# Patient Record
Sex: Female | Born: 1970 | Race: Black or African American | Hispanic: No | Marital: Married | Smoking: Former smoker
Health system: Southern US, Community
[De-identification: ages and names within clinical notes are randomized; demographics above are authoritative.]

## PROBLEM LIST (undated history)

## (undated) DIAGNOSIS — N186 End stage renal disease: Secondary | ICD-10-CM

## (undated) DIAGNOSIS — G510 Bell's palsy: Secondary | ICD-10-CM

## (undated) DIAGNOSIS — I2510 Atherosclerotic heart disease of native coronary artery without angina pectoris: Secondary | ICD-10-CM

## (undated) DIAGNOSIS — E669 Obesity, unspecified: Secondary | ICD-10-CM

## (undated) DIAGNOSIS — I10 Essential (primary) hypertension: Principal | ICD-10-CM

## (undated) DIAGNOSIS — D649 Anemia, unspecified: Secondary | ICD-10-CM

## (undated) DIAGNOSIS — R0602 Shortness of breath: Secondary | ICD-10-CM

## (undated) DIAGNOSIS — E119 Type 2 diabetes mellitus without complications: Secondary | ICD-10-CM

## (undated) DIAGNOSIS — D259 Leiomyoma of uterus, unspecified: Secondary | ICD-10-CM

## (undated) DIAGNOSIS — E11359 Type 2 diabetes mellitus with proliferative diabetic retinopathy without macular edema: Secondary | ICD-10-CM

## (undated) HISTORY — DX: Type 2 diabetes mellitus without complications: E11.9

## (undated) HISTORY — DX: Essential (primary) hypertension: I10

## (undated) HISTORY — DX: Bell's palsy: G51.0

## (undated) HISTORY — PX: UTERINE FIBROID SURGERY: SHX826

## (undated) HISTORY — DX: Obesity, unspecified: E66.9

## (undated) HISTORY — PX: ABDOMINAL HYSTERECTOMY: SHX81

## (undated) HISTORY — DX: Type 2 diabetes mellitus with proliferative diabetic retinopathy without macular edema: E11.359

---

## 1997-05-21 ENCOUNTER — Encounter: Admission: RE | Admit: 1997-05-21 | Discharge: 1997-08-19 | Payer: Self-pay | Admitting: Family Medicine

## 1998-02-19 ENCOUNTER — Encounter: Admission: RE | Admit: 1998-02-19 | Discharge: 1998-02-19 | Payer: Self-pay | Admitting: Obstetrics

## 1998-02-19 ENCOUNTER — Other Ambulatory Visit: Admission: RE | Admit: 1998-02-19 | Discharge: 1998-02-19 | Payer: Self-pay | Admitting: Obstetrics & Gynecology

## 1998-03-08 ENCOUNTER — Ambulatory Visit (HOSPITAL_COMMUNITY): Admission: RE | Admit: 1998-03-08 | Discharge: 1998-03-08 | Payer: Self-pay | Admitting: Obstetrics & Gynecology

## 2000-01-24 HISTORY — PX: ABCESS DRAINAGE: SHX399

## 2000-07-21 ENCOUNTER — Emergency Department (HOSPITAL_COMMUNITY): Admission: EM | Admit: 2000-07-21 | Discharge: 2000-07-21 | Payer: Self-pay | Admitting: Emergency Medicine

## 2000-07-23 ENCOUNTER — Ambulatory Visit (HOSPITAL_COMMUNITY): Admission: AD | Admit: 2000-07-23 | Discharge: 2000-07-23 | Payer: Self-pay

## 2002-04-22 ENCOUNTER — Encounter: Admission: RE | Admit: 2002-04-22 | Discharge: 2002-07-21 | Payer: Self-pay | Admitting: Internal Medicine

## 2002-07-30 ENCOUNTER — Emergency Department (HOSPITAL_COMMUNITY): Admission: EM | Admit: 2002-07-30 | Discharge: 2002-07-30 | Payer: Self-pay | Admitting: Emergency Medicine

## 2003-03-05 ENCOUNTER — Emergency Department (HOSPITAL_COMMUNITY): Admission: EM | Admit: 2003-03-05 | Discharge: 2003-03-05 | Payer: Self-pay | Admitting: Emergency Medicine

## 2003-06-10 ENCOUNTER — Emergency Department (HOSPITAL_COMMUNITY): Admission: EM | Admit: 2003-06-10 | Discharge: 2003-06-10 | Payer: Self-pay

## 2003-07-08 ENCOUNTER — Emergency Department (HOSPITAL_COMMUNITY): Admission: EM | Admit: 2003-07-08 | Discharge: 2003-07-08 | Payer: Self-pay | Admitting: Emergency Medicine

## 2004-01-24 ENCOUNTER — Emergency Department (HOSPITAL_COMMUNITY): Admission: EM | Admit: 2004-01-24 | Discharge: 2004-01-24 | Payer: Self-pay | Admitting: Emergency Medicine

## 2005-04-20 ENCOUNTER — Emergency Department (HOSPITAL_COMMUNITY): Admission: EM | Admit: 2005-04-20 | Discharge: 2005-04-20 | Payer: Self-pay | Admitting: Emergency Medicine

## 2005-04-26 ENCOUNTER — Inpatient Hospital Stay (HOSPITAL_COMMUNITY): Admission: EM | Admit: 2005-04-26 | Discharge: 2005-04-27 | Payer: Self-pay | Admitting: Emergency Medicine

## 2006-08-13 ENCOUNTER — Emergency Department: Payer: Self-pay | Admitting: Emergency Medicine

## 2006-09-24 ENCOUNTER — Emergency Department: Payer: Self-pay | Admitting: Emergency Medicine

## 2006-09-27 ENCOUNTER — Emergency Department: Payer: Self-pay | Admitting: Emergency Medicine

## 2007-11-19 ENCOUNTER — Emergency Department (HOSPITAL_COMMUNITY): Admission: EM | Admit: 2007-11-19 | Discharge: 2007-11-19 | Payer: Self-pay | Admitting: Emergency Medicine

## 2008-01-05 ENCOUNTER — Emergency Department (HOSPITAL_COMMUNITY): Admission: EM | Admit: 2008-01-05 | Discharge: 2008-01-05 | Payer: Self-pay | Admitting: Emergency Medicine

## 2008-03-16 ENCOUNTER — Ambulatory Visit: Payer: Self-pay | Admitting: Internal Medicine

## 2008-03-16 DIAGNOSIS — E669 Obesity, unspecified: Secondary | ICD-10-CM

## 2008-03-16 DIAGNOSIS — E119 Type 2 diabetes mellitus without complications: Secondary | ICD-10-CM

## 2008-03-16 HISTORY — DX: Obesity, unspecified: E66.9

## 2008-03-16 HISTORY — DX: Type 2 diabetes mellitus without complications: E11.9

## 2008-04-15 ENCOUNTER — Encounter: Admission: RE | Admit: 2008-04-15 | Discharge: 2008-04-15 | Payer: Self-pay | Admitting: Internal Medicine

## 2008-04-16 ENCOUNTER — Encounter: Payer: Self-pay | Admitting: Internal Medicine

## 2008-04-17 ENCOUNTER — Ambulatory Visit: Payer: Self-pay | Admitting: Internal Medicine

## 2008-04-17 LAB — CONVERTED CEMR LAB: Blood Glucose, Fingerstick: 148

## 2008-05-12 ENCOUNTER — Ambulatory Visit: Payer: Self-pay | Admitting: Family Medicine

## 2008-05-12 DIAGNOSIS — G510 Bell's palsy: Secondary | ICD-10-CM | POA: Insufficient documentation

## 2008-05-12 HISTORY — DX: Bell's palsy: G51.0

## 2008-05-13 ENCOUNTER — Telehealth: Payer: Self-pay | Admitting: Internal Medicine

## 2008-05-14 ENCOUNTER — Encounter: Payer: Self-pay | Admitting: Internal Medicine

## 2008-05-14 ENCOUNTER — Telehealth: Payer: Self-pay | Admitting: Internal Medicine

## 2009-03-10 ENCOUNTER — Ambulatory Visit: Payer: Self-pay | Admitting: Internal Medicine

## 2009-03-10 LAB — CONVERTED CEMR LAB: Blood Glucose, Fingerstick: 161

## 2009-04-02 ENCOUNTER — Telehealth: Payer: Self-pay | Admitting: Internal Medicine

## 2009-04-23 ENCOUNTER — Ambulatory Visit: Payer: Self-pay | Admitting: Endocrinology

## 2009-04-23 DIAGNOSIS — E113599 Type 2 diabetes mellitus with proliferative diabetic retinopathy without macular edema, unspecified eye: Secondary | ICD-10-CM | POA: Insufficient documentation

## 2009-04-23 DIAGNOSIS — E11359 Type 2 diabetes mellitus with proliferative diabetic retinopathy without macular edema: Secondary | ICD-10-CM

## 2009-04-23 HISTORY — DX: Type 2 diabetes mellitus with proliferative diabetic retinopathy without macular edema: E11.359

## 2009-04-23 LAB — CONVERTED CEMR LAB: Fructosamine: 206 micromoles/L (ref <285)

## 2009-04-30 ENCOUNTER — Telehealth: Payer: Self-pay | Admitting: Endocrinology

## 2009-05-31 ENCOUNTER — Telehealth: Payer: Self-pay | Admitting: Internal Medicine

## 2009-06-29 ENCOUNTER — Ambulatory Visit: Payer: Self-pay | Admitting: Internal Medicine

## 2009-06-29 DIAGNOSIS — R609 Edema, unspecified: Secondary | ICD-10-CM | POA: Insufficient documentation

## 2009-06-29 LAB — CONVERTED CEMR LAB: Blood Glucose, Fingerstick: 207

## 2009-07-06 ENCOUNTER — Ambulatory Visit: Payer: Self-pay | Admitting: Internal Medicine

## 2009-07-06 DIAGNOSIS — I8310 Varicose veins of unspecified lower extremity with inflammation: Secondary | ICD-10-CM | POA: Insufficient documentation

## 2009-08-04 ENCOUNTER — Telehealth: Payer: Self-pay | Admitting: Internal Medicine

## 2009-09-20 ENCOUNTER — Telehealth: Payer: Self-pay | Admitting: Internal Medicine

## 2009-11-30 ENCOUNTER — Ambulatory Visit: Payer: Self-pay | Admitting: Internal Medicine

## 2009-12-18 ENCOUNTER — Emergency Department (HOSPITAL_COMMUNITY): Admission: EM | Admit: 2009-12-18 | Discharge: 2009-12-18 | Payer: Self-pay | Admitting: Emergency Medicine

## 2009-12-20 ENCOUNTER — Inpatient Hospital Stay (HOSPITAL_COMMUNITY)
Admission: AD | Admit: 2009-12-20 | Discharge: 2009-12-20 | Payer: Self-pay | Source: Home / Self Care | Admitting: Obstetrics & Gynecology

## 2009-12-20 ENCOUNTER — Telehealth: Payer: Self-pay | Admitting: Internal Medicine

## 2009-12-21 ENCOUNTER — Inpatient Hospital Stay (HOSPITAL_COMMUNITY): Admission: AD | Admit: 2009-12-21 | Discharge: 2009-12-21 | Payer: Self-pay | Admitting: Obstetrics

## 2009-12-29 ENCOUNTER — Other Ambulatory Visit
Admission: RE | Admit: 2009-12-29 | Discharge: 2009-12-29 | Payer: Self-pay | Source: Home / Self Care | Admitting: Gynecologic Oncology

## 2009-12-29 ENCOUNTER — Ambulatory Visit
Admission: RE | Admit: 2009-12-29 | Discharge: 2009-12-29 | Payer: Self-pay | Source: Home / Self Care | Admitting: Gynecologic Oncology

## 2010-01-06 ENCOUNTER — Encounter: Payer: Self-pay | Admitting: Internal Medicine

## 2010-01-06 ENCOUNTER — Ambulatory Visit: Payer: Self-pay | Admitting: Internal Medicine

## 2010-01-06 DIAGNOSIS — I10 Essential (primary) hypertension: Secondary | ICD-10-CM | POA: Insufficient documentation

## 2010-01-06 HISTORY — DX: Essential (primary) hypertension: I10

## 2010-01-06 LAB — CONVERTED CEMR LAB: Blood Glucose, Fingerstick: 328

## 2010-01-07 ENCOUNTER — Ambulatory Visit: Payer: Self-pay | Admitting: Internal Medicine

## 2010-01-10 LAB — CONVERTED CEMR LAB: Hgb A1c MFr Bld: 7.9 % — ABNORMAL HIGH (ref 4.6–6.5)

## 2010-01-12 ENCOUNTER — Ambulatory Visit: Payer: Self-pay | Admitting: Internal Medicine

## 2010-01-13 ENCOUNTER — Ambulatory Visit: Payer: Self-pay | Admitting: Internal Medicine

## 2010-01-13 ENCOUNTER — Encounter (INDEPENDENT_AMBULATORY_CARE_PROVIDER_SITE_OTHER): Payer: Self-pay | Admitting: Obstetrics and Gynecology

## 2010-01-13 ENCOUNTER — Inpatient Hospital Stay (HOSPITAL_COMMUNITY)
Admission: RE | Admit: 2010-01-13 | Discharge: 2010-01-15 | Payer: Self-pay | Source: Home / Self Care | Attending: Obstetrics and Gynecology | Admitting: Obstetrics and Gynecology

## 2010-01-23 HISTORY — PX: APPENDECTOMY: SHX54

## 2010-02-18 NOTE — Discharge Summary (Signed)
  NAME:  Lori Greer, MARMO NO.:  [id number]  MEDICAL RECORD NO.:  [id number]          PATIENT TYPE:  INP  LOCATION:  9304                          FACILITY:  [facility]  PHYSICIAN:  Marlinda Mike, C.N.M.   DATE OF BIRTH:  05/18/1970  DATE OF ADMISSION:  01/13/2010 DATE OF DISCHARGE:  01/15/2010                              DISCHARGE SUMMARY   DIAGNOSES:  Menorrhagia, uterine fibroids, pelvic pain, type 2 diabetes mellitus, chronic hypertension, and morbid obesity.  DISCHARGE DIAGNOSES:  Postoperative day #2 status post total abdominal hysterectomy, diabetes mellitus type 2, chronic hypertension stable, morbid obesity.  PATIENT HISTORY:  The patient is a 40 year old gravida 6, para 5 with a history of chronic menorrhagia and pelvic pain related to fibroids, ultrasound reveals enlarged uterus with multiple fibroids, with possibility of fibroid at 5 cm in size.  The patient's medical history is significant for type 2 diabetes, chronic hypertension, and morbid obesity.  PAST SURGICAL HISTORY:  Cesarean section x1, bilateral tubal sterilization, and spontaneous vaginal delivery x5.  PATIENT PRESENTATION:  The patient presents for surgery on day of admission.  The patient undergoes total abdominal hysterectomy by Dr. Billy Coast on January 13, 2010, see operative note for details.  Preop hemoglobin 11.6, hematocrit 32.2, and platelet count of 362.  POSTOPERATIVE COURSE:  The patient's postoperative course was uneventful.  Postoperative CBC, white blood cell count 10.4, hemoglobin 10.1, hematocrit 28.3, and a platelet count of 268.  The patient is seen daily by the medical service for management of chronic hypertension and diabetes. The patient with history of uncontrolled blood sugars outside of hospital achieved  improved glycemic control during hospitalization.  Vital signs remain stable with  improved blood pressure and control with current medication regimine.  The patient  remained afebrile during hospitalization. Physical exam is  within normal limits.  Incision is well approximated with staples.  No erythema, no drainage, no ecchymosis.   The patient is discharged home in stable condition on postoperative day 2.  DISCHARGE INSTRUCTIONS:  Diet, low-carb diet, low-sodium diet.  Activity per postoperative restrictions x2 weeks.  No driving a car for 2 weeks. No sexual activity for 4 weeks and no lifting for 4 weeks.  MEDICATIONS AT TIME OF DISCHARGE:  Amaryl 1 mg p.o. b.i.d., Byetta injections subcu 5 units b.i.d., Norvasc 10 mg p.o. daily, hydrochlorothiazide 25 mg tab p.o. daily, labetalol 100 mg p.o. b.i.d., lisinopril 20 mg p.o. b.i.d., and Percocet 1-2 tablets every 4 hours as needed for pain p.r.n.  The patient is to follow up at [facility] for staple removal on December 28 or December 29 per patient's choice. Instruected to call for appointment time for follow up with her primary medical physician.     Marlinda Mike, C.N.M.     TB/MEDQ  D:  02/03/2010  T:  02/04/2010  Job:  [id number]  Electronically Signed by Marlinda Mike C.N.M. on 02/07/2010 10:36:59 PM Electronically Signed by Olivia Mackie M.D. on 02/18/2010 02:23:00 PM

## 2010-02-24 NOTE — Progress Notes (Signed)
Summary: Pt is req a referral for a Diabetes Specialist  Phone Note Call from Patient Call back at [phone] cell   Caller: Patient Summary of Call: Pt called and is req a referral for a diabetes specialist. Please call back with Dr info and name of practice.  Initial call taken by: Lucy Antigua,  April 02, 2009 4:03 PM  Follow-up for Phone Call        Dr Everardo All Follow-up by: Gordy Savers  MD,  April 02, 2009 5:13 PM  Additional Follow-up for Phone Call Additional follow up Details #1::        infor to terri Additional Follow-up by: Duard Brady LPN,  April 05, 2009 9:33 AM

## 2010-02-24 NOTE — Progress Notes (Signed)
Summary: lost lasix rx - requesting new one  Phone Note Refill Request   Refills Requested: Medication #1:  FUROSEMIDE 40 MG TABS one daily prn. Initial call taken by: Kathrynn Speed CMA,  September 20, 2009 2:40 PM Caller: Patient Call For: Gordy Savers  MD Summary of Call: [facility]) Pt. lost Lasix prescription, and needs a new one. Initial call taken by: Lynann Beaver CMA,  September 20, 2009 11:24 AM    Prescriptions: FUROSEMIDE 40 MG TABS (FUROSEMIDE) one daily prn  #90 x 6   Entered by:   Kathrynn Speed CMA   Authorized by:   Gordy Savers  MD   Signed by:   Kathrynn Speed CMA on 09/20/2009   Method used:   Electronically to        [facility] (retail)       [address]       [place], [place]  [address]       Ph: [phone]       Fax: [phone]   RxID:   [id number]   Appended Document: lost lasix rx - requesting new one rec'd msg from pt - inquiring about rx request from monday - I called [facility] - they have rx ready for pick up  - attempted to return call to pt - ans mach - LMTCB if questions - rx was filled monday - ready for p/u   KIK

## 2010-02-24 NOTE — Progress Notes (Signed)
Summary: SAMPLES   Phone Note Call from Patient Call back at [phone]   Caller: pt live Call For: K Summary of Call: PATIENT IS AT THE PHARMACY AND THE MEDICINE DR K PRECRIBED IS TO EXPENSIVE VYETTA.  PATIENT WOULD LIKE SAMPLES OF VYETTA 10MG  2 PILLS ADAY AND GLYBURIDE 4MG  1PILL DAY.  [phone].  SHE CANNOT AFORD THESE 2 MEDICINE. Initial call taken by: Celine Ahr,  May 13, 2008 10:43 AM  Follow-up for Phone Call        No samples Byetta; glyburide or glimepiride are $4/month Follow-up by: Gordy Savers  MD,  May 14, 2008 1:26 PM      Appended Document: SAMPLES  called and LMOM.

## 2010-02-24 NOTE — Progress Notes (Signed)
Summary: WORK NOTE  Phone Note Call from Patient Call back at Home Phone [phone]   Caller: Patient Call For: DR K Summary of Call: PT WOULD LIKE A WORK NOTE WITH NO RESTRICTION TO RETURN TO WORK ON 05-18-2008 Initial call taken by: Heron Sabins,  May 13, 2008 11:30 AM

## 2010-02-24 NOTE — Assessment & Plan Note (Signed)
Summary: med check/refill/cjr   Vital Signs:  Greer profile:   40 year old female Weight:      213 pounds Temp:     98.4 degrees F oral BP sitting:   148 / 90  (right arm) Cuff size:   regular  Vitals Entered By: Duard Brady LPN (March 10, 2009 11:48 AM) CC: medication review -   CBG Result 161   CC:  medication review -  .  History of Present Illness: Lori Greer history type 2 diabetes, CNA, who is in today for follow up of her diabetes.  She has been out of all her medications and states that her insurance plan does not cover Byetta. Blood sugars are consistently over 200.  She is not been seen here in a number of months.  She generally feels well without focal complaints.  Denies any hyperglycemic symptoms  Allergies: No Known Drug Allergies  Past History:  Past Medical History: Diabetes mellitus, type II obesity  Past Surgical History: Reviewed history from 03/16/2008 and no changes required. gravida 5, para 5 abortus zero  Review of Systems  The Greer denies anorexia, fever, weight loss, weight gain, vision loss, decreased hearing, hoarseness, chest pain, syncope, dyspnea on exertion, peripheral edema, prolonged cough, headaches, hemoptysis, abdominal pain, melena, hematochezia, severe indigestion/heartburn, hematuria, incontinence, genital sores, muscle weakness, suspicious skin lesions, transient blindness, difficulty walking, depression, unusual weight change, abnormal bleeding, enlarged lymph nodes, angioedema, and breast masses.    Physical Exam  General:  overweight-appearing.  probable pressure Head:  Normocephalic and atraumatic without obvious abnormalities. No apparent alopecia or balding. Eyes:  No corneal or conjunctival inflammation noted. EOMI. Perrla. Funduscopic exam benign, without hemorrhages, exudates or papilledema. Vision grossly normal. Neck:  No deformities, masses, or tenderness noted. Lungs:  Normal respiratory effort,  chest expands symmetrically. Lungs are clear to auscultation, no crackles or wheezes. Heart:  Normal rate and regular rhythm. S1 and S2 normal without gallop, murmur, click, rub or other extra sounds. Msk:  No deformity or scoliosis noted of thoracic or lumbar spine.   Pulses:  R and L carotid,radial,femoral,dorsalis pedis and posterior tibial pulses are full and equal bilaterally Extremities:  No clubbing, cyanosis, edema, or deformity noted with normal full range of motion of all joints.    Diabetes Management Exam:    Eye Exam:       Eye Exam done here today          Results: normal   Impression & Recommendations:  Problem # 1:  DIABETES MELLITUS, TYPE II (ICD-250.00)  Her updated medication list for this problem includes:    Metformin Hcl 1000 Mg Tabs (Metformin hcl) ..... One twice daily    Glimepiride 4 Mg Tabs (Glimepiride) ..... One daily    Byetta 10 Mcg Pen 10 Mcg/0.23ml Soln (Exenatide) ..... Use  twice daily    Victoza 18 Mg/5ml Soln (Liraglutide) .[name]... 0.6 mg daily  Orders: Capillary Blood Glucose/CBG (16109)  will check with her drug benefit  Plan for coverage.  Compliance issues addressed.  Will recheck in one month  Problem # 2:  OBESITY (ICD-278.00) diet exercise, weight loss encouraged.  She has been counseled at the [facility] weight-loss.  Proper diet.  Encouraged  Complete Medication List: 1)  Metformin Hcl 1000 Mg Tabs (Metformin hcl) .... One twice daily 2)  Glimepiride 4 Mg Tabs (Glimepiride) .... One daily 3)  Byetta 10 Mcg Pen 10 Mcg/0.44ml Soln (Exenatide) .... Use  twice daily 4)  Bd Ultra-fine  Pen Needles 29g X 12.50mm Misc (Insulin pen needle) .... As directed twice daily 5)  Victoza 18 Mg/43ml Soln (Liraglutide) .... 0.6 mg daily 6)  Pen Needles 31g X 8 Mm Misc (Insulin pen needle) .... Use daily  Greer Instructions: 1)  Check your blood sugars regularly. If your readings are usually above : or below 70 you should contact our  office. 2)  It is important that your Diabetic A1c level is checked every 3 months. 3)  See your eye doctor yearly to check for diabetic eye damage. 4)  Please schedule a follow-up appointment in 1 month. Prescriptions: PEN NEEDLES 31G X 8 MM MISC (INSULIN PEN NEEDLE) use daily  #180 x 6   Entered and Authorized by:   Gordy Savers  MD   Signed by:   Gordy Savers  MD on 03/10/2009   Method used:   Print then Give to Greer   RxID:   [id number] MVHQION 18 MG/3ML SOLN (LIRAGLUTIDE) 0.6 mg daily  #3 x 6   Entered and Authorized by:   Gordy Savers  MD   Signed by:   Gordy Savers  MD on 03/10/2009   Method used:   Print then Give to Greer   RxID:   [id number] BD ULTRA-FINE PEN NEEDLES 29G X 12.7MM MISC (INSULIN PEN NEEDLE) as directed twice daily  #180 x 6   Entered and Authorized by:   Gordy Savers  MD   Signed by:   Gordy Savers  MD on 03/10/2009   Method used:   Print then Give to Greer   RxID:   [id number] BYETTA 10 MCG PEN 10 MCG/0.04ML SOLN (EXENATIDE) use  twice daily  #3 months x 6   Entered and Authorized by:   Gordy Savers  MD   Signed by:   Gordy Savers  MD on 03/10/2009   Method used:   Print then Give to Greer   RxID:   [id number] GLIMEPIRIDE 4 MG TABS (GLIMEPIRIDE) one daily  #90 x 6   Entered and Authorized by:   Gordy Savers  MD   Signed by:   Gordy Savers  MD on 03/10/2009   Method used:   Print then Give to Greer   RxID:   [id number] METFORMIN HCL 1000 MG TABS (METFORMIN HCL) one twice daily  #180 x 6   Entered and Authorized by:   Gordy Savers  MD   Signed by:   Gordy Savers  MD on 03/10/2009   Method used:   Print then Give to Greer   RxID:   [id number]

## 2010-02-24 NOTE — Assessment & Plan Note (Signed)
Summary: BILATERAL FOOT PAIN // RS   Vital Signs:  Patient profile:   40 year old female Weight:      201 pounds Temp:     98.5 degrees F oral BP sitting:   124 / 72  (left arm) Cuff size:   regular  Vitals Entered By: Duard Brady LPN (July 06, 2009 4:05 PM) CC: c/o foot discoloration        fbs 140 Is Patient Diabetic? Yes Did you bring your meter with you today? No   CC:  c/o foot discoloration        fbs 140.  History of Present Illness: 40 year old patient who has had a problem with the edema and was seen here one week ago.  She was placed on furosemide 40 mg in her pedal edema has largely resolved.  Her weight is down 10 pounds.  She is complaining of some mild discoloration involving the lateral aspect of both feet  Allergies (verified): No Known Drug Allergies  Physical Exam  General:  Well-developed,well-nourished,in no acute distress; alert,appropriate and cooperative throughout examination Extremities:  no peripheral edema; mild stasis dermatitis is noted involving both lateral feet   Impression & Recommendations:  Problem # 1:  STASIS DERMATITIS (ICD-454.1)  Problem # 2:  PEDAL EDEMA (ICD-782.3)  Her updated medication list for this problem includes:    Furosemide 40 Mg Tabs (Furosemide) ..... One daily prn  Complete Medication List: 1)  Bd Ultra-fine Pen Needles 29g X 12.36mm Misc (Insulin pen needle) .... As directed twice daily 2)  Pen Needles 31g X 8 Mm Misc (Insulin pen needle) .... Use daily 3)  Byetta 5 Mcg Pen 5 Mcg/0.16ml Soln (Exenatide) .... 5 micrograms two times a day 4)  Glimepiride 1 Mg Tabs (Glimepiride) .[name].. 1 each am 5)  Accu-chek Aviva Strp (Glucose blood) 6)  Accu-chek Multiclix Lancets Misc (Lancets) 7)  Furosemide 40 Mg Tabs (Furosemide) .... One daily prn  Patient Instructions: 1)  Limit your Sodium (Salt). 2)  Please schedule a follow-up appointment as needed.

## 2010-02-24 NOTE — Progress Notes (Signed)
Summary: Byetta  Phone Note Other Incoming   Summary of Call: FYI--I finally got somewhere with [employer] and Byetta is covered, no prior authorization needed. Initial call taken by: Lucious Groves,  April 30, 2009 4:53 PM  Follow-up for Phone Call        please advise pt, thank you Follow-up by: Minus Breeding MD,  April 30, 2009 4:54 PM  Additional Follow-up for Phone Call Additional follow up Details #1::        left message on machine to call back to office. Additional Follow-up by: Lucious Groves,  May 03, 2009 8:16 AM    Additional Follow-up for Phone Call Additional follow up Details #2::    Patient notified, and states that she cannot afford the medication. Per the patient she has one month left of the med and will call the office back when she decides if she would like to continue on it or not. Lucious Groves,  May 06, 2009 9:37 AM

## 2010-02-24 NOTE — Assessment & Plan Note (Signed)
Summary: pre surg/dm   Vital Signs:  Patient profile:   40 year old female Weight:      181 pounds Temp:     98.3 degrees F oral BP sitting:   120 / 80  (right arm) Cuff size:   regular  Vitals Entered By: Duard Brady LPN (January 12, 2010 10:43 AM) CC: pre-surg. -e/u bp and bloodsugars      fbs 117 Is Patient Diabetic? Yes Did you bring your meter with you today? No   CC:  pre-surg. -e/u bp and bloodsugars      fbs 117.  History of Present Illness: 40 -year-old patient who is seen today in follow-up.  She is scheduled for elective hysterectomy and has had some preoperative hypertension.  Last visit.  She was started on antihypertensive medication, which he tolerates well.  She is on lisinopril, hydrochlorothiazide.  Blood pressure has been nicely controlled.  she has type 2 diabetes, which has been stable.she also has a history of exogenous obesity.  Allergies (verified): No Known Drug Allergies  Past History:  Past Medical History: Reviewed history from 01/06/2010 and no changes required. Diabetes mellitus, type II obesity Hypertension  Review of Systems  The patient denies anorexia, fever, weight loss, weight gain, vision loss, decreased hearing, hoarseness, chest pain, syncope, dyspnea on exertion, peripheral edema, prolonged cough, headaches, hemoptysis, abdominal pain, melena, hematochezia, severe indigestion/heartburn, hematuria, incontinence, genital sores, muscle weakness, suspicious skin lesions, transient blindness, difficulty walking, depression, unusual weight change, abnormal bleeding, enlarged lymph nodes, angioedema, and breast masses.    Physical Exam  General:  Well-developed,well-nourished,in no acute distress; alert,appropriate and cooperative throughout examination; blood pressure is 120/80 Neck:  No deformities, masses, or tenderness noted. Lungs:  Normal respiratory effort, chest expands symmetrically. Lungs are clear to auscultation, no  crackles or wheezes. Heart:  Normal rate and regular rhythm. S1 and S2 normal without gallop, murmur, click, rub or other extra sounds.   Impression & Recommendations:  Problem # 1:  HYPERTENSION (ICD-401.9)  Her updated medication list for this problem includes:    Furosemide 40 Mg Tabs (Furosemide) ..... One daily prn    Lisinopril-hydrochlorothiazide 20-12.5 Mg Tabs (Lisinopril-hydrochlorothiazide) ..... One daily  Problem # 2:  DIABETES MELLITUS, TYPE II (ICD-250.00)  Complete Medication List: 1)  Bd Ultra-fine Pen Needles 29g X 12.42mm Misc (Insulin pen needle) .... As directed twice daily 2)  Pen Needles 31g X 8 Mm Misc (Insulin pen needle) .... Use daily 3)  Byetta 5 Mcg Pen 5 Mcg/0.84ml Soln (Exenatide) .... 5 micrograms two times a day 4)  Glimepiride 1 Mg Tabs (Glimepiride) .[name].. 1 each am 5)  Accu-chek Aviva Strp (Glucose blood) 6)  Accu-chek Multiclix Lancets Misc (Lancets) 7)  Furosemide 40 Mg Tabs (Furosemide) .... One daily prn 8)  Ed K+10 10 Meq Cr-tabs (Potassium chloride) .... One twice daily 9)  Phentermine Hcl 37.5 Mg Caps (Phentermine hcl) .... One daily 10)  Lisinopril-hydrochlorothiazide 20-12.5 Mg Tabs (Lisinopril-hydrochlorothiazide) .... One daily 11)  Metformin Hcl 500 Mg Xr24h-tab (Metformin hcl) .... Two  once daily  Patient Instructions: 1)  Limit your Sodium (Salt). 2)  It is important that you exercise regularly at least 20 minutes 5 times a week. If you develop chest pain, have severe difficulty breathing, or feel very tired , stop exercising immediately and seek medical attention. 3)  You need to lose weight. Consider a lower calorie diet and regular exercise.  4)  Check your blood sugars regularly. If your readings are usually above :  or below 70 you should contact our office. 5)  It is important that your Diabetic A1c level is checked every 3 months. 6)  See your eye doctor yearly to check for diabetic eye damage. 7)  Please schedule a follow-up  appointment in 3 months.   Orders Added: 1)  Est. Patient Level III [21308]

## 2010-02-24 NOTE — Progress Notes (Signed)
Summary: wgt loss  Phone Note Call from Patient   Caller: Patient Call For: Gordy Savers  MD Summary of Call: Pt is asking for a med for weight loss??? [phone] Initial call taken by: Lynann Beaver CMA,  August 04, 2009 9:53 AM  Follow-up for Phone Call        needs to follow diabetic diet and exercise; Follow-up by: Gordy Savers  MD,  August 04, 2009 12:23 PM  Additional Follow-up for Phone Call Additional follow up Details #1::        Phone Call Completed.   Left message. Additional Follow-up by: Rudy Jew, RN,  August 04, 2009 1:36 PM

## 2010-02-24 NOTE — Assessment & Plan Note (Signed)
Summary: feet swelling/dm   Vital Signs:  Patient profile:   40 year old female Weight:      208 pounds Temp:     98.4 degrees F oral BP sitting:   140 / 94  (right arm) Cuff size:   regular  Vitals Entered By: Duard Brady LPN (November 30, 2009 4:30 PM) CC: c/o legs and feet still with swelling - using furosemide qd and bid per self    fbs 124 Is Patient Diabetic? Yes Did you bring your meter with you today? No   CC:  c/o legs and feet still with swelling - using furosemide qd and bid per self    fbs 124.  History of Present Illness: 40 year old patient who has had some difficulties with intermittent pedal edema.  Since her last visit here.  She has had increasing lower extremity swelling.  Distal to the knees.  She has increased her furosemide from p.r.n. use to b.i.d..  She does consume high quantities of sodium.  She has type 2 diabetes.  Blood pressure has been labile, but when her fluid volume is controlled.  Blood pressures are usually in a low normal range  Allergies (verified): No Known Drug Allergies  Past History:  Past Medical History: Reviewed history from 03/10/2009 and no changes required. Diabetes mellitus, type II obesity  Review of Systems       The patient complains of peripheral edema.  The patient denies anorexia, fever, weight loss, weight gain, vision loss, decreased hearing, hoarseness, chest pain, syncope, dyspnea on exertion, prolonged cough, headaches, hemoptysis, abdominal pain, melena, hematochezia, severe indigestion/heartburn, hematuria, incontinence, genital sores, muscle weakness, suspicious skin lesions, transient blindness, difficulty walking, depression, unusual weight change, abnormal bleeding, enlarged lymph nodes, angioedema, and breast masses.    Physical Exam  General:  overweight-appearing.  140/90 Head:  Normocephalic and atraumatic without obvious abnormalities. No apparent alopecia or balding. Mouth:  Oral mucosa and  oropharynx without lesions or exudates.  Teeth in good repair. Neck:  No deformities, masses, or tenderness noted. Lungs:  Normal respiratory effort, chest expands symmetrically. Lungs are clear to auscultation, no crackles or wheezes. O2 saturation 98 Heart:  Normal rate and regular rhythm. S1 and S2 normal without gallop, murmur, click, rub or other extra sounds. Extremities:  2+ left pedal edema and 2+ right pedal edema.     Impression & Recommendations:  Problem # 1:  PEDAL EDEMA (ICD-782.3)  Her updated medication list for this problem includes:    Furosemide 40 Mg Tabs (Furosemide) ..... One daily prn salt restrictions discussed at length  Problem # 2:  DIABETES MELLITUS, TYPE II (ICD-250.00)  Her updated medication list for this problem includes:    Byetta 5 Mcg Pen 5 Mcg/0.44ml Soln (Exenatide) .Marland KitchenMarland KitchenMarland KitchenMarland Kitchen 5 micrograms two times a day    Glimepiride 1 Mg Tabs (Glimepiride) .[name]... 1 each am  Problem # 3:  OBESITY (ICD-278.00) the patient has been on phentermine in the past, and she requests a refill.  She was told that this is not for chronic use.  She states it has been very effective in the past as far as reducing her appetite.  Complete Medication List: 1)  Bd Ultra-fine Pen Needles 29g X 12.44mm Misc (Insulin pen needle) .... As directed twice daily 2)  Pen Needles 31g X 8 Mm Misc (Insulin pen needle) .... Use daily 3)  Byetta 5 Mcg Pen 5 Mcg/0.74ml Soln (Exenatide) .... 5 micrograms two times a day 4)  Glimepiride 1 Mg Tabs (  Glimepiride) .[name].. 1 each am 5)  Accu-chek Aviva Strp (Glucose blood) 6)  Accu-chek Multiclix Lancets Misc (Lancets) 7)  Furosemide 40 Mg Tabs (Furosemide) .... One daily prn 8)  Ed K+10 10 Meq Cr-tabs (Potassium chloride) .... One twice daily 9)  Phentermine Hcl 37.5 Mg Caps (Phentermine hcl) .... One daily  Patient Instructions: 1)  Please schedule a follow-up appointment in 1 month. 2)  Limit your Sodium (Salt). 3)  It is important that you exercise  regularly at least 20 minutes 5 times a week. If you develop chest pain, have severe difficulty breathing, or feel very tired , stop exercising immediately and seek medical attention. 4)  You need to lose weight. Consider a lower calorie diet and regular exercise.  5)  Check your blood sugars regularly. If your readings are usually above : or below 70 you should contact our office. 6)  It is important that your Diabetic A1c level is checked every 3 months. 7)  See your eye doctor yearly to check for diabetic eye damage. Prescriptions: PHENTERMINE HCL 37.5 MG CAPS (PHENTERMINE HCL) one daily  #60 x 1   Entered and Authorized by:   Gordy Savers  MD   Signed by:   Gordy Savers  MD on 11/30/2009   Method used:   Print then Give to Patient   RxID:   [id number] ED K+10 10 MEQ CR-TABS (POTASSIUM CHLORIDE) one twice daily  #90 x 6   Entered and Authorized by:   Gordy Savers  MD   Signed by:   Gordy Savers  MD on 11/30/2009   Method used:   Print then Give to Patient   RxID:   [id number] FUROSEMIDE 40 MG TABS (FUROSEMIDE) one daily prn  #90 x 6   Entered and Authorized by:   Gordy Savers  MD   Signed by:   Gordy Savers  MD on 11/30/2009   Method used:   Print then Give to Patient   RxID:   [id number]    Orders Added: 1)  Est. Patient Level IV [64403]

## 2010-02-24 NOTE — Assessment & Plan Note (Signed)
Summary: NEW ENDO CON / DM/ NWS  #   Vital Signs:  Patient profile:   40 year old female Height:      62 inches (157.48 cm) Weight:      205.25 pounds (93.30 kg) BMI:     37.68 O2 Sat:      99 % on Room air Temp:     98.4 degrees F (36.89 degrees C) oral Pulse rate:   102 / minute BP sitting:   118 / 74  (left arm) Cuff size:   regular  Vitals Entered By: Josph Macho RMA (April 23, 2009 2:35 PM)  O2 Flow:  Room air CC: New Endo/ pt states she is no longer take Metformin or Byetta/ CF Is Patient Diabetic? Yes   Referring Provider:  Amador Cunas  CC:  New Endo/ pt states she is no longer take Metformin or Byetta/ CF.  History of Present Illness: pt states 12 years h/o dm, complicated by retinopathy.  she has never been on insulin.  she takes victoza and amaryl.   on further questioning, she says she has taken both byetta and victoza, and prefers the byetta.  no cbg record, but states cbg's are well-controlled, for the past 6 weeks.  she had only 1 episode of hypoglycemia (approx 70). pt says her diet and exercise are "now good."   symptomatically, pt states approx 1 year of slight "sensation," of the feet, but no associated pain.   Current Medications (verified): 1)  Metformin Hcl 1000 Mg Tabs (Metformin Hcl) .... One Twice Daily 2)  Glimepiride 4 Mg Tabs (Glimepiride) .... One Daily 3)  Byetta 10 Mcg Pen 10 Mcg/0.1ml Soln (Exenatide) .... Use  Twice Daily 4)  Bd Ultra-Fine Pen Needles 29g X 12.61mm Misc (Insulin Pen Needle) .... As Directed Twice Daily 5)  Victoza 18 Mg/76ml Soln (Liraglutide) .... 0.6 Mg Daily 6)  Pen Needles 31g X 8 Mm Misc (Insulin Pen Needle) .... Use Daily  Allergies (verified): No Known Drug Allergies  Past History:  Past Medical History: Last updated: 03/10/2009 Diabetes mellitus, type II obesity  Family History: Reviewed history from 03/16/2008 and no changes required. father is 1.  History type 2 diabetes mother is 42, history of  coronary artery disease, prior MI 4 brothers 4 sisters-positive for diabetes  Social History: Reviewed history from 03/16/2008 and no changes required. 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  she reports blurry vision.  she has lost weight, due to her efforts.  she has intermittent mild headache, and polyuria.  she has regular menses.   Physical Exam  General:  obese.   Head:  head: no deformity eyes: no periorbital swelling, no proptosis external nose and ears are normal mouth: no lesion seen Neck:  Supple without thyroid enlargement or tenderness.  Lungs:  Clear to auscultation bilaterally. Normal respiratory effort.  Heart:  Regular rate and rhythm without murmurs or gallops noted. Normal S1,S2.   Msk:  muscle bulk and strength are grossly normal.  no obvious joint swelling.  gait is normal and steady  Pulses:  dorsalis pedis intact bilat.  no carotid bruit  Extremities:  no deformity.  no ulcer on the feet.  feet are of normal color and temp.  no edema  Neurologic:  cn 2-12 grossly intact.   readily moves all 4's.   sensation is  intact to touch on the feet  Skin:  normal texture and temp.  no rash.  not diaphoretic  Cervical Nodes:  No significant adenopathy.  Psych:  Alert and cooperative; normal mood and affect; normal attention span and concentration.   Additional Exam:  Fructosamine              206 umol/L  (converts to a1c in the high 5's).   Impression & Recommendations:  Problem # 1:  DIABETES MELLITUS, TYPE II (ICD-250.00) she can reduce amaryl in viwew of this fructosamine  Problem # 2:  foot sxs ? neuropathic  Problem # 3:  weight loss due to her efforts, but could be exac by byetta/victoza  Medications Added to Medication List This Visit: 1)  Byetta 5 Mcg Pen 5 Mcg/0.84ml Soln (Exenatide) .... 5 micrograms two  times a day 2)  Glimepiride 1 Mg Tabs (Glimepiride) .[name].. 1 each am  Other Orders: T-Fructosamine (13086-57846) Consultation Level IV (96295)  Patient Instructions: 1)  we discussed the importance of diet and exercise therapy and the risks of diabetes.  you should see an eye doctor every year. 2)  we will need to take this complex situation in stages 3)  it is very important to keep good control of blood pressure and cholesterol, especially in those with diabetes.  stopping smoking also reduces the damage diabetes does to your body.  please discuss these with your doctor.  you should take an aspirin every day, unless you have been advised by a doctor not to. 4)  check your blood sugar 1-2 times a day.  vary the time of day when you check, between before the 3 meals, and at bedtime.  also check if you have symptoms of your blood sugar being too high or too low.  please keep a record of the readings and bring it to your next appointment here.  please call us sooner if you are having low blood sugar episodes. 5)  tests are being ordered for you today.  a few days after the test(s), please call [phone] to hear your test results. 6)  pending the test results, please continue the same medications for now. 7)  Please schedule a follow-up appointment in 3 months. 8)  (update: i left message on phone-tree:  take byetta 5 micrograms two times a day, and reduce amaryl to 1 mg each am).  i'll do the prior auth for byetta, if you want to take instead of the victoza.

## 2010-02-24 NOTE — Assessment & Plan Note (Signed)
Vital Signs:  Patient profile:   40 year old female Weight:      183 pounds Temp:     99.2 degrees F oral BP sitting:   150 / 100  (right arm) Cuff size:   regular  Vitals Entered By: Duard Brady LPN (January 06, 2010 1:06 PM) CC: high blood pressure concerns Is Patient Diabetic? Yes Did you bring your meter with you today? No CBG Result 328   CC:  high blood pressure concerns.  History of Present Illness: 40 year old patient who is seen today for evaluation of her hypertension.  She was seen by GYN earlier and noted to have an elevated reading.  Review of her blood pressure is over the past year that revealed readings that are often slightly elevated to borderline high.  She is scheduled for elective hysterectomy next week. She is on diabetic medications, including Byetta.  she states that she omits This medication when her blood sugar is less than 150.  He apparently has had some hypoglycemia in the past and is on low dose Amaryl.  Her weight is down nicely over the past 9 months.  No recent hemoglobin A1c  Allergies (verified): No Known Drug Allergies  Past History:  Past Medical History: Diabetes mellitus, type II obesity Hypertension  Review of Systems       The patient complains of weight loss.  The patient denies anorexia, fever, weight gain, vision loss, decreased hearing, hoarseness, chest pain, syncope, dyspnea on exertion, peripheral edema, prolonged cough, headaches, hemoptysis, abdominal pain, melena, hematochezia, severe indigestion/heartburn, hematuria, incontinence, genital sores, muscle weakness, suspicious skin lesions, transient blindness, difficulty walking, depression, unusual weight change, abnormal bleeding, enlarged lymph nodes, angioedema, and breast masses.    Physical Exam  General:  overweight-appearing.  160 to 170 systolic over 100 diastolicoverweight-appearing.   Head:  Normocephalic and atraumatic without obvious abnormalities. No  apparent alopecia or balding. Eyes:  No corneal or conjunctival inflammation noted. EOMI. Perrla. Funduscopic exam benign, without hemorrhages, exudates or papilledema. Vision grossly normal. Mouth:  Oral mucosa and oropharynx without lesions or exudates.  Teeth in good repair. Neck:  No deformities, masses, or tenderness noted. Lungs:  Normal respiratory effort, chest expands symmetrically. Lungs are clear to auscultation, no crackles or wheezes. Heart:  Normal rate and regular rhythm. S1 and S2 normal without gallop, murmur, click, rub or other extra sounds. Msk:  No deformity or scoliosis noted of thoracic or lumbar spine.   Extremities:  No clubbing, cyanosis, edema, or deformity noted with normal full range of motion of all joints.     Impression & Recommendations:  Problem # 1:  HYPERTENSION (ICD-401.9)  Her updated medication list for this problem includes:    Furosemide 40 Mg Tabs (Furosemide) ..... One daily prn    Lisinopril-hydrochlorothiazide 20-12.5 Mg Tabs (Lisinopril-hydrochlorothiazide) ..... One daily  Her updated medication list for this problem includes:    Furosemide 40 Mg Tabs (Furosemide) ..... One daily prn    Lisinopril-hydrochlorothiazide 20-12.5 Mg Tabs (Lisinopril-hydrochlorothiazide) ..... One daily  Problem # 2:  DIABETES MELLITUS, TYPE II (ICD-250.00)  Her updated medication list for this problem includes:    Byetta 5 Mcg Pen 5 Mcg/0.20ml Soln (Exenatide) .Marland KitchenMarland KitchenMarland KitchenMarland Kitchen 5 micrograms two times a day    Glimepiride 1 Mg Tabs (Glimepiride) .[name]... 1 each am    Lisinopril-hydrochlorothiazide 20-12.5 Mg Tabs (Lisinopril-hydrochlorothiazide) ..... One daily    Metformin Hcl 500 Mg Xr24h-tab (Metformin hcl) .[name]..[name] Two  once daily  Orders: Capillary Blood Glucose/CBG (  57846) Venipuncture (96295) TLB-A1C / Hgb A1C (Glycohemoglobin) (83036-A1C) Specimen Handling (99000)  Her updated medication list for this problem includes:    Byetta 5 Mcg Pen 5 Mcg/0.8ml Soln  (Exenatide) .Marland KitchenMarland KitchenMarland KitchenMarland Kitchen 5 micrograms two times a day    Glimepiride 1 Mg Tabs (Glimepiride) .[name]... 1 each am    Lisinopril-hydrochlorothiazide 20-12.5 Mg Tabs (Lisinopril-hydrochlorothiazide) ..... One daily    Metformin Hcl 500 Mg Xr24h-tab (Metformin hcl) .[name]..[name] Two  once daily  Complete Medication List: 1)  Bd Ultra-fine Pen Needles 29g X 12.68mm Misc (Insulin pen needle) .... As directed twice daily 2)  Pen Needles 31g X 8 Mm Misc (Insulin pen needle) .... Use daily 3)  Byetta 5 Mcg Pen 5 Mcg/0.63ml Soln (Exenatide) .... 5 micrograms two times a day 4)  Glimepiride 1 Mg Tabs (Glimepiride) .[name].. 1 each am 5)  Accu-chek Aviva Strp (Glucose blood) 6)  Accu-chek Multiclix Lancets Misc (Lancets) 7)  Furosemide 40 Mg Tabs (Furosemide) .... One daily prn 8)  Ed K+10 10 Meq Cr-tabs (Potassium chloride) .... One twice daily 9)  Phentermine Hcl 37.5 Mg Caps (Phentermine hcl) .... One daily 10)  Lisinopril-hydrochlorothiazide 20-12.5 Mg Tabs (Lisinopril-hydrochlorothiazide) .... One daily 11)  Metformin Hcl 500 Mg Xr24h-tab (Metformin hcl) .... Two  once daily  Patient Instructions: 1)  Please schedule a follow-up appointment in 6 days  2)  Limit your Sodium (Salt) to less than 2 grams a day(slightly less than 1/2 a teaspoon) to prevent fluid retention, swelling, or worsening of symptoms. 3)  Check your Blood Pressure regularly. If it is above:  150/90 you should make an appointment. Prescriptions: METFORMIN HCL 500 MG XR24H-TAB (METFORMIN HCL) two  once daily  #180 x 2   Entered and Authorized by:   Gordy Savers  MD   Signed by:   Gordy Savers  MD on 01/06/2010   Method used:   Electronically to        [facility] (retail)       [address]       [place], [place]  [address]       Ph: [phone]       Fax: [phone]   RxID:   [id number] LISINOPRIL-HYDROCHLOROTHIAZIDE 20-12.5 MG TABS (LISINOPRIL-HYDROCHLOROTHIAZIDE) one daily  #90 x 2   Entered  and Authorized by:   Gordy Savers  MD   Signed by:   Gordy Savers  MD on 01/06/2010   Method used:   Electronically to        [facility] (retail)       [address]       [place], [place]  [address]       Ph: [phone]       Fax: [phone]   RxID:   [id number]    Orders Added: 1)  Capillary Blood Glucose/CBG [82948] 2)  Est. Patient Level IV [15176] 3)  Venipuncture [36415] 4)  TLB-A1C / Hgb A1C (Glycohemoglobin) [83036-A1C] 5)  Specimen Handling [99000]  Appended Document: Orders Update    Clinical Lists Changes  Orders: Added new Test order of TLB-A1C / Hgb A1C (Glycohemoglobin) (83036-A1C) - Signed

## 2010-02-24 NOTE — Progress Notes (Signed)
Summary: GYN question  Phone Note Call from Patient   Caller: Patient Call For: Gordy Savers  MD Summary of Call: Pt was seen in the ER this weekend, and told she had a pelvic mass...Marland KitchenMarland KitchenMarland Kitchenis having menorrhagia.  After speaking with her, she states she is at [facility].  Advised to stay there, and see a MD there since Dr. Kirtland Bouchard does not do GYN.   Initial call taken by: [facility] CMA AAMA,  December 20, 2009 8:51 AM  Follow-up for Phone Call        Pt is at [facility] being evaluated and hopefully a referral will be made.  She will let us know.  She could not get the referral for 2 weeks. Follow-up by: Lynann Beaver CMA AAMA,  December 20, 2009 10:52 AM  Additional Follow-up for Phone Call Additional follow up Details #1::        noted Additional Follow-up by: Gordy Savers  MD,  December 20, 2009 10:53 AM    gyn referral per Dr. Kirtland Bouchard

## 2010-02-24 NOTE — Progress Notes (Signed)
Summary: wgt loss  Phone Note Call from Patient   Caller: Patient Call For: Gordy Savers  MD Summary of Call: [phone] Pt is asking for a RX to take for weight loss.  Pt is diabetic. Initial call taken by: Lynann Beaver CMA,  May 31, 2009 1:35 PM  Follow-up for Phone Call        none presently available ; does patient wish to see a dietician? Follow-up by: Gordy Savers  MD,  May 31, 2009 2:54 PM  Additional Follow-up for Phone Call Additional follow up Details #1::        Pt would like to see a dietician. Additional Follow-up by: Lynann Beaver CMA,  May 31, 2009 3:23 PM    Additional Follow-up for Phone Call Additional follow up Details #2::    OK-please schedule Follow-up by: Gordy Savers  MD,  May 31, 2009 4:29 PM  Additional Follow-up for Phone Call Additional follow up Details #3:: Details for Additional Follow-up Action Taken: order created and sent to [facility] Additional Follow-up by: Duard Brady LPN,  Jun 01, 2009 3:17 PM

## 2010-02-24 NOTE — Assessment & Plan Note (Signed)
Summary: bilateral leg edema/dm   Vital Signs:  Patient profile:   40 year old female Weight:      211 pounds Temp:     98.8 degrees F oral BP sitting:   140 / 90  (right arm) Cuff size:   regular  Vitals Entered By: Duard Brady LPN (June 29, 1608 4:20 PM) CC: c/o feet swelling x 1 wk Is Patient Diabetic? Yes Did you bring your meter with you today? No CBG Result 207   CC:  c/o feet swelling x 1 wk.  History of Present Illness: 40 year old patient who has a history of type 2 diabetes who is followed by endocrinology.  Currently she is no longer on metformin, but a fructosamine level 2 months ago, was in a normal range.  For the past week, he has had the lower extremity edema since returning from a  trip to the beach.  No pulmonate symptoms.  No shortness of breath.  No productive cough.  She apparently lost her glucometer while at the beach.  She has a history of exogenous obesity.  She is followed by ophthalmology for proliferative diabetic retinopathy  Preventive Screening-Counseling & Management  Alcohol-Tobacco     Smoking Status: quit  Allergies (verified): No Known Drug Allergies  Past History:  Past Medical History: Reviewed history from 03/10/2009 and no changes required. Diabetes mellitus, type II obesity  Social History: Smoking Status:  quit  Physical Exam  General:  overweight-appearing.  normal blood pressure 130/82 Head:  Normocephalic and atraumatic without obvious abnormalities. No apparent alopecia or balding. Mouth:  Oral mucosa and oropharynx without lesions or exudates.  Teeth in good repair. Neck:  No deformities, masses, or tenderness noted. Lungs:  Normal respiratory effort, chest expands symmetrically. Lungs are clear to auscultation, no crackles or wheezes. Heart:  Normal rate and regular rhythm. S1 and S2 normal without gallop, murmur, click, rub or other extra sounds. Extremities:  2+ left pedal edema and 2+ right pedal edema.      Impression & Recommendations:  Problem # 1:  PROLIFERATIVE DIABETIC RETINOPATHY (ICD-362.02)  Problem # 2:  OBESITY (ICD-278.00)  Problem # 3:  DIABETES MELLITUS, TYPE II (ICD-250.00)  Her updated medication list for this problem includes:    Byetta 5 Mcg Pen 5 Mcg/0.72ml Soln (Exenatide) .Marland KitchenMarland KitchenMarland KitchenMarland Kitchen 5 micrograms two times a day    Glimepiride 1 Mg Tabs (Glimepiride) .[name]... 1 each am  Orders: Capillary Blood Glucose/CBG (96045)  Her updated medication list for this problem includes:    Byetta 5 Mcg Pen 5 Mcg/0.68ml Soln (Exenatide) .Marland KitchenMarland KitchenMarland KitchenMarland Kitchen 5 micrograms two times a day    Glimepiride 1 Mg Tabs (Glimepiride) .[name]... 1 each am  Problem # 4:  PEDAL EDEMA (ICD-782.3)  Her updated medication list for this problem includes:    Furosemide 40 Mg Tabs (Furosemide) ..... One daily prn  Her updated medication list for this problem includes:    Furosemide 40 Mg Tabs (Furosemide) ..... One daily prn  Complete Medication List: 1)  Bd Ultra-fine Pen Needles 29g X 12.66mm Misc (Insulin pen needle) .... As directed twice daily 2)  Pen Needles 31g X 8 Mm Misc (Insulin pen needle) .... Use daily 3)  Byetta 5 Mcg Pen 5 Mcg/0.36ml Soln (Exenatide) .... 5 micrograms two times a day 4)  Glimepiride 1 Mg Tabs (Glimepiride) .[name].. 1 each am 5)  Accu-chek Aviva Strp (Glucose blood) 6)  Accu-chek Multiclix Lancets Misc (Lancets) 7)  Furosemide 40 Mg Tabs (Furosemide) .... One daily prn  Patient Instructions: 1)  Please schedule a follow-up appointment in 3 months. 2)  Limit your Sodium (Salt). 3)  It is important that you exercise regularly at least 20 minutes 5 times a week. If you develop chest pain, have severe difficulty breathing, or feel very tired , stop exercising immediately and seek medical attention. 4)  You need to lose weight. Consider a lower calorie diet and regular exercise.  5)  Check your blood sugars regularly. If your readings are usually above : or below 70 you should contact our  office. 6)  It is important that your Diabetic A1c level is checked every 3 months. 7)  See your eye doctor yearly to check for diabetic eye damage. Prescriptions: FUROSEMIDE 40 MG TABS (FUROSEMIDE) one daily prn  #90 x 6   Entered and Authorized by:   Gordy Savers  MD   Signed by:   Gordy Savers  MD on 06/29/2009   Method used:   Print then Give to Patient   RxID:   [id number] GLIMEPIRIDE 1 MG TABS (GLIMEPIRIDE) 1 each am  #90 x 6   Entered and Authorized by:   Gordy Savers  MD   Signed by:   Gordy Savers  MD on 06/29/2009   Method used:   Print then Give to Patient   RxID:   [id number] BYETTA 5 MCG PEN 5 MCG/0.02ML SOLN (EXENATIDE) 5 micrograms two times a day  #3 x 6   Entered and Authorized by:   Gordy Savers  MD   Signed by:   Gordy Savers  MD on 06/29/2009   Method used:   Print then Give to Patient   RxID:   [id number] PEN NEEDLES 31G X 8 MM MISC (INSULIN PEN NEEDLE) use daily  #180 x 6   Entered and Authorized by:   Gordy Savers  MD   Signed by:   Gordy Savers  MD on 06/29/2009   Method used:   Print then Give to Patient   RxID:   [id number] BD ULTRA-FINE PEN NEEDLES 29G X 12.7MM MISC (INSULIN PEN NEEDLE) as directed twice daily  #180 x 6   Entered and Authorized by:   Gordy Savers  MD   Signed by:   Gordy Savers  MD on 06/29/2009   Method used:   Print then Give to Patient   RxID:   [id number] FUROSEMIDE 40 MG TABS (FUROSEMIDE) one daily prn  #90 x 6   Entered and Authorized by:   Gordy Savers  MD   Signed by:   Gordy Savers  MD on 06/29/2009   Method used:   Electronically to        [facility] (retail)       [address]       [place], [place]  [address]       Ph: [phone]       Fax: [phone]   RxID:   [id number] ACCU-CHEK MULTICLIX LANCETS  MISC (LANCETS)   #90 x 6   Entered and Authorized by:   Gordy Savers  MD   Signed by:   Gordy Savers  MD on 06/29/2009   Method used:   Print then Give to Patient   RxID:   [id number] ACCU-CHEK AVIVA  STRP (GLUCOSE BLOOD)   #90 x 6   Entered and Authorized by:   Janett Labella  Amador Cunas  MD   Signed by:   Gordy Savers  MD on 06/29/2009   Method used:   Print then Give to Patient   RxID:   [id number] GLIMEPIRIDE 1 MG TABS (GLIMEPIRIDE) 1 each am  #90 x 6   Entered and Authorized by:   Gordy Savers  MD   Signed by:   Gordy Savers  MD on 06/29/2009   Method used:   Electronically to        [facility] (retail)       [address]       [place], [place]  [address]       Ph: [phone]       Fax: [phone]   RxID:   [id number] BYETTA 5 MCG PEN 5 MCG/0.02ML SOLN (EXENATIDE) 5 micrograms two times a day  #3 x 6   Entered and Authorized by:   Gordy Savers  MD   Signed by:   Gordy Savers  MD on 06/29/2009   Method used:   Electronically to        [facility] (retail)       [address]       [place], [place]  [address]       Ph: [phone]       Fax: [phone]   RxID:   [id number] PEN NEEDLES 31G X 8 MM MISC (INSULIN PEN NEEDLE) use daily  #180 x 6   Entered and Authorized by:   Gordy Savers  MD   Signed by:   Gordy Savers  MD on 06/29/2009   Method used:   Electronically to        [facility] (retail)       [address]       [place], [place]  [address]       Ph: [phone]       Fax: [phone]   RxID:   [id number] BD ULTRA-FINE PEN NEEDLES 29G X 12.7MM MISC (INSULIN PEN NEEDLE) as directed twice daily  #180 x 6   Entered and Authorized by:   Gordy Savers  MD   Signed by:   Gordy Savers  MD on 06/29/2009   Method used:   Electronically to        [facility] (retail)       [address]       [place], [place]  [address]       Ph: [phone]       Fax: [phone]   RxID:   [id number]

## 2010-02-24 NOTE — Letter (Signed)
Summary: Out of Work  [facility] [employer]  [address]   [place], [place] [address]   Phone: [phone]  Fax: [phone]    May 14, 2008   Employee:  KIMBERLYE DILGER    To Whom It May Concern:   For Medical reasons, please excuse the above named employee from work for the following dates:  Start:    End:   05-18-2008;  patient may return to work without restrictions on this date  If you need additional information, please feel free to contact our office.         Sincerely,    Gordy Savers  MD

## 2010-02-24 NOTE — Assessment & Plan Note (Signed)
Summary: 1 mo rov/mm rsc with pt/mhf   Vital Signs:  Patient profile:   40 year old female Weight:      216 pounds BMI:     39.65 Temp:     98.5 degrees F oral BP sitting:   140 / 88  (left arm) Cuff size:   regular  Vitals Entered By: Raechel Ache, RN (April 17, 2008 8:34 AM) CBG Result 148   History of Present Illness: 40 year old female, who is seen today for follow-up of her diabetes.  She was resumed on Byetta  one month ago, which she has tolerated well.  She is also on  amaryl and. metformin.  Unfortunately, there has been no weight loss.  She has had a nice improvement in glycemic control with blood sugars in the 150 to 220 range.  Allergies: No Known Drug Allergies  Physical Exam  General:  overweight-appearing.  blood pressure 130/88overweight-appearing.     Impression & Recommendations:  Problem # 1:  OBESITY (ICD-278.00)  Problem # 2:  DIABETES MELLITUS, TYPE II (ICD-250.00)  Her updated medication list for this problem includes:    Metformin Hcl 1000 Mg Tabs (Metformin hcl) ..... One twice daily    Glimepiride 4 Mg Tabs (Glimepiride) ..... One daily    Byetta 10 Mcg Pen 10 Mcg/0.23ml Soln (Exenatide) ..... Use  twice daily  Orders: Capillary Blood Glucose (82948) Fingerstick (16109)  Her updated medication list for this problem includes:    Metformin Hcl 1000 Mg Tabs (Metformin hcl) ..... One twice daily    Glimepiride 4 Mg Tabs (Glimepiride) ..... One daily    Byetta 10 Mcg Pen 10 Mcg/0.12ml Soln (Exenatide) ..... Use  twice daily  Complete Medication List: 1)  Metformin Hcl 1000 Mg Tabs (Metformin hcl) .... One twice daily 2)  Glimepiride 4 Mg Tabs (Glimepiride) .... One daily 3)  Byetta 10 Mcg Pen 10 Mcg/0.93ml Soln (Exenatide) .... Use  twice daily 4)  Bd Ultra-fine Pen Needles 29g X 12.53mm Misc (Insulin pen needle) .... As directed twice daily  Patient Instructions: 1)  Please schedule a follow-up appointment in 3 months. 2)  It is important  that you exercise regularly at least 20 minutes 5 times a week. If you develop chest pain, have severe difficulty breathing, or feel very tired , stop exercising immediately and seek medical attention. 3)  You need to lose weight. Consider a lower calorie diet and regular exercise.  4)  Check your blood sugars regularly. If your readings are usually above : or below 70 you should contact our office. 5)  It is important that your Diabetic A1c level is checked every 3 months. 6)  See your eye doctor yearly to check for diabetic eye damage. Prescriptions: BD ULTRA-FINE PEN NEEDLES 29G X 12.7MM MISC (INSULIN PEN NEEDLE) as directed twice daily  #180 x 6   Entered and Authorized by:   Gordy Savers  MD   Signed by:   Gordy Savers  MD on 04/17/2008   Method used:   Print then Give to Patient   RxID:   [id number] BYETTA 10 MCG PEN 10 MCG/0.04ML SOLN (EXENATIDE) use  twice daily  #3 months x 6   Entered and Authorized by:   Gordy Savers  MD   Signed by:   Gordy Savers  MD on 04/17/2008   Method used:   Print then Give to Patient   RxID:   [id number]

## 2010-02-24 NOTE — Assessment & Plan Note (Signed)
Summary: new pt to establish/jls   Vital Signs:  Patient Profile:   40 Years Old Female Height:     62 inches Weight:      214 pounds BMI:     39.28 Temp:     98.4 degrees F oral Pulse rate:   88 / minute Pulse rhythm:   regular BP sitting:   140 / 80  (right arm) Cuff size:   large  Vitals Entered By: Raechel Ache, RN (March 16, 2008 2:07 PM)                 Chief Complaint:  To Establish- has diabetes and sugars around 300.Lori Greer  History of Present Illness: 37 year old, African-American female with a 10 year history of type 2 diabetes.  She states she has had very poor glycemic control with blood sugars in excess of 300.  She has been doctor shopping due to poor diabetic control.   She is a CNA, has been on Byetta in the past, and very conchal with injection. She is on metformin 1000 mg daily, and an unknown oral antihyperglycemic drug.  She has had no difficulties in the past with metformin intolerance.  She also tolerated the  Byetta without difficulties.  She states is he is motivated to lose weight and improve her diabetic condition.  Her last eye exam was one year ago by her estimate.    Current Allergies: No known allergies   Past Medical History:    Reviewed history and no changes required:       Diabetes mellitus, type II  Past Surgical History:    Reviewed history and no changes required:       gravida 5, para 5 abortus zero   Family History:    Reviewed history and no changes required:       father is 21.  History type 2 diabetes       mother is 78, history of coronary artery disease, prior MI              4 brothers 4 sisters-positive for diabetes                       Social History:    Reviewed history and no changes required:       Married       CNA       5 children, ages 62 to 63   Risk Factors:  Tobacco use:  never   Review of Systems  The patient denies anorexia, fever, weight loss, weight gain, vision loss, decreased hearing,  hoarseness, chest pain, syncope, dyspnea on exertion, peripheral edema, prolonged cough, headaches, hemoptysis, abdominal pain, melena, hematochezia, severe indigestion/heartburn, hematuria, incontinence, genital sores, muscle weakness, suspicious skin lesions, transient blindness, difficulty walking, depression, unusual weight change, abnormal bleeding, enlarged lymph nodes, angioedema, breast masses, and testicular masses.     Physical Exam  General:     overweight-appearing.  140/80 Head:     Normocephalic and atraumatic without obvious abnormalities. No apparent alopecia or balding. Eyes:     single large dot hemorrhage noted on the left Ears:     External ear exam shows no significant lesions or deformities.  Otoscopic examination reveals clear canals, tympanic membranes are intact bilaterally without bulging, retraction, inflammation or discharge. Hearing is grossly normal bilaterally. Nose:     External nasal examination shows no deformity or inflammation. Nasal mucosa are pink and moist without lesions or exudates. Mouth:  Oral mucosa and oropharynx without lesions or exudates.  Teeth in good repair. pierced tongue Neck:     No deformities, masses, or tenderness noted. Chest Wall:     No deformities, masses, or tenderness noted. Lungs:     Normal respiratory effort, chest expands symmetrically. Lungs are clear to auscultation, no crackles or wheezes. Heart:     Normal rate and regular rhythm. S1 and S2 normal without gallop, murmur, click, rub or other extra sounds. Abdomen:     Bowel sounds positive,abdomen soft and non-tender without masses, organomegaly or hernias noted. Msk:     No deformity or scoliosis noted of thoracic or lumbar spine.   Pulses:     R and L carotid,radial,femoral,dorsalis pedis and posterior tibial pulses are full and equal bilaterally Extremities:     No clubbing, cyanosis, edema, or deformity noted with normal full range of motion of all joints.     Neurologic:     No cranial nerve deficits noted. Station and gait are normal. Plantar reflexes are down-going bilaterally. DTRs are symmetrical throughout. Sensory, motor and coordinative functions appear intact. Skin:     Intact without suspicious lesions or rashes Cervical Nodes:     No lymphadenopathy noted Psych:     Cognition and judgment appear intact. Alert and cooperative with normal attention span and concentration. No apparent delusions, illusions, hallucinations    Impression & Recommendations:  Problem # 1:  DIABETES MELLITUS, TYPE II (ICD-250.00)  Her updated medication list for this problem includes:    Metformin Hcl 1000 Mg Tabs (Metformin hcl) ..... One twice daily    Glimepiride 4 Mg Tabs (Glimepiride) ..... One daily    Byetta 10 Mcg Pen 10 Mcg/0.81ml Soln (Exenatide) ..... Use  twice daily  Orders: Diabetic Clinic Referral (Diabetic)   Problem # 2:  OBESITY (ICD-278.00)  Orders: Diabetic Clinic Referral (Diabetic)   Complete Medication List: 1)  Metformin Hcl 1000 Mg Tabs (Metformin hcl) .... One twice daily 2)  Glimepiride 4 Mg Tabs (Glimepiride) .... One daily 3)  Byetta 10 Mcg Pen 10 Mcg/0.37ml Soln (Exenatide) .... Use  twice daily 4)  Bd Ultra-fine Pen Needles 29g X 12.72mm Misc (Insulin pen needle) .... As directed twice daily   Patient Instructions: 1)  Please schedule a follow-up appointment in 1 month. 2)  Limit your Sodium (Salt). 3)  It is important that you exercise regularly at least 20 minutes 5 times a week. If you develop chest pain, have severe difficulty breathing, or feel very tired , stop exercising immediately and seek medical attention. 4)  You need to lose weight. Consider a lower calorie diet and regular exercise.  5)  Check your blood sugars regularly. If your readings are usually above : or below 70 you should contact our office. 6)  It is important that your Diabetic A1c level is checked every 3 months. 7)  See your eye doctor  yearly to check for diabetic eye damage. 8)  Check your Blood Pressure regularly. If it is above: 130/80 you should make an appointment.   Prescriptions: BD ULTRA-FINE PEN NEEDLES 29G X 12.7MM MISC (INSULIN PEN NEEDLE) as directed twice daily  #180 x 6   Entered and Authorized by:   Gordy Savers  MD   Signed by:   Gordy Savers  MD on 03/16/2008   Method used:   Print then Give to Patient   RxID:   [id number] BYETTA 10 MCG PEN 10 MCG/0.04ML SOLN (EXENATIDE) use  twice daily  #  3 months x 0   Entered and Authorized by:   Gordy Savers  MD   Signed by:   Gordy Savers  MD on 03/16/2008   Method used:   Print then Give to Patient   RxID:   [id number] METFORMIN HCL 1000 MG TABS (METFORMIN HCL) one twice daily  #180 x 6   Entered and Authorized by:   Gordy Savers  MD   Signed by:   Gordy Savers  MD on 03/16/2008   Method used:   Print then Give to Patient   RxID:   [id number] GLIMEPIRIDE 4 MG TABS (GLIMEPIRIDE) one daily  #90 x 6   Entered and Authorized by:   Gordy Savers  MD   Signed by:   Gordy Savers  MD on 03/16/2008   Method used:   Print then Give to Patient   RxID:   [id number]

## 2010-02-24 NOTE — Letter (Signed)
Summary: [facility]  [facility]   Imported By: Maryln Gottron 04/20/2008 13:53:57  _____________________________________________________________________  External Attachment:    Type:   Image     Comment:   External Document

## 2010-02-24 NOTE — Assessment & Plan Note (Signed)
Summary: follow up from hospital   Vital Signs:  Patient profile:   40 year old female Weight:      211 pounds BP sitting:   140 / 80  (left arm) Cuff size:   regular  Vitals Entered By: Sid Falcon LPN (May 12, 2008 11:46 AM) CC: ER Visit in [place], [place], 2 night stay, Right facial problems, Stroke vs Bells Palsy Is Patient Diabetic? Yes   History of Present Illness: Patient is a pleasant 40 year old female seen as a work in. She was actually in [place] last week and Saturday morning woke up with some right facial weakness and slight change of speech but no true slurring. She went to a hospital there for evaluation.  No extremity weakness. No fevers or chills. No swallowing difficulties. She was initially told she may be having a stroke. She was admitted for 2 nights had MRI of the brain that was negative. She was subsequent toldl this was Bell's palsy. She was discharged yesterday and given prescriptions for Valtrex and prednisone which she has not yet filled.he feels her symptoms are already improving somewhat. She has not had any rashes or facial pain. No visual symptoms. No focal weakness. No ataxic symptoms.  Allergies: No Known Drug Allergies  Past History:  Past Medical History:    Diabetes mellitus, type II     (03/16/2008)  Past Surgical History:    gravida 5, para 5 abortus zero (03/16/2008)  Family History:    father is 19.  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    Physical Exam  General:  Well-developed,well-nourished,in no acute distress; alert,appropriate and cooperative throughout examination Head:  Normocephalic and atraumatic without obvious abnormalities. No apparent alopecia or balding. Eyes:  No corneal or conjunctival inflammation noted. EOMI. Perrla. Funduscopic exam benign, without hemorrhages, exudates or papilledema. Vision grossly normal. Ears:  No rashes in ear canal. Mouth:  Oral mucosa and oropharynx without lesions or exudates.  Teeth in good repair. Neck:  No deformities, masses, or tenderness noted. Heart:  Normal rate and regular rhythm. S1 and S2 normal without gallop, murmur, click, rub or other extra sounds. Neurologic:  Patient has evidence for weakness right side of face involving right facial nerve. This is evident with smiling. There is asymmetry of the right  forehead compared to the left with lack of wrinkling on the right side.other cranial nerves intact no focal strength deficits. Cerebellar function normal. Normal sensory function throughout Skin:  Intact without suspicious lesions or rashes   Impression & Recommendations:  Problem # 1:  BELL'S PALSY (ICD-351.0) She has classic Bell's palsy with facial weakness, inability to fully close the right eyelid and forehead asymmetry.  This is already improving somewhat. She is encouraged to finish her prednisone and Valtrex. Follow up will be scheduled with her primary physician. We've explained that usually this type of weakness fully resolved but that some people have mild residual deficits.  Complete Medication List: 1)  Metformin Hcl 1000 Mg Tabs (Metformin hcl) .... One twice daily 2)  Glimepiride 4 Mg Tabs (Glimepiride) .... One daily 3)  Byetta 10 Mcg Pen 10 Mcg/0.55ml Soln (Exenatide) .... Use  twice daily 4)  Bd Ultra-fine Pen Needles 29g X  12.59mm Misc (Insulin pen needle) .... As directed twice daily  Patient Instructions: 1)  Please schedule a follow-up appointment in 1 month.  2)  Schedule follow up with Dr. Amador Cunas in one month to reassess

## 2010-02-24 NOTE — Progress Notes (Signed)
  Phone Note Call from Patient Call back at Home Phone [phone]   Reason for Call: Talk to Nurse Summary of Call: Returning Judy's call. Initial call taken by: Warnell Forester,  May 14, 2008 2:14 PM  Follow-up for Phone Call        Phone Call Completed Follow-up by: Raechel Ache, RN,  May 14, 2008 2:20 PM

## 2010-03-26 ENCOUNTER — Inpatient Hospital Stay (HOSPITAL_COMMUNITY)
Admission: EM | Admit: 2010-03-26 | Discharge: 2010-03-28 | DRG: 343 | Disposition: A | Discharge status: Home / Self Care | Payer: 59 | Attending: General Surgery | Admitting: General Surgery

## 2010-03-26 ENCOUNTER — Other Ambulatory Visit (HOSPITAL_COMMUNITY): Payer: Self-pay

## 2010-03-26 ENCOUNTER — Other Ambulatory Visit: Payer: Self-pay | Admitting: Internal Medicine

## 2010-03-26 ENCOUNTER — Emergency Department (HOSPITAL_COMMUNITY): Payer: 59

## 2010-03-26 DIAGNOSIS — I10 Essential (primary) hypertension: Secondary | ICD-10-CM | POA: Diagnosis present

## 2010-03-26 DIAGNOSIS — E119 Type 2 diabetes mellitus without complications: Secondary | ICD-10-CM | POA: Diagnosis present

## 2010-03-26 DIAGNOSIS — K3580 Unspecified acute appendicitis: Principal | ICD-10-CM | POA: Diagnosis present

## 2010-03-26 LAB — GLUCOSE, CAPILLARY
Glucose-Capillary: 251 mg/dL — ABNORMAL HIGH (ref 70–99)
Glucose-Capillary: 147 mg/dL — ABNORMAL HIGH (ref 70–99)
Glucose-Capillary: 99 mg/dL (ref 70–99)
Glucose-Capillary: 153 mg/dL — ABNORMAL HIGH (ref 70–99)
Glucose-Capillary: 185 mg/dL — ABNORMAL HIGH (ref 70–99)
Glucose-Capillary: 158 mg/dL — ABNORMAL HIGH (ref 70–99)

## 2010-03-26 LAB — DIFFERENTIAL
Basophils Absolute: 0 10*3/uL (ref 0.0–0.1)
Basophils Relative: 0 % (ref 0–1)
Eosinophils Absolute: 0.1 10*3/uL (ref 0.0–0.7)
Eosinophils Relative: 1 % (ref 0–5)
Lymphocytes Relative: 17 % (ref 12–46)
Lymphs Abs: 1.9 10*3/uL (ref 0.7–4.0)
Monocytes Absolute: 0.6 10*3/uL (ref 0.1–1.0)
Monocytes Relative: 5 % (ref 3–12)
Neutro Abs: 8.9 10*3/uL — ABNORMAL HIGH (ref 1.7–7.7)
Neutrophils Relative %: 78 % — ABNORMAL HIGH (ref 43–77)

## 2010-03-26 LAB — BASIC METABOLIC PANEL
BUN: 13 mg/dL (ref 6–23)
CO2: 27 mEq/L (ref 19–32)
Calcium: 8.8 mg/dL (ref 8.4–10.5)
Chloride: 100 mEq/L (ref 96–112)
Creatinine, Ser: 0.92 mg/dL (ref 0.4–1.2)
GFR calc Af Amer: >60 mL/min (ref >60)
GFR calc non Af Amer: >60 mL/min (ref >60)
Glucose, Bld: 266 mg/dL — ABNORMAL HIGH (ref 70–99)
Potassium: 3.4 mEq/L — ABNORMAL LOW (ref 3.5–5.1)
Sodium: 137 mEq/L (ref 135–145)

## 2010-03-26 LAB — URINE MICROSCOPIC-ADD ON

## 2010-03-26 LAB — URINALYSIS, ROUTINE W REFLEX MICROSCOPIC
Bilirubin Urine: NEGATIVE
Glucose, UA: 250 mg/dL — AB
Ketones, ur: NEGATIVE mg/dL
Leukocytes, UA: NEGATIVE
Nitrite: NEGATIVE
Protein, ur: >300 mg/dL — AB
Specific Gravity, Urine: 1.024 (ref 1.005–1.030)
Urobilinogen, UA: 0.2 mg/dL (ref 0.0–1.0)
pH: 8 (ref 5.0–8.0)

## 2010-03-26 LAB — CBC
HCT: 36.1 % (ref 36.0–46.0)
Hemoglobin: 13.1 g/dL (ref 12.0–15.0)
MCH: 30.5 pg (ref 26.0–34.0)
MCHC: 36.3 g/dL — ABNORMAL HIGH (ref 30.0–36.0)
MCV: 84 fL (ref 78.0–100.0)
Platelets: 259 10*3/uL (ref 150–400)
RBC: 4.3 MIL/uL (ref 3.87–5.11)
RDW: 14.5 % (ref 11.5–15.5)
WBC: 11.5 10*3/uL — ABNORMAL HIGH (ref 4.0–10.5)

## 2010-03-26 LAB — SURGICAL PCR SCREEN
MRSA, PCR: NEGATIVE
Staphylococcus aureus: NEGATIVE

## 2010-03-26 LAB — POCT PREGNANCY, URINE: Preg Test, Ur: NEGATIVE

## 2010-03-26 MED ORDER — IOHEXOL 300 MG/ML  SOLN
100.0000 mL | Freq: Once | INTRAMUSCULAR | Status: AC | PRN
  Administered 2010-03-26: 100 mL via INTRAVENOUS

## 2010-03-27 LAB — GLUCOSE, CAPILLARY
Glucose-Capillary: 100 mg/dL — ABNORMAL HIGH (ref 70–99)
Glucose-Capillary: 107 mg/dL — ABNORMAL HIGH (ref 70–99)
Glucose-Capillary: 125 mg/dL — ABNORMAL HIGH (ref 70–99)
Glucose-Capillary: 138 mg/dL — ABNORMAL HIGH (ref 70–99)
Glucose-Capillary: 154 mg/dL — ABNORMAL HIGH (ref 70–99)
Glucose-Capillary: 155 mg/dL — ABNORMAL HIGH (ref 70–99)

## 2010-03-27 LAB — CBC
HCT: 31.4 % — ABNORMAL LOW (ref 36.0–46.0)
Hemoglobin: 11.1 g/dL — ABNORMAL LOW (ref 12.0–15.0)
MCH: 30.2 pg (ref 26.0–34.0)
MCHC: 35.4 g/dL (ref 30.0–36.0)
MCV: 85.3 fL (ref 78.0–100.0)
Platelets: 213 10*3/uL (ref 150–400)
RBC: 3.68 MIL/uL — ABNORMAL LOW (ref 3.87–5.11)
RDW: 14.9 % (ref 11.5–15.5)
WBC: 11.9 10*3/uL — ABNORMAL HIGH (ref 4.0–10.5)

## 2010-03-28 LAB — CBC
HCT: 29.6 % — ABNORMAL LOW (ref 36.0–46.0)
Hemoglobin: 10.3 g/dL — ABNORMAL LOW (ref 12.0–15.0)
MCH: 29.9 pg (ref 26.0–34.0)
MCHC: 34.8 g/dL (ref 30.0–36.0)
MCV: 86 fL (ref 78.0–100.0)
Platelets: 195 10*3/uL (ref 150–400)
RBC: 3.44 MIL/uL — ABNORMAL LOW (ref 3.87–5.11)
RDW: 14.9 % (ref 11.5–15.5)
WBC: 9.4 10*3/uL (ref 4.0–10.5)

## 2010-03-28 LAB — GLUCOSE, CAPILLARY: Glucose-Capillary: 85 mg/dL (ref 70–99)

## 2010-03-30 NOTE — Op Note (Addendum)
NAME:  Lori Greer, Lori Greer               ACCOUNT NO.:  [id number]  MEDICAL RECORD NO.:  [id number]           PATIENT TYPE:  I  LOCATION:  5158                         FACILITY:  [facility]  PHYSICIAN:  Gabrielle Dare. Janee Morn, M.D.DATE OF BIRTH:  [birth year]  DATE OF PROCEDURE:  03/26/2010 DATE OF DISCHARGE:                              OPERATIVE REPORT   PREOPERATIVE DIAGNOSIS:  Acute appendicitis.  POSTOPERATIVE DIAGNOSIS:  Acute appendicitis.  PROCEDURE:  Laparoscopic appendectomy.  SURGEON:  Gabrielle Dare. Janee Morn, MD  ANESTHESIA:  General endotracheal.  HISTORY OF PRESENT ILLNESS:  Lori Greer is a 40 year old African American female who was admitted through the emergency department with history, physical exam, and CT scan of the abdomen and pelvis consistent with acute appendicitis.  She is brought urgently to the operating room.  PROCEDURE IN DETAIL:  Informed consent was obtained.  The patient was identified in the preop holding area.  She received intravenous antibiotics.  She is brought to the operating room, general endotracheal anesthesia was administered by the anesthesia staff.  Her abdomen was prepped and draped in a sterile fashion.  Infraumbilical region was infiltrated with 0.25% Marcaine with epinephrine.  Infraumbilical incision was made.  Subcutaneous tissues were dissected down revealing the anterior fascia.  This was divided sharply along the midline, and the peritoneal cavity was entered under direct vision.  A 0 Vicryl pursestring suture was placed around the fascial opening.  The Hasson trocar was inserted into the abdomen.  The abdomen was insufflated with carbon dioxide in standard fashion.  Under direct vision, a 5-mm of right midabdominal port and a 12-mm left lower quadrant port were placed.  Local anesthetic was used at these port sites as well. Laparoscopic exploration revealed an acutely inflamed appendix with suppuration and cloudy fluid but no obvious frank  perforation.  It was encased in omentum, this was peeled away.  The appendix was retracted towards the abdominal wall.  The mesoappendix was then divided gradually with the Harmonic scalpel achieving excellent hemostasis.  This dissected things pretty down to the base which was much less inflamed than the rest of the appendix.  The base was then divided with a reticulating Endo-GIA stapler and the appendix was placed in an EndoCatch bag and removed from the abdomen via the left lower quadrant port site.  There was a little bit of spillage from the appendix from manipulation but again no large frank perforation.  The abdomen was then copiously irrigated.  Staple line on the cecum was double checked, and it appeared intact.  The cecum was compressed and there was no air or bowel leakage.  The area was copiously irrigated with 2 liters of warm saline, irrigation fluid returned clear.  There was no bleeding.  Staple line remained intact.  Remainder of the irrigation fluid was evacuated. Ports were removed under direct vision.  Pneumoperitoneum was released.The infraumbilical fascia was closed by tying the 0 Vicryl pursestring suture with care not to trap any intra-abdominal contents.  All three wounds were copiously irrigated, and the skin of left lower quadrant and infraumbilical wound were closed with running 4-0 Vicryl subcuticular stitch and  then all 3 wounds were closed with Dermabond. Sponge, needle, and instrument counts were all correct.  The patient tolerated the procedure well without apparent complication and was taken to recovery room in stable condition.     Gabrielle Dare Janee Morn, M.D.     BET/MEDQ  D:  03/26/2010  T:  03/27/2010  Job:  [id number]  cc:   Gordy Savers, MD  Electronically Signed by Violeta Gelinas M.D. on 03/29/2010 04:38:15 PM Electronically Signed by Violeta Gelinas M.D. on 03/29/2010 05:08:02 PM Electronically Signed by Violeta Gelinas M.D. on 03/29/2010  05:08:02 PM Electronically Signed by Violeta Gelinas M.D. on 03/29/2010 05:36:21 PM Electronically Signed by Violeta Gelinas M.D. on 03/29/2010 06:25:51 PM Electronically Signed by Violeta Gelinas M.D. on 03/29/2010 07:43:20 PM

## 2010-03-31 NOTE — H&P (Addendum)
  NAME:  Lori Greer, Lori Greer NO.:  [id number]  MEDICAL RECORD NO.:  [id number]           PATIENT TYPE:  E  LOCATION:  [facility]                         FACILITY:  [facility]  PHYSICIAN:  Gabrielle Dare. Janee Morn, M.D.DATE OF BIRTH:  Feb 02, 1970  DATE OF ADMISSION:  03/26/2010 DATE OF DISCHARGE:                             HISTORY & PHYSICAL   CHIEF COMPLAINT:  Right lower quadrant abdominal pain.  HISTORY OF PRESENT ILLNESS:  Lori Greer is a 40 year old African American female, who developed right lower quadrant abdominal pain last night around 9 p.m.  There was associated nausea and vomiting.  The pain persisted, and she came to the emergency department for further evaluation.  CT scan demonstrates acute appendicitis.  PAST MEDICAL HISTORY:  Non-insulin dependent diabetes mellitus.  PAST SURGICAL HISTORY:  Hysterectomy and left breast cyst removal.  SOCIAL HISTORY:  She smokes cigarettes.  She occasionally drinks alcohol.  She does not use drugs.  REVIEW OF SYSTEMS:  GI:  As above.  CONSTITUTIONAL:  History of diabetes and otherwise negative.  MEDICATIONS:  Byetta and metformin.  ALLERGIES:  No known drug allergies.  PHYSICAL EXAMINATION:  VITAL SIGNS:  Temperature 98.9, heart rate 99, blood pressure 175/102, respirations 16, saturations 98%. HEENT:  Pupils are equal.  Sclerae clear.  Oral mucosa is dry. NECK:  Trachea is midline. LUNGS:  Respiratory effort is normal with lungs clear to auscultation. CARDIAC:  Normal S1 and S2.  Distal pulses are 2+ and no peripheral edema. ABDOMEN:  Hypoactive bowel sounds.  It is nondistended.  She has tenderness in the right lower quadrant with no masses and no guarding. There is no generalized peritoneum and peritoneal signs. LYMPHATIC:  Some shoddy cervical lymph nodes. SKIN:  Warm and dry. NEUROLOGIC:  Without noted deficits.  LABORATORY STUDIES:  White blood cell count 11.5, hemoglobin 13.1. Basic metabolic panel is normal with  the exception of glucose of 266.  DIAGNOSTICS:  CT scan of the abdomen and pelvis shows acute appendicitis with multiple fecalith.  IMPRESSION: 1. Acute appendicitis. 2. Hypertension. 3. Noninsulin-dependent diabetes mellitus.  PLAN:  We will give her IV antibiotics.  We will take her to the operating room today for laparoscopic appendectomy.  Procedure risks and benefits were discussed with the patient, and she agrees.  We will see if she remains hypertensive here, she may need treatment for that, and we will treat her diabetes as well.     Gabrielle Dare Janee Morn, M.D.     BET/MEDQ  D:  03/26/2010  T:  03/26/2010  Job:  [id number]  cc:   Gordy Savers, MD  Electronically Signed by Violeta Gelinas M.D. on 03/29/2010 04:38:09 PM Electronically Signed by Violeta Gelinas M.D. on 03/29/2010 04:38:09 PM Electronically Signed by Violeta Gelinas M.D. on 03/29/2010 07:43:20 PM

## 2010-04-04 LAB — GLUCOSE, CAPILLARY
Glucose-Capillary: 118 mg/dL — ABNORMAL HIGH (ref 70–99)
Glucose-Capillary: 124 mg/dL — ABNORMAL HIGH (ref 70–99)
Glucose-Capillary: 126 mg/dL — ABNORMAL HIGH (ref 70–99)
Glucose-Capillary: 132 mg/dL — ABNORMAL HIGH (ref 70–99)
Glucose-Capillary: 137 mg/dL — ABNORMAL HIGH (ref 70–99)
Glucose-Capillary: 143 mg/dL — ABNORMAL HIGH (ref 70–99)
Glucose-Capillary: 160 mg/dL — ABNORMAL HIGH (ref 70–99)
Glucose-Capillary: 170 mg/dL — ABNORMAL HIGH (ref 70–99)
Glucose-Capillary: 183 mg/dL — ABNORMAL HIGH (ref 70–99)
Glucose-Capillary: 197 mg/dL — ABNORMAL HIGH (ref 70–99)
Glucose-Capillary: 212 mg/dL — ABNORMAL HIGH (ref 70–99)
Glucose-Capillary: 221 mg/dL — ABNORMAL HIGH (ref 70–99)
Glucose-Capillary: 239 mg/dL — ABNORMAL HIGH (ref 70–99)
Glucose-Capillary: 239 mg/dL — ABNORMAL HIGH (ref 70–99)
Glucose-Capillary: 248 mg/dL — ABNORMAL HIGH (ref 70–99)

## 2010-04-04 LAB — COMPREHENSIVE METABOLIC PANEL
ALT: 11 U/L (ref 0–35)
AST: 15 U/L (ref 0–37)
Albumin: 2.2 g/dL — ABNORMAL LOW (ref 3.5–5.2)
Alkaline Phosphatase: 75 U/L (ref 39–117)
BUN: 12 mg/dL (ref 6–23)
CO2: 25 mEq/L (ref 19–32)
Calcium: 9.4 mg/dL (ref 8.4–10.5)
Chloride: 102 mEq/L (ref 96–112)
Creatinine, Ser: 1.13 mg/dL (ref 0.4–1.2)
GFR calc Af Amer: >60 mL/min (ref >60)
GFR calc non Af Amer: 53 mL/min — ABNORMAL LOW (ref >60)
Glucose, Bld: 132 mg/dL — ABNORMAL HIGH (ref 70–99)
Potassium: 4 mEq/L (ref 3.5–5.1)
Sodium: 137 mEq/L (ref 135–145)
Total Bilirubin: 0.3 mg/dL (ref 0.3–1.2)
Total Protein: 6.8 g/dL (ref 6.0–8.3)

## 2010-04-04 LAB — HCG, SERUM, QUALITATIVE: Preg, Serum: NEGATIVE

## 2010-04-04 LAB — CBC
HCT: 28.3 % — ABNORMAL LOW (ref 36.0–46.0)
HCT: 32.2 % — ABNORMAL LOW (ref 36.0–46.0)
Hemoglobin: 10.1 g/dL — ABNORMAL LOW (ref 12.0–15.0)
Hemoglobin: 11.6 g/dL — ABNORMAL LOW (ref 12.0–15.0)
MCH: 29.4 pg (ref 26.0–34.0)
MCH: 29.8 pg (ref 26.0–34.0)
MCHC: 35.7 g/dL (ref 30.0–36.0)
MCHC: 36 g/dL (ref 30.0–36.0)
MCV: 82.3 fL (ref 78.0–100.0)
MCV: 82.8 fL (ref 78.0–100.0)
Platelets: 268 10*3/uL (ref 150–400)
Platelets: 362 10*3/uL (ref 150–400)
RBC: 3.44 MIL/uL — ABNORMAL LOW (ref 3.87–5.11)
RBC: 3.89 MIL/uL (ref 3.87–5.11)
RDW: 12 % (ref 11.5–15.5)
RDW: 12.1 % (ref 11.5–15.5)
WBC: 10.4 10*3/uL (ref 4.0–10.5)
WBC: 7 10*3/uL (ref 4.0–10.5)

## 2010-04-04 LAB — HEMOGLOBIN A1C
Hgb A1c MFr Bld: 7.9 % — ABNORMAL HIGH (ref <5.7)
Mean Plasma Glucose: 180 mg/dL — ABNORMAL HIGH (ref <117)

## 2010-04-04 LAB — TYPE AND SCREEN
ABO/RH(D): O POS
Antibody Screen: NEGATIVE

## 2010-04-04 LAB — ABO/RH: ABO/RH(D): O POS

## 2010-04-04 LAB — SURGICAL PCR SCREEN
MRSA, PCR: NEGATIVE
Staphylococcus aureus: NEGATIVE

## 2010-04-05 LAB — COMPREHENSIVE METABOLIC PANEL
ALT: 14 U/L (ref 0–35)
AST: 17 U/L (ref 0–37)
Albumin: 2 g/dL — ABNORMAL LOW (ref 3.5–5.2)
Alkaline Phosphatase: 71 U/L (ref 39–117)
BUN: 3 mg/dL — ABNORMAL LOW (ref 6–23)
CO2: 27 mEq/L (ref 19–32)
Calcium: 8.3 mg/dL — ABNORMAL LOW (ref 8.4–10.5)
Chloride: 104 mEq/L (ref 96–112)
Creatinine, Ser: 0.89 mg/dL (ref 0.4–1.2)
GFR calc Af Amer: >60 mL/min (ref >60)
GFR calc non Af Amer: >60 mL/min (ref >60)
Glucose, Bld: 215 mg/dL — ABNORMAL HIGH (ref 70–99)
Potassium: 3.4 mEq/L — ABNORMAL LOW (ref 3.5–5.1)
Sodium: 135 mEq/L (ref 135–145)
Total Bilirubin: 0.3 mg/dL (ref 0.3–1.2)
Total Protein: 5.6 g/dL — ABNORMAL LOW (ref 6.0–8.3)

## 2010-04-05 LAB — CBC
HCT: 34.7 % — ABNORMAL LOW (ref 36.0–46.0)
Hemoglobin: 12.6 g/dL (ref 12.0–15.0)
MCH: 31 pg (ref 26.0–34.0)
MCHC: 36.3 g/dL — ABNORMAL HIGH (ref 30.0–36.0)
MCV: 85.3 fL (ref 78.0–100.0)
Platelets: 226 10*3/uL (ref 150–400)
RBC: 4.07 MIL/uL (ref 3.87–5.11)
RDW: 12.5 % (ref 11.5–15.5)
WBC: 10.5 10*3/uL (ref 4.0–10.5)

## 2010-04-05 LAB — DIFFERENTIAL
Basophils Absolute: 0 10*3/uL (ref 0.0–0.1)
Basophils Relative: 0 % (ref 0–1)
Eosinophils Absolute: 0.3 10*3/uL (ref 0.0–0.7)
Eosinophils Relative: 3 % (ref 0–5)
Lymphocytes Relative: 23 % (ref 12–46)
Lymphs Abs: 2.4 10*3/uL (ref 0.7–4.0)
Monocytes Absolute: 1 10*3/uL (ref 0.1–1.0)
Monocytes Relative: 9 % (ref 3–12)
Neutro Abs: 6.8 10*3/uL (ref 1.7–7.7)
Neutrophils Relative %: 64 % (ref 43–77)

## 2010-04-05 LAB — URINE MICROSCOPIC-ADD ON

## 2010-04-05 LAB — WET PREP, GENITAL
Trich, Wet Prep: NONE SEEN
Yeast Wet Prep HPF POC: NONE SEEN

## 2010-04-05 LAB — POCT PREGNANCY, URINE: Preg Test, Ur: NEGATIVE

## 2010-04-05 LAB — URINALYSIS, ROUTINE W REFLEX MICROSCOPIC
Bilirubin Urine: NEGATIVE
Glucose, UA: 500 mg/dL — AB
Ketones, ur: NEGATIVE mg/dL
Leukocytes, UA: NEGATIVE
Nitrite: NEGATIVE
Protein, ur: >300 mg/dL — AB
Specific Gravity, Urine: 1.027 (ref 1.005–1.030)
Urobilinogen, UA: 0.2 mg/dL (ref 0.0–1.0)
pH: 6.5 (ref 5.0–8.0)

## 2010-04-05 LAB — GC/CHLAMYDIA PROBE AMP, GENITAL
Chlamydia, DNA Probe: NEGATIVE
GC Probe Amp, Genital: NEGATIVE

## 2010-04-12 ENCOUNTER — Encounter: Payer: Self-pay | Admitting: Internal Medicine

## 2010-04-12 ENCOUNTER — Ambulatory Visit (INDEPENDENT_AMBULATORY_CARE_PROVIDER_SITE_OTHER): Payer: 59 | Admitting: Internal Medicine

## 2010-04-12 DIAGNOSIS — E119 Type 2 diabetes mellitus without complications: Secondary | ICD-10-CM

## 2010-04-12 DIAGNOSIS — I10 Essential (primary) hypertension: Secondary | ICD-10-CM

## 2010-04-12 MED ORDER — EXENATIDE 5 MCG/0.02ML ~~LOC~~ SOPN: 5.0000 ug | PEN_INJECTOR | Freq: Two times a day (BID) | SUBCUTANEOUS | Status: DC

## 2010-04-12 MED ORDER — FUROSEMIDE 40 MG PO TABS: 40.0000 mg | ORAL_TABLET | Freq: Every day | ORAL | Status: DC | PRN

## 2010-04-12 MED ORDER — METFORMIN HCL ER 500 MG PO TB24: 500.0000 mg | ORAL_TABLET | Freq: Two times a day (BID) | ORAL | Status: DC

## 2010-04-12 MED ORDER — POTASSIUM CHLORIDE 10 MEQ PO TBCR: 10.0000 meq | EXTENDED_RELEASE_TABLET | Freq: Two times a day (BID) | ORAL | Status: DC

## 2010-04-12 MED ORDER — LISINOPRIL-HYDROCHLOROTHIAZIDE 20-12.5 MG PO TABS: 2.0000 | ORAL_TABLET | Freq: Every day | ORAL | Status: DC

## 2010-04-12 MED ORDER — GLIMEPIRIDE 1 MG PO TABS: 1.0000 mg | ORAL_TABLET | Freq: Every day | ORAL | Status: DC

## 2010-04-12 NOTE — Patient Instructions (Signed)
Limit your sodium (Salt) intake  Please check your blood pressure on a regular basis.  If it is consistently greater than 150/90, please make an office appointment.    return office visit 3 weeks

## 2010-04-12 NOTE — Progress Notes (Signed)
  Subjective:    Patient ID: Lori Greer, female    DOB: Jan 18, 1971, 40 y.o.   MRN: [id number]  HPI   40 year old patient who is seen today for followup of her type 2 diabetes. She believes that she has maintained very nice glycemic control on her present regimen. She was hospitalized 2 weeks ago for an appendectomy. Her main concern is elevated blood pressure blood pressure has been consistently elevated which was confirmed during her recent hospital stay. She has been compliant with her medications she does notice an occasional increase in pedal edema.    Review of Systems  Constitutional: Negative.   HENT: Negative for hearing loss, congestion, sore throat, rhinorrhea, dental problem, sinus pressure and tinnitus.   Eyes: Negative for pain, discharge and visual disturbance.  Respiratory: Negative for cough and shortness of breath.   Cardiovascular: Positive for leg swelling. Negative for chest pain and palpitations.  Gastrointestinal: Negative for nausea, vomiting, abdominal pain, diarrhea, constipation, blood in stool and abdominal distention.  Genitourinary: Negative for dysuria, urgency, frequency, hematuria, flank pain, vaginal bleeding, vaginal discharge, difficulty urinating, vaginal pain and pelvic pain.  Musculoskeletal: Negative for joint swelling, arthralgias and gait problem.  Skin: Negative for rash.  Neurological: Negative for dizziness, syncope, speech difficulty, weakness, numbness and headaches.  Hematological: Negative for adenopathy.  Psychiatric/Behavioral: Negative for behavioral problems, dysphoric mood and agitation. The patient is not nervous/anxious.        Objective:   Physical Exam  Constitutional: She is oriented to person, place, and time. She appears well-developed and well-nourished.        Blood pressure 160 over 94  HENT:  Head: Normocephalic.  Right Ear: External ear normal.  Left Ear: External ear normal.  Mouth/Throat: Oropharynx is clear and  moist.  Eyes: Conjunctivae and EOM are normal. Pupils are equal, round, and reactive to light.  Neck: Normal range of motion. Neck supple. No thyromegaly present.  Cardiovascular: Normal rate, regular rhythm, normal heart sounds and intact distal pulses.   Pulmonary/Chest: Effort normal and breath sounds normal.  Abdominal: Soft. Bowel sounds are normal. She exhibits no mass. There is no tenderness.  Musculoskeletal: Normal range of motion. She exhibits edema.        +1 pedal edema  Lymphadenopathy:    She has no cervical adenopathy.  Neurological: She is alert and oriented to person, place, and time.  Skin: Skin is warm and dry. No rash noted.  Psychiatric: She has a normal mood and affect. Her behavior is normal.          Assessment & Plan:   hypertension poor control disposition we'll increase her lisinopril to 40 mg daily and hydrochlorothiazide to 25 mg daily we'll continue furosemide once daily.  Diabetes well controlled. We'll check a hemoglobin A1c

## 2010-04-26 NOTE — Discharge Summary (Signed)
  NAME:  Lori Greer, LAMOS NO.:  [id number]  MEDICAL RECORD NO.:  [id number]           PATIENT TYPE:  I  LOCATION:  5158                         FACILITY:  [facility]  PHYSICIAN:  Currie Paris, M.D.DATE OF BIRTH:  [birth year]  DATE OF ADMISSION:  03/26/2010 DATE OF DISCHARGE:  03/28/2010                              DISCHARGE SUMMARY   HISTORY OF PRESENT ILLNESS:  Lori Greer is a 40 year old female who developed right lower quadrant abdominal pain and nausea and vomiting. She presented to the emergency department where her workup included CT scan showing evidence of acute appendicitis.  The patient was seen and admitted by Dr. Violeta Gelinas after his evaluation.  SUMMARY OF HOSPITAL COURSE:  Upon admission, the patient was taken to the operating room and underwent laparoscopic appendectomy without complication.  She was admitted to the floor in a stable condition.  No significant postoperative complications.  The patient continued to get antibiotics for 24 hours postoperatively, however, by postop day #2, she was afebrile and her white blood cell count had normalized.  She was tolerating regular diet and was appropriate for discharge home.  DISCHARGE DIAGNOSIS:  Acute appendicitis status post laparoscopic appendectomy.  DISCHARGE MEDICATIONS:  The patient will resume her home medications including, 1. Lisinopril/hydrochlorothiazide 20/12.5 one tablet daily. 2. Lasix 40 mg daily. 3. Amaryl 1 mg 1 tablet daily. 4. Byetta 5 units twice daily.  She is given instructions to follow up in our clinic in approximately 2- 3 weeks with preprinted discharge instructions to follow.  She can contact our office with any questions or concerns prior to that.     Brayton El, PA-C   ______________________________ Currie Paris, M.D.    KB/MEDQ  D:  04/20/2010  T:  04/21/2010  Job:  [id number]  Electronically Signed by Brayton El  on 04/26/2010 10:29:32  AM Electronically Signed by Cyndia Bent M.D. on 04/26/2010 11:42:07 AM

## 2010-04-27 ENCOUNTER — Other Ambulatory Visit: Payer: Self-pay | Admitting: Internal Medicine

## 2010-04-27 NOTE — Telephone Encounter (Signed)
60

## 2010-04-27 NOTE — Telephone Encounter (Signed)
Please advise 

## 2010-05-03 ENCOUNTER — Ambulatory Visit: Payer: 59 | Admitting: Internal Medicine

## 2010-06-10 NOTE — Op Note (Signed)
[facility]  Patient:    Lori Greer, Lori Greer                    MRN: [id number] Proc. Date: 07/23/00 Adm. Date:  [id number] Attending:  Meredith Leeds                           Operative Report  PREOPERATIVE DIAGNOSIS:  Right axillary abscess.  POSTOPERATIVE DIAGNOSIS:  Right axillary abscess.  PROCEDURE PERFORMED:  Incision and drainage of right axillary abscess.  SURGEON:  Zigmund Daniel, M.D.  ANESTHESIA:  General.  DESCRIPTION OF PROCEDURE:  After the patient was adequately monitored and anesthetized, she had routine preparation and draping of the right axilla.  I palpated the abscess and incised it at point of maximum fluctuance.  A large amount of pus came out.  A culture was collected.  I probed the abscess with a clamp, and opened all its ramifications, and made one small counter incision to ensure complete drainage.  After getting good hemostasis, I packed the abscess cavity and applied a bulky bandage.  She tolerated the operation well. D:  07/23/00 TD:  07/24/00 Job: [id number]

## 2010-09-08 ENCOUNTER — Encounter: Payer: Self-pay | Admitting: Internal Medicine

## 2010-09-08 ENCOUNTER — Ambulatory Visit (INDEPENDENT_AMBULATORY_CARE_PROVIDER_SITE_OTHER): Payer: 59 | Admitting: Internal Medicine

## 2010-09-08 DIAGNOSIS — E119 Type 2 diabetes mellitus without complications: Secondary | ICD-10-CM

## 2010-09-08 DIAGNOSIS — E1139 Type 2 diabetes mellitus with other diabetic ophthalmic complication: Secondary | ICD-10-CM

## 2010-09-08 DIAGNOSIS — E11359 Type 2 diabetes mellitus with proliferative diabetic retinopathy without macular edema: Secondary | ICD-10-CM

## 2010-09-08 DIAGNOSIS — I10 Essential (primary) hypertension: Secondary | ICD-10-CM

## 2010-09-08 MED ORDER — ACCU-CHEK MULTICLIX LANCETS MISC: Status: DC

## 2010-09-08 MED ORDER — "PEN NEEDLES 5/16"" 31G X 8 MM MISC": Status: DC

## 2010-09-08 MED ORDER — EXENATIDE 5 MCG/0.02ML ~~LOC~~ SOPN: 5.0000 ug | PEN_INJECTOR | Freq: Two times a day (BID) | SUBCUTANEOUS | Status: DC

## 2010-09-08 MED ORDER — METFORMIN HCL ER 500 MG PO TB24: 500.0000 mg | ORAL_TABLET | Freq: Two times a day (BID) | ORAL | Status: DC

## 2010-09-08 MED ORDER — FUROSEMIDE 40 MG PO TABS: 40.0000 mg | ORAL_TABLET | Freq: Every day | ORAL | Status: DC | PRN

## 2010-09-08 MED ORDER — PHENTERMINE HCL 37.5 MG PO CAPS: 37.5000 mg | ORAL_CAPSULE | ORAL | Status: DC

## 2010-09-08 MED ORDER — POTASSIUM CHLORIDE 10 MEQ PO TBCR: 10.0000 meq | EXTENDED_RELEASE_TABLET | Freq: Two times a day (BID) | ORAL | Status: DC

## 2010-09-08 MED ORDER — GLUCOSE BLOOD VI STRP: ORAL_STRIP | Status: DC

## 2010-09-08 MED ORDER — GLIMEPIRIDE 1 MG PO TABS: 1.0000 mg | ORAL_TABLET | Freq: Every day | ORAL | Status: DC

## 2010-09-08 MED ORDER — LISINOPRIL-HYDROCHLOROTHIAZIDE 20-12.5 MG PO TABS: 2.0000 | ORAL_TABLET | Freq: Every day | ORAL | Status: DC

## 2010-09-08 NOTE — Progress Notes (Signed)
  Subjective:    Patient ID: Lori Greer, female    DOB: 04/06/1970, 39 y.o.   MRN: [id number]  HPI  40 year old patient who has hypertension and type 2 diabetes. She has a history of retinopathy. She has not been seen in approximately 5 months but states she is maintained very nice glycemic control. No new concerns or complaints. She has a history of obesity and her present weight is 176. She feels that she is still receiving benefit from phentermine that she takes every morning. Denies any cardiopulmonary complaints    Review of Systems  Constitutional: Negative.   HENT: Negative for hearing loss, congestion, sore throat, rhinorrhea, dental problem, sinus pressure and tinnitus.   Eyes: Negative for pain, discharge and visual disturbance.  Respiratory: Negative for cough and shortness of breath.   Cardiovascular: Negative for chest pain, palpitations and leg swelling.  Gastrointestinal: Negative for nausea, vomiting, abdominal pain, diarrhea, constipation, blood in stool and abdominal distention.  Genitourinary: Negative for dysuria, urgency, frequency, hematuria, flank pain, vaginal bleeding, vaginal discharge, difficulty urinating, vaginal pain and pelvic pain.  Musculoskeletal: Negative for joint swelling, arthralgias and gait problem.  Skin: Negative for rash.  Neurological: Negative for dizziness, syncope, speech difficulty, weakness, numbness and headaches.  Hematological: Negative for adenopathy.  Psychiatric/Behavioral: Negative for behavioral problems, dysphoric mood and agitation. The patient is not nervous/anxious.        Objective:   Physical Exam  Constitutional: She is oriented to person, place, and time. She appears well-developed and well-nourished.       Blood pressure 130/84  HENT:  Head: Normocephalic.  Right Ear: External ear normal.  Left Ear: External ear normal.  Mouth/Throat: Oropharynx is clear and moist.  Eyes: Conjunctivae and EOM are normal. Pupils are  equal, round, and reactive to light.  Neck: Normal range of motion. Neck supple. No thyromegaly present.  Cardiovascular: Normal rate, regular rhythm, normal heart sounds and intact distal pulses.   Pulmonary/Chest: Effort normal and breath sounds normal.  Abdominal: Soft. Bowel sounds are normal. She exhibits no mass. There is no tenderness.  Musculoskeletal: Normal range of motion.  Lymphadenopathy:    She has no cervical adenopathy.  Neurological: She is alert and oriented to person, place, and time.  Skin: Skin is warm and dry. No rash noted.  Psychiatric: She has a normal mood and affect. Her behavior is normal.          Assessment & Plan:   Diabetes mellitus. We'll continue her present diabetic regimen and check a hemoglobin A1c History diabetic retinopathy. Ophthalmology followup encouraged Hypertension stable  All medications refilled Diet weight loss exercise on encouraged Recheck 3 months

## 2010-09-08 NOTE — Patient Instructions (Signed)
Limit your sodium (Salt) intake   Please check your hemoglobin A1c every 3 months  Please see your eye doctor yearly to check for diabetic eye damage  Please check your blood pressure on a regular basis.  If it is consistently greater than 150/90, please make an office appointment.  Return in 3 months for follow-up

## 2010-10-27 LAB — URINE MICROSCOPIC-ADD ON

## 2010-10-27 LAB — URINALYSIS, ROUTINE W REFLEX MICROSCOPIC
Bilirubin Urine: NEGATIVE
Glucose, UA: >1000 mg/dL — AB
Ketones, ur: NEGATIVE mg/dL
Leukocytes, UA: NEGATIVE
Nitrite: NEGATIVE
Protein, ur: NEGATIVE mg/dL
Specific Gravity, Urine: 1.038 — ABNORMAL HIGH (ref 1.005–1.030)
Urobilinogen, UA: 0.2 mg/dL (ref 0.0–1.0)
pH: 6 (ref 5.0–8.0)

## 2010-10-27 LAB — POCT I-STAT, CHEM 8
BUN: 8 mg/dL (ref 6–23)
Calcium, Ion: 1.17 mmol/L (ref 1.12–1.32)
Chloride: 100 mEq/L (ref 96–112)
Creatinine, Ser: 0.7 mg/dL (ref 0.4–1.2)
Glucose, Bld: 449 mg/dL — ABNORMAL HIGH (ref 70–99)
HCT: 36 % (ref 36.0–46.0)
Hemoglobin: 12.2 g/dL (ref 12.0–15.0)
Potassium: 4.3 mEq/L (ref 3.5–5.1)
Sodium: 134 mEq/L — ABNORMAL LOW (ref 135–145)
TCO2: 25 mmol/L (ref 0–100)

## 2010-10-27 LAB — GLUCOSE, CAPILLARY
Glucose-Capillary: 350 mg/dL — ABNORMAL HIGH (ref 70–99)
Glucose-Capillary: 514 mg/dL (ref 70–99)

## 2010-10-27 LAB — POCT PREGNANCY, URINE: Preg Test, Ur: NEGATIVE

## 2010-12-09 ENCOUNTER — Ambulatory Visit: Payer: 59 | Admitting: Internal Medicine

## 2011-02-15 ENCOUNTER — Telehealth: Payer: Self-pay | Admitting: *Deleted

## 2011-02-15 MED ORDER — ZOLPIDEM TARTRATE 10 MG PO TABS: 10.0000 mg | ORAL_TABLET | Freq: Every evening | ORAL | Status: DC | PRN

## 2011-02-15 NOTE — Telephone Encounter (Signed)
Pharmacy called

## 2011-02-15 NOTE — Telephone Encounter (Signed)
Generic ambien 10  #30 one nightly prn

## 2011-02-15 NOTE — Telephone Encounter (Signed)
Pt has gone 3 night without sleeping.  Has appt with Dr. Kirtland Bouchard this week, but would like to have a RX today for sleep.

## 2011-02-16 ENCOUNTER — Ambulatory Visit: Payer: 59 | Admitting: Internal Medicine

## 2011-03-09 ENCOUNTER — Emergency Department (HOSPITAL_COMMUNITY)
Admission: EM | Admit: 2011-03-09 | Discharge: 2011-03-09 | Disposition: A | Discharge status: Home / Self Care | Payer: BC Managed Care – PPO | Attending: Emergency Medicine | Admitting: Emergency Medicine

## 2011-03-09 ENCOUNTER — Encounter (HOSPITAL_COMMUNITY): Payer: Self-pay | Admitting: *Deleted

## 2011-03-09 DIAGNOSIS — Z79899 Other long term (current) drug therapy: Secondary | ICD-10-CM | POA: Insufficient documentation

## 2011-03-09 DIAGNOSIS — N61 Mastitis without abscess: Secondary | ICD-10-CM | POA: Insufficient documentation

## 2011-03-09 DIAGNOSIS — E669 Obesity, unspecified: Secondary | ICD-10-CM | POA: Insufficient documentation

## 2011-03-09 DIAGNOSIS — I10 Essential (primary) hypertension: Secondary | ICD-10-CM | POA: Insufficient documentation

## 2011-03-09 DIAGNOSIS — E119 Type 2 diabetes mellitus without complications: Secondary | ICD-10-CM | POA: Insufficient documentation

## 2011-03-09 DIAGNOSIS — N611 Abscess of the breast and nipple: Secondary | ICD-10-CM

## 2011-03-09 HISTORY — DX: Leiomyoma of uterus, unspecified: D25.9

## 2011-03-09 MED ORDER — DOXYCYCLINE HYCLATE 100 MG PO CAPS: 100.0000 mg | ORAL_CAPSULE | Freq: Two times a day (BID) | ORAL | Status: AC

## 2011-03-09 MED ORDER — TRAMADOL HCL 50 MG PO TABS: 50.0000 mg | ORAL_TABLET | Freq: Four times a day (QID) | ORAL | Status: AC | PRN

## 2011-03-09 NOTE — ED Provider Notes (Signed)
Medical screening examination/treatment/procedure(s) were performed by non-physician practitioner and as supervising physician I was immediately available for consultation/collaboration. Vetra Shinall, MD, FACEP   Cheyna Retana L Payslee Bateson, MD 03/09/11 2007 

## 2011-03-09 NOTE — ED Provider Notes (Signed)
History     CSN: [id number]  Arrival date & time 03/09/11  1020   First MD Initiated Contact with Patient 03/09/11 1232      Chief Complaint  Patient presents with  . Cyst    (Consider location/radiation/quality/duration/timing/severity/associated sxs/prior treatment) HPI Comments: Lori Greer is a 41 year old female with past medical history of type II diabetes, hypertension, and breast abscesses. She presents with a 1 day history of superficial right breast pain and cyst. She tried using a warm compress yesterday, which reduced pain. She comes in today with the hope that she can treat it before it becomes too big. She denies fever, recent illness. Her last abscess was about 3 years ago. She has had them I&D'ed as well as surgically excised in the past.  Patient is a 41 y.o. female presenting with abscess. The history is provided by the patient.  Abscess  This is a recurrent problem. The current episode started yesterday. The onset was gradual. The problem has been gradually worsening. Affected Location: right medial breast. The problem is mild. The abscess is characterized by redness, painfulness and swelling. Pertinent negatives include no fever, no diarrhea and no vomiting.    Past Medical History  Diagnosis Date  . Bell's palsy 05/12/2008  . DIABETES MELLITUS, TYPE II 03/16/2008  . HYPERTENSION 01/06/2010  . OBESITY 03/16/2008  . PEDAL EDEMA 06/29/2009  . Proliferative diabetic retinopathy 04/23/2009  . STASIS DERMATITIS 07/06/2009  . Fibroid, uterine     Past Surgical History  Procedure Date  . Appendectomy   . Uterine fibroid surgery   . Abdominal hysterectomy     No family history on file.  History  Substance Use Topics  . Smoking status: Current Some Day Smoker  . Smokeless tobacco: Never Used  . Alcohol Use: Yes     rarely    OB History    Grav Para Term Preterm Abortions TAB SAB Ect Mult Living                  Review of Systems  Constitutional: Negative for  fever and chills.  Gastrointestinal: Negative for nausea, vomiting and diarrhea.  Skin: Positive for color change.       Positive for abscess.  Hematological: Negative for adenopathy.    Allergies  Review of patient's allergies indicates no known allergies.  Home Medications   Current Outpatient Rx  Name Route Sig Dispense Refill  . EXENATIDE 5 MCG/0.02ML Bell SOLN Subcutaneous Inject 5 mcg into the skin 2 (two) times daily as needed. For diabetes.    . FUROSEMIDE 40 MG PO TABS Oral Take 40 mg by mouth daily as needed. For swelling or fluid retention.    [name] GLIMEPIRIDE 1 MG PO TABS Oral Take 1 mg by mouth daily as needed. For diabetes.    [name] LISINOPRIL-HYDROCHLOROTHIAZIDE 20-12.5 MG PO TABS Oral Take 2 tablets by mouth daily. 180 tablet 6  . METFORMIN HCL ER 500 MG PO TB24 Oral Take 500 mg by mouth 2 (two) times daily as needed. For diabetes.    [name] PHENTERMINE HCL 37.5 MG PO CAPS Oral Take 37.5 mg by mouth daily as needed. For weight loss.    [name] POTASSIUM CHLORIDE 10 MEQ PO TBCR Oral Take 10 mEq by mouth 2 (two) times daily as needed. For low potassium.    . SENNOSIDES 8.6 MG PO TABS Oral Take 1 tablet by mouth daily as needed. For constipation.    [name] ZOLPIDEM TARTRATE 10 MG PO TABS  Oral Take 10 mg by mouth at bedtime as needed. For sleep.      BP 178/89  Pulse 99  Temp(Src) 98.5 F (36.9 C) (Oral)  Resp 14  SpO2 100%  Physical Exam  Nursing note and vitals reviewed. Constitutional: She is oriented to person, place, and time. She appears well-developed and well-nourished. No distress.  HENT:  Head: Normocephalic and atraumatic.  Eyes: Conjunctivae are normal.  Neck: Normal range of motion. Neck supple.  Pulmonary/Chest: No respiratory distress.    Neurological: She is alert and oriented to person, place, and time.  Skin: Skin is warm and dry. She is not diaphoretic.  Psychiatric: She has a normal mood and affect.    ED Course  Procedures (including critical care time)  Labs  Reviewed - No data to display No results found.   1. Abscess of left breast     1:13 PM Patient seen and examined. Work-up initiated. Medications ordered.   Vital signs reviewed and are as follows: Filed Vitals:   03/09/11 1100  BP: 178/89  Pulse: 99  Temp: 98.5 F (36.9 C)  Resp: 14   HTN noted.   Patient counseled on use of narcotic pain medications. Counseled not to combine these medications with others containing tylenol. Urged not to drink alcohol, drive, or perform any other activities that requires focus while taking these medications. The patient verbalizes understanding and agrees with the plan.  The patient was urged to return to the Emergency Department urgently with worsening pain, swelling, expanding erythema especially if it streaks away from the affected area, fever, or if they have any other concerns.   The patient was instructed to in return to the Emergency Department or go to their PCP in 48 hours at that time for a recheck if their symptoms are not entirely resolved.  The patient verbalized understanding and stated agreement with this plan.   MDM  Patient with skin abscess NOT amenable to incision and drainage.  Will treat with abx, re-eval in 48 hours if no improvement. No complaints regarding BP. Patient is stable and appears well at time of discharge.         Eustace Moore [place], [place] 03/09/11 1316

## 2011-03-09 NOTE — Discharge Instructions (Signed)
Please read and follow instructions below.    You may use 800mg  ibuprofen as needed for pain control, every 8 hours, for the next 3 days. Take ibuprofen with food.  Use ultram as directed for severe pain.   Please keep the abscess covered for the rest of the day or if it is actively draining. You may wash around the area with warm soap and water tomorrow. Use warm compresses several    Return in two days for recheck if not improving.   Please return to the Emergency Department in if abscess becomes larger, you develop a fever, develop worsening redness or streaking around the skin abscess, or you have any other concerns.

## 2011-03-09 NOTE — ED Notes (Signed)
Pt reports cyst on R breast that she noticed yesterday. Describes as dull pain. No drainage noted.

## 2011-09-12 ENCOUNTER — Other Ambulatory Visit: Payer: Self-pay | Admitting: Internal Medicine

## 2011-10-03 ENCOUNTER — Ambulatory Visit: Payer: 59 | Admitting: Internal Medicine

## 2011-10-03 DIAGNOSIS — Z0289 Encounter for other administrative examinations: Secondary | ICD-10-CM

## 2011-10-31 ENCOUNTER — Encounter (INDEPENDENT_AMBULATORY_CARE_PROVIDER_SITE_OTHER): Payer: 59 | Admitting: Ophthalmology

## 2011-11-02 ENCOUNTER — Encounter: Payer: 59 | Admitting: Internal Medicine

## 2011-11-09 ENCOUNTER — Encounter: Payer: Self-pay | Admitting: Internal Medicine

## 2011-11-09 ENCOUNTER — Ambulatory Visit (INDEPENDENT_AMBULATORY_CARE_PROVIDER_SITE_OTHER): Payer: 59 | Admitting: Internal Medicine

## 2011-11-09 VITALS — BP 140/90 | HR 72 | Temp 98.1°F | Resp 18 | Ht 62.5 in | Wt 173.0 lb

## 2011-11-09 DIAGNOSIS — N189 Chronic kidney disease, unspecified: Secondary | ICD-10-CM

## 2011-11-09 DIAGNOSIS — E11359 Type 2 diabetes mellitus with proliferative diabetic retinopathy without macular edema: Secondary | ICD-10-CM

## 2011-11-09 DIAGNOSIS — E119 Type 2 diabetes mellitus without complications: Secondary | ICD-10-CM

## 2011-11-09 DIAGNOSIS — I10 Essential (primary) hypertension: Secondary | ICD-10-CM

## 2011-11-09 MED ORDER — INSULIN ASPART 100 UNIT/ML ~~LOC~~ SOLN: SUBCUTANEOUS | Status: DC

## 2011-11-09 MED ORDER — ATENOLOL 100 MG PO TABS: 100.0000 mg | ORAL_TABLET | Freq: Every day | ORAL | Status: DC

## 2011-11-09 NOTE — Progress Notes (Signed)
Subjective:    Patient ID: Lori Greer, female    DOB: 06/23/1970, 41 y.o.   MRN: [id number]  HPI  41 year old patient who is seen today to reestablish with our practice. She initially established in February of 2010. She has a long history of chronic hypertension as well as diabetes. She has been on multiple injectable and oral medications but never insulin. Her weight at that time 214. She was referred for nutritional counseling which she received on 04/15/2008. She also was referred for endocrine evaluation and was seen on 04/23/2009. Earlier in the year she relocated to [place]. She was hospitalized last month for uncontrolled hypertension and renal failure. 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 She was told that she would likely need HD in the near future. Complaints today include generalized malaise and weakness she generally feels cold and tired. Post hospital medications were reviewed and included both atenolol and metoprolol. She is now on diet control only for diabetes her weight today 173 She has a history of diabetic proliferative retinopathy and did receive recent treatment while in [place] and is scheduled to see Dr. Ashley Royalty next month. She is scheduled for followup with nephrology (Dr. Lowell Guitar) next week  Current Allergies:  No known allergies   Past Medical History:  Reviewed history and no changes required:   Diabetes mellitus, type II  Diabetic retinopathy-proliferative Chronic hypertension Exogenous obesity  Past Surgical History:  Reviewed history and no changes required:  gravida 5, para 5 abortus zero  Status post laparoscopic appendectomy March 2012 Status post total abdominal hysterectomy December 2011  Family History:  Reviewed history and no changes required:  father is 78. 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 Bell's palsy 05/12/2008  . DIABETES MELLITUS, TYPE II 03/16/2008  . HYPERTENSION 01/06/2010  . OBESITY 03/16/2008  . PEDAL EDEMA 06/29/2009  . Proliferative diabetic retinopathy(362.02) 04/23/2009  . STASIS DERMATITIS 07/06/2009  . Fibroid, uterine     History   Social History  . Marital Status: Married    Spouse Name: N/A    Number of Children: N/A  . Years of Education: N/A   Occupational History  . Not on file.   Social History Main Topics  . Smoking status: Former Games developer  . Smokeless tobacco: Never Used  . Alcohol Use: Yes     rarely  . Drug Use: No  . Sexually Active: Not on file   Other Topics Concern  . Not on file   Social History Narrative  . No narrative on file    Past Surgical History  Procedure Date  . Appendectomy   . Uterine fibroid surgery   . Abdominal hysterectomy     No family history on file.  No Known Allergies  Current Outpatient Prescriptions on File Prior to Visit  Medication Sig Dispense Refill  . amLODipine (NORVASC) 10 MG tablet Take 10 mg by mouth daily.       . furosemide (LASIX) 40 MG tablet Take 40 mg by mouth 3 (three) times daily as needed. For swelling or fluid retention.      . hydrALAZINE (APRESOLINE) 50 MG tablet Take  50 mg by mouth 3 (three) times daily.       . nitroGLYCERIN (NITRODUR - DOSED IN MG/24 HR) 0.4 mg/hr Place 1 patch onto the skin daily.       . insulin aspart (NOVOLOG FLEXPEN) 100 UNIT/ML injection 4 units prior to each meal; if blood sugar is greater than 160 give a total dose of 6 units prior to each meal  1 vial  12  . DISCONTD: furosemide (LASIX) 40 MG tablet TAKE ONE TABLET(40 MG TOTAL) BY MOUTH EVERY DAY AS NEEDED  30 tablet  0    BP 140/90  Pulse 72  Temp 98.1 F (36.7 C) (Oral)  Resp 18  Ht 5' 2.5" (1.588  m)  Wt 173 lb (78.472 kg)  BMI 31.14 kg/m2  SpO2 98%    Review of Systems  Constitutional: Positive for fatigue. Negative for fever, appetite change and unexpected weight change.  HENT: Negative for hearing loss, ear pain, nosebleeds, congestion, sore throat, mouth sores, trouble swallowing, neck stiffness, dental problem, voice change, sinus pressure and tinnitus.   Eyes: Negative for photophobia, pain, redness and visual disturbance.  Respiratory: Negative for cough, chest tightness and shortness of breath.   Cardiovascular: Negative for chest pain, palpitations and leg swelling.  Gastrointestinal: Negative for nausea, vomiting, abdominal pain, diarrhea, constipation, blood in stool, abdominal distention and rectal pain.  Genitourinary: Negative for dysuria, urgency, frequency, hematuria, flank pain, vaginal bleeding, vaginal discharge, difficulty urinating, genital sores, vaginal pain, menstrual problem and pelvic pain.  Musculoskeletal: Negative for back pain and arthralgias.  Skin: Negative for rash.  Neurological: Positive for weakness. Negative for dizziness, syncope, speech difficulty, light-headedness, numbness and headaches.  Hematological: Negative for adenopathy. Does not bruise/bleed easily.  Psychiatric/Behavioral: Negative for suicidal ideas, behavioral problems, self-injury, dysphoric mood and agitation. The patient is not nervous/anxious.        Objective:   Physical Exam  Constitutional: She is oriented to person, place, and time. She appears well-developed and well-nourished.       Blood pressure 140/80  HENT:  Head: Normocephalic and atraumatic.  Right Ear: External ear normal.  Left Ear: External ear normal.  Mouth/Throat: Oropharynx is clear and moist.  Eyes: Conjunctivae normal and EOM are normal.  Neck: Normal range of motion. Neck supple. No JVD present. No thyromegaly present.  Cardiovascular: Normal rate, regular rhythm, normal heart sounds and intact  distal pulses.   No murmur heard. Pulmonary/Chest: Effort normal and breath sounds normal. She has no wheezes. She has no rales.       Nitroglycerin patch left upper anterior chest wall  Abdominal: Soft. Bowel sounds are normal. She exhibits no distension and no mass. There is no tenderness. There is no rebound and no guarding.  Musculoskeletal: Normal range of motion. She exhibits no edema and no tenderness.  Neurological: She is alert and oriented to person, place, and time. She has normal reflexes. No cranial nerve deficit. She exhibits normal muscle tone. Coordination normal.  Skin: Skin is warm and dry. No rash noted.  Psychiatric: She has a normal mood and affect. Her behavior is normal.          Assessment & Plan:   Preventive Health  Care CKD- f/u Nephrology Retinopathy- f/u Ophthalmology DM- start mealtime insulin; recheck 3 weeks

## 2011-11-09 NOTE — Patient Instructions (Addendum)
Renal followup with Dr. Lowell Guitar next week as scheduled Ophthalmology follow up with Dr. Ashley Royalty as scheduled  Return here in 4 weeks  Limit your sodium (Salt) intake  Please check your blood pressure on a regular basis.  If it is consistently greater than 150/90, please make an office appointment.

## 2011-11-10 ENCOUNTER — Telehealth: Payer: Self-pay | Admitting: Internal Medicine

## 2011-11-10 NOTE — Telephone Encounter (Signed)
Pt was seen on 11-09-2011. Pt is having trouble sleeping and no energy. Please advise

## 2011-11-13 MED ORDER — ZOLPIDEM TARTRATE 5 MG PO TABS: 5.0000 mg | ORAL_TABLET | Freq: Every evening | ORAL | Status: DC | PRN

## 2011-11-13 NOTE — Telephone Encounter (Signed)
Was seen for cpx 10-17 please advise

## 2011-11-13 NOTE — Telephone Encounter (Signed)
Patient has renal failure. Has patient been seen by nephrology recently?  (Dr Lowell Guitar)  Generic Ambien 5 #30 one at bedtime as needed for sleep

## 2011-11-13 NOTE — Telephone Encounter (Signed)
Spoke with pt -informed of rx to be called in - she has appt with Dr. Lowell Guitar tomorrow.

## 2011-11-14 ENCOUNTER — Telehealth: Payer: Self-pay | Admitting: Internal Medicine

## 2011-11-14 ENCOUNTER — Encounter (INDEPENDENT_AMBULATORY_CARE_PROVIDER_SITE_OTHER): Payer: 59 | Admitting: Ophthalmology

## 2011-11-14 MED ORDER — HYDROCODONE-ACETAMINOPHEN 5-500 MG PO TABS: 1.0000 | ORAL_TABLET | Freq: Three times a day (TID) | ORAL | Status: DC | PRN

## 2011-11-14 NOTE — Telephone Encounter (Signed)
Vicodin 5/500 #50 one every 8 hours as needed for pain

## 2011-11-14 NOTE — Telephone Encounter (Signed)
Please advise 

## 2011-11-14 NOTE — Telephone Encounter (Signed)
Pt called req pain med. [facility] on [address]

## 2011-11-14 NOTE — Telephone Encounter (Signed)
Called in.

## 2011-11-16 ENCOUNTER — Encounter (INDEPENDENT_AMBULATORY_CARE_PROVIDER_SITE_OTHER): Payer: Self-pay | Admitting: General Surgery

## 2011-11-17 ENCOUNTER — Encounter (INDEPENDENT_AMBULATORY_CARE_PROVIDER_SITE_OTHER): Payer: Self-pay | Admitting: General Surgery

## 2011-11-17 ENCOUNTER — Ambulatory Visit (INDEPENDENT_AMBULATORY_CARE_PROVIDER_SITE_OTHER): Payer: Medicaid Other | Admitting: General Surgery

## 2011-11-17 VITALS — BP 122/74 | HR 74 | Temp 98.4°F | Resp 18 | Ht 62.0 in | Wt 174.0 lb

## 2011-11-17 DIAGNOSIS — N185 Chronic kidney disease, stage 5: Secondary | ICD-10-CM

## 2011-11-17 NOTE — Progress Notes (Signed)
Subjective:     Patient ID: Lori Greer, female   DOB: 04-05-1970, 41 y.o.   MRN: [id number]  HPI The patient is a 41 year old female who was recently diagnosed with stage V chronic kidney disease. Patient was referred for peritoneal dialysis catheter placement at this time is patient this is likely start undergoing dialysis while being evaluated for a kidney transplant. Patient also has a history of diabetes as well as hypertension which patient is currently being treated for. Patient states she's had a previous cardiologist for a murmur. Patient states that she does wear a nitroglycerin patch on daily basis, and was not wearing his patch does complain of chest pain. The patient has been to upon the session about peritoneal dialysis catheter but at this time does not have education to undergo dialysis catheter on her own. Patient will likely need further education in regards peritoneal dialysis and the septum execution of the perineal dialysis.  The patient currently states she urinates 8 to 10 times a day and is currently not under any hemodialysis.    Review of Systems  Constitutional: Negative.   HENT: Negative.   Eyes: Negative.   Respiratory: Negative.   Cardiovascular: Negative.   Gastrointestinal: Negative.        Objective:   Physical Exam  Constitutional: She is oriented to person, place, and time. She appears well-developed and well-nourished.  HENT:  Head: Normocephalic and atraumatic.  Eyes: Conjunctivae normal and EOM are normal. Pupils are equal, round, and reactive to light.  Neck: Normal range of motion. Neck supple.  Cardiovascular: Normal rate and normal heart sounds.   Pulmonary/Chest: Effort normal and breath sounds normal.  Abdominal: Soft. Bowel sounds are normal. She exhibits no mass. There is no tenderness. There is no rebound and no guarding.         Well-healed Pfannenstiel incision as well as laparoscopic appendectomy incisions.  Musculoskeletal: Normal  range of motion.  Neurological: She is alert and oriented to person, place, and time.       Assessment:     A 41 year old female with stage V chronic kidney disease in need of peritoneal dialysis catheter. The patient is also has a history of cardiac murmur as well as nitroglycerin and use and chest pain.    Plan:     1. We discussed the risks and benefits of peritoneal dialysis catheter surgery as well as having a peritoneal dialysis catheter. Patient will require further treatment and for the peritoneal dialysis catheter. Patient also to be evaluated for a kidney transplant  2. Patient was recently relocated from [place] at which time she was followed by a cardiologist for her murmur and is on daily nitroglycerin, and does have chest pain when on nitroglycerin. Patient evaluated by cardiologist here  In stratified for surgery. Once we have cardiac evaluation we will discuss scheduling the laparoscopic peritoneal dialysis catheter placement.

## 2011-11-20 ENCOUNTER — Encounter: Payer: 59 | Admitting: Internal Medicine

## 2011-11-22 ENCOUNTER — Encounter (INDEPENDENT_AMBULATORY_CARE_PROVIDER_SITE_OTHER): Payer: BC Managed Care – PPO | Admitting: Ophthalmology

## 2011-11-22 DIAGNOSIS — E1139 Type 2 diabetes mellitus with other diabetic ophthalmic complication: Secondary | ICD-10-CM

## 2011-11-22 DIAGNOSIS — E11311 Type 2 diabetes mellitus with unspecified diabetic retinopathy with macular edema: Secondary | ICD-10-CM

## 2011-11-22 DIAGNOSIS — E11359 Type 2 diabetes mellitus with proliferative diabetic retinopathy without macular edema: Secondary | ICD-10-CM

## 2011-11-22 DIAGNOSIS — H43819 Vitreous degeneration, unspecified eye: Secondary | ICD-10-CM

## 2011-11-23 ENCOUNTER — Telehealth: Payer: Self-pay | Admitting: Internal Medicine

## 2011-11-23 ENCOUNTER — Other Ambulatory Visit: Payer: Self-pay | Admitting: Internal Medicine

## 2011-11-23 NOTE — Telephone Encounter (Signed)
Spoke with pt- informed to check with renal doctor - we dont get samples of that med. I have no test strips at this time - check back. Also call [phone] to see about patient assistance

## 2011-11-23 NOTE — Telephone Encounter (Signed)
Caller: Lisaann/Patient; Patient Name: Lori Greer; PCP: Eleonore Chiquito [facility]); Best Callback Phone Number: [phone]; Reason for call: Other Calling regarding samples of Zemplar and glucose strips for her meter, states she does not have any money for these at this time. Needs some help. Please call pt back regarding this matter, thanks.

## 2011-11-23 NOTE — Telephone Encounter (Signed)
Zemplar? I dont know this med- would we have it to sample?

## 2011-11-23 NOTE — Telephone Encounter (Signed)
No samples; the patient's renal doctors may have samples of this medication. Ok for strips if available

## 2011-11-30 ENCOUNTER — Ambulatory Visit (INDEPENDENT_AMBULATORY_CARE_PROVIDER_SITE_OTHER): Payer: PRIVATE HEALTH INSURANCE | Admitting: Cardiology

## 2011-11-30 ENCOUNTER — Encounter: Payer: Self-pay | Admitting: Cardiology

## 2011-11-30 VITALS — BP 170/83 | HR 67 | Wt 171.0 lb

## 2011-11-30 DIAGNOSIS — Z01810 Encounter for preprocedural cardiovascular examination: Secondary | ICD-10-CM

## 2011-11-30 DIAGNOSIS — R079 Chest pain, unspecified: Secondary | ICD-10-CM

## 2011-11-30 DIAGNOSIS — R011 Cardiac murmur, unspecified: Secondary | ICD-10-CM

## 2011-11-30 DIAGNOSIS — R0989 Other specified symptoms and signs involving the circulatory and respiratory systems: Secondary | ICD-10-CM

## 2011-11-30 NOTE — Assessment & Plan Note (Signed)
Check carotid Dopplers for right carotid bruit.

## 2011-11-30 NOTE — Assessment & Plan Note (Signed)
Await results of previous functional study. If negative she may proceed with surgery.

## 2011-11-30 NOTE — Patient Instructions (Addendum)
Your physician recommends that you schedule a follow-up appointment in: AS NEEDED PENDING TEST RESULTS  Your physician has requested that you have an echocardiogram. Echocardiography is a painless test that uses sound waves to create images of your heart. It provides your doctor with information about the size and shape of your heart and how well your heart's chambers and valves are working. This procedure takes approximately one hour. There are no restrictions for this procedure.   Your physician has requested that you have a carotid duplex. This test is an ultrasound of the carotid arteries in your neck. It looks at blood flow through these arteries that supply the brain with blood. Allow one hour for this exam. There are no restrictions or special instructions.    

## 2011-11-30 NOTE — Progress Notes (Signed)
HPI: 41 year old female for evaluation of chest pain. Patient has chronic renal failure and is nearing dialysis. She is scheduled to have peritoneal dialysis catheter placement and we were asked to evaluate prior to that as well. The patient does not have dyspnea on exertion, orthopnea, PND, palpitations or syncope. She has had occasional chest pain which was worse in the past. It is in the substernal area and lasts seconds. It increases with lying flat and improves with sitting up. She had a stress test in [place] approximately 2 months ago that she states was normal. I do not have those results available. Because of the above we were asked to evaluate.  Current Outpatient Prescriptions  Medication Sig Dispense Refill  . amLODipine (NORVASC) 10 MG tablet Take 10 mg by mouth daily.       [name] aspirin 81 MG tablet Take 81 mg by mouth daily.      [name] atenolol (TENORMIN) 100 MG tablet Take 1 tablet (100 mg total) by mouth daily.  90 tablet  3  . furosemide (LASIX) 40 MG tablet Take 40 mg by mouth 3 (three) times daily as needed. For swelling or fluid retention.      . hydrALAZINE (APRESOLINE) 50 MG tablet Take 50 mg by mouth 3 (three) times daily.       [name] HYDROcodone-acetaminophen (VICODIN) 5-500 MG per tablet Take 1 tablet by mouth every 8 (eight) hours as needed for pain.  50 tablet  0  . insulin aspart (NOVOLOG FLEXPEN) 100 UNIT/ML injection 4 units prior to each meal; if blood sugar is greater than 160 give a total dose of 6 units prior to each meal  1 vial  12  . nitroGLYCERIN (NITRODUR - DOSED IN MG/24 HR) 0.4 mg/hr Place 1 patch onto the skin daily.       . sevelamer (RENVELA) 800 MG tablet Take 800 mg by mouth 3 (three) times daily with meals.      [name] ZEMPLAR 1 MCG capsule Take 1 mcg by mouth daily.       [name] zolpidem (AMBIEN) 5 MG tablet Take 1 tablet (5 mg total) by mouth at bedtime as needed for sleep.  30 tablet  0  . metoprolol (LOPRESSOR) 50 MG tablet         No Known Allergies  Past Medical  History  Diagnosis Date  . Bell's palsy 05/12/2008  . DIABETES MELLITUS, TYPE II 03/16/2008  . HYPERTENSION 01/06/2010  . OBESITY 03/16/2008  . Proliferative diabetic retinopathy(362.02) 04/23/2009  . STASIS DERMATITIS 07/06/2009  . Fibroid, uterine   . Murmur   . Chronic renal failure     Past Surgical History  Procedure Date  . Appendectomy   . Uterine fibroid surgery   . Abdominal hysterectomy     History   Social History  . Marital Status: Married    Spouse Name: N/A    Number of Children: 5  . Years of Education: N/A   Occupational History  . Not on file.   Social History Main Topics  . Smoking status: Former Games developer  . Smokeless tobacco: Never Used  . Alcohol Use: Yes     Comment: rarely  . Drug Use: No  . Sexually Active: Not on file   Other Topics Concern  . Not on file   Social History Narrative  . No narrative on file    Family History  Problem Relation Age of Onset  . Heart disease Mother     MI at age 32  .  Diabetes Father   . Kidney failure Father   . Diabetes Sister     1 sister with diabtes  . Diabetes Brother     ROS: no fevers or chills, productive cough, hemoptysis, dysphasia, odynophagia, melena, hematochezia, dysuria, hematuria, rash, seizure activity, orthopnea, PND, pedal edema, claudication. Remaining systems are negative.  Physical Exam:   Blood pressure 170/83, pulse 67, weight 171 lb (77.565 kg).  General:  Well developed/well nourished in NAD Skin warm/dry Patient not depressed No peripheral clubbing Back-normal HEENT-normal/normal eyelids Neck supple/normal carotid upstroke bilaterally; right carotid bruit; no JVD; no thyromegaly chest - CTA/ normal expansion CV - RRR/normal S1 and S2; no rubs or gallops;  PMI nondisplaced, 2/6 systolic murmur left sternal border Abdomen -NT/ND, no HSM, no mass, + bowel sounds, no bruit 2+ femoral pulses, no bruits Ext-no edema, chords, 2+ DP Neuro-grossly nonfocal  ECG sinus rhythm at  a rate of 67. No significant ST changes. Low voltage.

## 2011-11-30 NOTE — Assessment & Plan Note (Signed)
Symptoms very atypical. She had a recent functional study at an outside hospital. We will obtain results. If negative no plans for further ischemia evaluation.

## 2011-11-30 NOTE — Assessment & Plan Note (Signed)
Schedule echocardiogram but maybe ejection murmur.

## 2011-12-05 ENCOUNTER — Ambulatory Visit (HOSPITAL_COMMUNITY): Payer: PRIVATE HEALTH INSURANCE | Attending: Cardiology

## 2011-12-05 DIAGNOSIS — N19 Unspecified kidney failure: Secondary | ICD-10-CM | POA: Insufficient documentation

## 2011-12-05 DIAGNOSIS — R079 Chest pain, unspecified: Secondary | ICD-10-CM | POA: Insufficient documentation

## 2011-12-05 DIAGNOSIS — R011 Cardiac murmur, unspecified: Secondary | ICD-10-CM | POA: Insufficient documentation

## 2011-12-05 DIAGNOSIS — Z87891 Personal history of nicotine dependence: Secondary | ICD-10-CM | POA: Insufficient documentation

## 2011-12-05 DIAGNOSIS — E119 Type 2 diabetes mellitus without complications: Secondary | ICD-10-CM | POA: Insufficient documentation

## 2011-12-05 DIAGNOSIS — Z01818 Encounter for other preprocedural examination: Secondary | ICD-10-CM | POA: Insufficient documentation

## 2011-12-05 DIAGNOSIS — R0989 Other specified symptoms and signs involving the circulatory and respiratory systems: Secondary | ICD-10-CM | POA: Insufficient documentation

## 2011-12-05 NOTE — Progress Notes (Signed)
Echocardiogram performed.  

## 2011-12-07 ENCOUNTER — Ambulatory Visit (INDEPENDENT_AMBULATORY_CARE_PROVIDER_SITE_OTHER): Payer: PRIVATE HEALTH INSURANCE | Admitting: Internal Medicine

## 2011-12-07 ENCOUNTER — Encounter: Payer: Self-pay | Admitting: Internal Medicine

## 2011-12-07 VITALS — BP 126/70 | HR 68 | Temp 98.1°F | Resp 16 | Wt 165.0 lb

## 2011-12-07 DIAGNOSIS — N189 Chronic kidney disease, unspecified: Secondary | ICD-10-CM

## 2011-12-07 DIAGNOSIS — E119 Type 2 diabetes mellitus without complications: Secondary | ICD-10-CM

## 2011-12-07 DIAGNOSIS — I10 Essential (primary) hypertension: Secondary | ICD-10-CM

## 2011-12-07 NOTE — Progress Notes (Signed)
Subjective:    Patient ID: Lori Greer, female    DOB: 12-26-1970, 41 y.o.   MRN: [id number]  HPI  41 year old patient who is seen today for followup. She has a history of stage V chronic kidney disease and is scheduled to start PD in the near future. She has treated hypertension. She is seen today for followup of her diabetes. She was placed on mealtime insulin approximately one month ago she is using NovoLog 4 units prior to each meal an additional 2 units if blood sugar is in excess of 120. She states that her glycemic control has been excellent and her highest blood sugar has been 136 over the past month since initiating insulin therapy. Since her last visit here she has been seen by cardiology and also by ophthalmology. She has been seen by general surgery for planning of a PD catheter   Past Medical History  Diagnosis Date  . Bell's palsy 05/12/2008  . DIABETES MELLITUS, TYPE II 03/16/2008  . HYPERTENSION 01/06/2010  . OBESITY 03/16/2008  . Proliferative diabetic retinopathy(362.02) 04/23/2009  . STASIS DERMATITIS 07/06/2009  . Fibroid, uterine   . Murmur   . Chronic renal failure     History   Social History  . Marital Status: Married    Spouse Name: N/A    Number of Children: 5  . Years of Education: N/A   Occupational History  . Not on file.   Social History Main Topics  . Smoking status: Former Games developer  . Smokeless tobacco: Never Used  . Alcohol Use: Yes     Comment: rarely  . Drug Use: No  . Sexually Active: Not on file   Other Topics Concern  . Not on file   Social History Narrative  . No narrative on file    Past Surgical History  Procedure Date  . Appendectomy   . Uterine fibroid surgery   . Abdominal hysterectomy     Family History  Problem Relation Age of Onset  . Heart disease Mother     MI at age 32  . Diabetes Father   . Kidney failure Father   . Diabetes Sister     1 sister with diabtes  . Diabetes Brother     No Known  Allergies  Current Outpatient Prescriptions on File Prior to Visit  Medication Sig Dispense Refill  . amLODipine (NORVASC) 10 MG tablet Take 10 mg by mouth daily.       [name] aspirin 81 MG tablet Take 81 mg by mouth daily.      [name] atenolol (TENORMIN) 100 MG tablet Take 1 tablet (100 mg total) by mouth daily.  90 tablet  3  . furosemide (LASIX) 40 MG tablet Take 40 mg by mouth 3 (three) times daily as needed. For swelling or fluid retention.      . hydrALAZINE (APRESOLINE) 50 MG tablet Take 50 mg by mouth 3 (three) times daily.       [name] HYDROcodone-acetaminophen (VICODIN) 5-500 MG per tablet Take 1 tablet by mouth every 8 (eight) hours as needed for pain.  50 tablet  0  . insulin aspart (NOVOLOG FLEXPEN) 100 UNIT/ML injection 4 units prior to each meal; if blood sugar is greater than 160 give a total dose of 6 units prior to each meal  1 vial  12  . metoprolol (LOPRESSOR) 50 MG tablet       . nitroGLYCERIN (NITRODUR - DOSED IN MG/24 HR) 0.4 mg/hr Place 1 patch onto the skin  daily.       . sevelamer (RENVELA) 800 MG tablet Take 800 mg by mouth 3 (three) times daily with meals.      [name] ZEMPLAR 1 MCG capsule Take 1 mcg by mouth daily.       [name] zolpidem (AMBIEN) 5 MG tablet Take 1 tablet (5 mg total) by mouth at bedtime as needed for sleep.  30 tablet  0    BP 126/70  Pulse 68  Temp 98.1 F (36.7 C) (Oral)  Resp 16  Wt 165 lb (74.844 kg)  SpO2 99%       Review of Systems  Constitutional: Negative.   HENT: Negative for hearing loss, congestion, sore throat, rhinorrhea, dental problem, sinus pressure and tinnitus.   Eyes: Negative for pain, discharge and visual disturbance.  Respiratory: Negative for cough and shortness of breath.   Cardiovascular: Negative for chest pain, palpitations and leg swelling.  Gastrointestinal: Negative for nausea, vomiting, abdominal pain, diarrhea, constipation, blood in stool and abdominal distention.  Genitourinary: Negative for dysuria, urgency, frequency,  hematuria, flank pain, vaginal bleeding, vaginal discharge, difficulty urinating, vaginal pain and pelvic pain.  Musculoskeletal: Positive for back pain. Negative for joint swelling, arthralgias and gait problem.  Skin: Negative for rash.  Neurological: Negative for dizziness, syncope, speech difficulty, weakness, numbness and headaches.  Hematological: Negative for adenopathy.  Psychiatric/Behavioral: Negative for behavioral problems, dysphoric mood and agitation. The patient is not nervous/anxious.        Objective:   Physical Exam  Constitutional: She is oriented to person, place, and time. She appears well-developed and well-nourished.       Blood pressure 120/70 right arm and 130/70 left arm  HENT:  Head: Normocephalic.  Right Ear: External ear normal.  Left Ear: External ear normal.  Mouth/Throat: Oropharynx is clear and moist.  Eyes: Conjunctivae normal and EOM are normal. Pupils are equal, round, and reactive to light.  Neck: Normal range of motion. Neck supple. No thyromegaly present.       No audible bruit noted  Cardiovascular: Normal rate, regular rhythm, normal heart sounds and intact distal pulses.        Grade 1/6 systolic murmur  Pulmonary/Chest: Effort normal and breath sounds normal.  Abdominal: Soft. Bowel sounds are normal. She exhibits no mass. There is no tenderness.  Musculoskeletal: Normal range of motion.  Lymphadenopathy:    She has no cervical adenopathy.  Neurological: She is alert and oriented to person, place, and time.  Skin: Skin is warm and dry. No rash noted.  Psychiatric: She has a normal mood and affect. Her behavior is normal.          Assessment & Plan:   Diabetes mellitus. Appears to be well controlled. We'll check a hemoglobin A1c but this will reflect  2 months of pre-insulin therapy Hypertension stable

## 2011-12-07 NOTE — Patient Instructions (Signed)
Limit your sodium (Salt) intake   Please check your hemoglobin A1c every 3 months   Please check your hemoglobin A1c every 3 months

## 2011-12-08 ENCOUNTER — Encounter (INDEPENDENT_AMBULATORY_CARE_PROVIDER_SITE_OTHER): Payer: Self-pay

## 2011-12-08 DIAGNOSIS — R0989 Other specified symptoms and signs involving the circulatory and respiratory systems: Secondary | ICD-10-CM

## 2011-12-14 ENCOUNTER — Telehealth (INDEPENDENT_AMBULATORY_CARE_PROVIDER_SITE_OTHER): Payer: Self-pay | Admitting: General Surgery

## 2011-12-14 ENCOUNTER — Telehealth: Payer: Self-pay

## 2011-12-14 NOTE — Telephone Encounter (Signed)
I have been trying to get in touch with pt to schedule follow up appt to discuss nuclear test received from [place]. Will forward for dr Jens Som review

## 2011-12-14 NOTE — Telephone Encounter (Signed)
Fu ov as requested Lori Greer

## 2011-12-14 NOTE — Telephone Encounter (Signed)
Called patient back and told her that I was waiting on Dr Jens Som for clearance for surgery and once I get the clearance we will schedule her surgery

## 2011-12-14 NOTE — Telephone Encounter (Signed)
[facility] with [facility] called for update on pt's cardiac clearance. Pt. Is having peritoneal dialysis catheter placement with Dr. Derrell Lolling. Please advise.

## 2011-12-14 NOTE — Telephone Encounter (Signed)
Randa Evens from [facility] called for pt Lori Greer, she's checking on scheduling process, have we received clearance? Pt wondering if she needs to be seen again also? Joanne/ ph- [phone] ext- [phone]/ Please advise/gy

## 2011-12-15 NOTE — Telephone Encounter (Signed)
Spoke with pt, Follow up scheduled. Spoke with annie, aware will need f/u before clearance can be given

## 2011-12-25 ENCOUNTER — Telehealth: Payer: Self-pay | Admitting: Cardiology

## 2011-12-25 ENCOUNTER — Encounter: Payer: PRIVATE HEALTH INSURANCE | Admitting: Cardiology

## 2011-12-25 NOTE — Telephone Encounter (Signed)
Spoke with pt, aware the nuclear test she had in [place] showed mild to moderate defect. She will need to be seen prior to getting clearance. Pt voiced understanding.

## 2011-12-25 NOTE — Progress Notes (Signed)
HPI: 41 year old female I initially saw in Nov 2013 for evaluation of chest pain. Patient has chronic renal failure and is nearing dialysis. Outside records were reviewed and there is a nuclear study from her August of 2013 that showed an ejection fraction of 47%. There was mild to moderate ischemia in the anterolateral wall.  Echocardiogram in November of 2013 showed an ejection fraction of 60%, moderate left ventricular hypertrophy, mild biatrial enlargement, trivial pericardial effusion. Carotid Dopplers in November 2013 were normal.   Current Outpatient Prescriptions  Medication Sig Dispense Refill  . amLODipine (NORVASC) 10 MG tablet Take 10 mg by mouth daily.       [name] aspirin 81 MG tablet Take 81 mg by mouth daily.      [name] atenolol (TENORMIN) 100 MG tablet Take 1 tablet (100 mg total) by mouth daily.  90 tablet  3  . furosemide (LASIX) 40 MG tablet Take 40 mg by mouth 3 (three) times daily as needed. For swelling or fluid retention.      . hydrALAZINE (APRESOLINE) 50 MG tablet Take 50 mg by mouth 3 (three) times daily.       [name] HYDROcodone-acetaminophen (VICODIN) 5-500 MG per tablet Take 1 tablet by mouth every 8 (eight) hours as needed for pain.  50 tablet  0  . insulin aspart (NOVOLOG FLEXPEN) 100 UNIT/ML injection 4 units prior to each meal; if blood sugar is greater than 160 give a total dose of 6 units prior to each meal  1 vial  12  . metoprolol (LOPRESSOR) 50 MG tablet       . nitroGLYCERIN (NITRODUR - DOSED IN MG/24 HR) 0.4 mg/hr Place 1 patch onto the skin daily.       . sevelamer (RENVELA) 800 MG tablet Take 800 mg by mouth 3 (three) times daily with meals.      [name] ZEMPLAR 1 MCG capsule Take 1 mcg by mouth daily.       [name] zolpidem (AMBIEN) 5 MG tablet Take 1 tablet (5 mg total) by mouth at bedtime as needed for sleep.  30 tablet  0     Past Medical History  Diagnosis Date  . Bell's palsy 05/12/2008  . DIABETES MELLITUS, TYPE II 03/16/2008  . HYPERTENSION 01/06/2010  . OBESITY  03/16/2008  . Proliferative diabetic retinopathy(362.02) 04/23/2009  . STASIS DERMATITIS 07/06/2009  . Fibroid, uterine   . Murmur   . Chronic renal failure     Past Surgical History  Procedure Date  . Appendectomy   . Uterine fibroid surgery   . Abdominal hysterectomy     History   Social History  . Marital Status: Married    Spouse Name: N/A    Number of Children: 5  . Years of Education: N/A   Occupational History  . Not on file.   Social History Main Topics  . Smoking status: Former Games developer  . Smokeless tobacco: Never Used  . Alcohol Use: Yes     Comment: rarely  . Drug Use: No  . Sexually Active: Not on file   Other Topics Concern  . Not on file   Social History Narrative  . No narrative on file    ROS: no fevers or chills, productive cough, hemoptysis, dysphasia, odynophagia, melena, hematochezia, dysuria, hematuria, rash, seizure activity, orthopnea, PND, pedal edema, claudication. Remaining systems are negative.  Physical Exam: Well-developed well-nourished in no acute distress.  Skin is warm and dry.  HEENT is normal.  Neck is supple.  Chest  is clear to auscultation with normal expansion.  Cardiovascular exam is regular rate and rhythm.  Abdominal exam nontender or distended. No masses palpated. Extremities show no edema. neuro grossly intact       This encounter was created in error - please disregard.

## 2011-12-25 NOTE — Telephone Encounter (Signed)
New Problem:    Patient called in wanting to consult with you about her stress test form [place] so she can have her surgery.  Please call back.

## 2012-01-01 ENCOUNTER — Telehealth: Payer: Self-pay | Admitting: Internal Medicine

## 2012-01-01 ENCOUNTER — Other Ambulatory Visit: Payer: Self-pay | Admitting: Internal Medicine

## 2012-01-01 MED ORDER — GLUCOSE BLOOD VI STRP: ORAL_STRIP | Status: DC

## 2012-01-01 NOTE — Telephone Encounter (Signed)
Patient called stating that she would like to have a new glucose meter with all of the necessary testing supplies sent to her pharmacy [facility] on [address] as her old one has broken. Please assist.

## 2012-01-01 NOTE — Telephone Encounter (Signed)
Spoke to pt asked her what meter she had. Pt stated Freestyle. Told pt she can come by here to pick one up for free and I will send Rx in for Test strips. Pt verbalized understanding. Rx sent to pharmacy for test strips.

## 2012-01-03 ENCOUNTER — Encounter (INDEPENDENT_AMBULATORY_CARE_PROVIDER_SITE_OTHER): Payer: BC Managed Care – PPO | Admitting: Ophthalmology

## 2012-01-04 ENCOUNTER — Telehealth: Payer: Self-pay | Admitting: Internal Medicine

## 2012-01-04 NOTE — Telephone Encounter (Signed)
Patient Information:  Caller Name: Zaylynn  Phone: [phone]  Patient: Lori Greer, Lori Greer  Gender: Female  DOB: [birth year]  Age: 41 Years  PCP: Eleonore Chiquito [facility])  Pregnant: No  Office Follow Up:  Does the office need to follow up with this patient?: Yes  Instructions For The Office: Please follow up on script refill request - Preferred Pharmacy is [facility] as listed in chart.  Or follow up with office visit disposition see in 2 weeks if evaluation is recommended.  RN Note:  Pain 8/10 in lower back worsens with standing or sitting to long.  Patient states that there is no change in her symptoms of chronic back pain however she is out of Hydrocodone/Acetaminophen 5/500.  Last prescription was filled 11/14/11 for Qty of 50 tablets take 1 tablet every 8 hours for pain as needed.  Symptoms  Reason For Call & Symptoms: Back pain  Reviewed Health History In EMR: Yes  Reviewed Medications In EMR: Yes  Reviewed Allergies In EMR: Yes  Reviewed Surgeries / Procedures: Yes  Date of Onset of Symptoms: 12/14/2011  Treatments Tried: Ambien, Hydrocodone/Acetaminophen  Treatments Tried Worked: No OB:  LMP: Unknown  Guideline(s) Used:  Back Pain  Disposition Per Guideline:   See Within 2 Weeks in Office  Reason For Disposition Reached:   Back pain is a chronic symptom (recurrent or ongoing AND lasting > 4 weeks)  Advice Given:  N/A

## 2012-01-04 NOTE — Telephone Encounter (Signed)
Left message on voicemail, Rx refill for VIcodin was called into pharmacy.

## 2012-01-05 MED ORDER — HYDROCODONE-ACETAMINOPHEN 5-500 MG PO TABS: 1.0000 | ORAL_TABLET | Freq: Three times a day (TID) | ORAL | Status: DC | PRN

## 2012-01-05 NOTE — Telephone Encounter (Signed)
Pt notified Rx refill for Vicodin was called into pharmacy.

## 2012-01-10 ENCOUNTER — Encounter: Payer: Self-pay | Admitting: Cardiology

## 2012-01-10 ENCOUNTER — Ambulatory Visit (INDEPENDENT_AMBULATORY_CARE_PROVIDER_SITE_OTHER): Payer: Medicaid Other | Admitting: Cardiology

## 2012-01-10 ENCOUNTER — Other Ambulatory Visit: Payer: Self-pay | Admitting: Internal Medicine

## 2012-01-10 VITALS — BP 158/72 | HR 60 | Ht 62.0 in | Wt 172.0 lb

## 2012-01-10 DIAGNOSIS — N189 Chronic kidney disease, unspecified: Secondary | ICD-10-CM

## 2012-01-10 DIAGNOSIS — I10 Essential (primary) hypertension: Secondary | ICD-10-CM

## 2012-01-10 DIAGNOSIS — Z01810 Encounter for preprocedural cardiovascular examination: Secondary | ICD-10-CM

## 2012-01-10 DIAGNOSIS — R9430 Abnormal result of cardiovascular function study, unspecified: Secondary | ICD-10-CM | POA: Insufficient documentation

## 2012-01-10 LAB — BASIC METABOLIC PANEL
BUN: 67 mg/dL — ABNORMAL HIGH (ref 6–23)
CO2: 17 mEq/L — ABNORMAL LOW (ref 19–32)
Calcium: 8.7 mg/dL (ref 8.4–10.5)
Chloride: 112 mEq/L (ref 96–112)
Creatinine, Ser: 6.6 mg/dL (ref 0.4–1.2)
GFR: 8.81 mL/min — CL (ref >60.00)
Glucose, Bld: 110 mg/dL — ABNORMAL HIGH (ref 70–99)
Potassium: 3.9 mEq/L (ref 3.5–5.1)
Sodium: 136 mEq/L (ref 135–145)

## 2012-01-10 MED ORDER — PRAVASTATIN SODIUM 40 MG PO TABS: 40.0000 mg | ORAL_TABLET | Freq: Every evening | ORAL | Status: DC

## 2012-01-10 NOTE — Telephone Encounter (Signed)
Pt states the pharm never received refill request for glucose blood test strip. Can you resend? [facility] / [address]

## 2012-01-10 NOTE — Assessment & Plan Note (Signed)
Continue present blood pressure medications. 

## 2012-01-10 NOTE — Assessment & Plan Note (Signed)
Followed by nephrology. 

## 2012-01-10 NOTE — Assessment & Plan Note (Addendum)
We will ultimately plan cardiac catheterization as described under the section of preoperative evaluation. Continue aspirin. Add Pravachol 40 mg daily. Check lipids and liver in 6 weeks.   Addendum on 02/02/2012-discussed with Dr. Lowell Guitar. They have initiated hemodialysis. We will proceed with cardiac catheterization. Once coronaries addressed we'll plan peritoneal dialysis catheter placement.

## 2012-01-10 NOTE — Progress Notes (Signed)
HPI: 41 year old female I initially saw in Nov 2013 for evaluation of chest pain. Patient has chronic renal failure and is nearing dialysis. She is scheduled to have peritoneal dialysis catheter placement and we were asked to evaluate prior to that as well. Echocardiogram in November of 2013 showed normal LV function, moderate left ventricular hypertrophy, mild biatrial enlargement and trace tricuspid regurgitation. Carotid Dopplers in November of 2013 were normal. Outside records were reviewed and a nuclear study in August of 2013 from [place] showed mild to moderate anterior ischemia. Ejection fraction was 47%. Since she was last seen, she has mild dyspnea on exertion but no exertional chest pain. Occasional mild pedal edema controlled with diuretics.   Current Outpatient Prescriptions  Medication Sig Dispense Refill  . amLODipine (NORVASC) 10 MG tablet Take 10 mg by mouth daily.       [name] aspirin 81 MG tablet Take 81 mg by mouth daily.      [name] atenolol (TENORMIN) 100 MG tablet Take 1 tablet (100 mg total) by mouth daily.  90 tablet  3  . furosemide (LASIX) 40 MG tablet Take 40 mg by mouth 3 (three) times daily as needed. For swelling or fluid retention.      [name] glucose blood test strip Check blood sugars twice a day.  Diagnosis: 250 00  100 each  12  . hydrALAZINE (APRESOLINE) 50 MG tablet Take 50 mg by mouth 3 (three) times daily.       [name] HYDROcodone-acetaminophen (VICODIN) 5-500 MG per tablet Take 1 tablet by mouth every 8 (eight) hours as needed for pain.  50 tablet  0  . insulin aspart (NOVOLOG FLEXPEN) 100 UNIT/ML injection 4 units prior to each meal; if blood sugar is greater than 160 give a total dose of 6 units prior to each meal  1 vial  12  . metoprolol (LOPRESSOR) 50 MG tablet Take 50 mg by mouth 2 (two) times daily.       . nitroGLYCERIN (NITRODUR - DOSED IN MG/24 HR) 0.4 mg/hr Place 1 patch onto the skin daily.       . sevelamer (RENVELA) 800 MG tablet Take 800 mg by mouth 3 (three)  times daily with meals.      [name] ZEMPLAR 1 MCG capsule Take 1 mcg by mouth daily.       [name] zolpidem (AMBIEN) 5 MG tablet take 1 tablet by mouth at bedtime if needed for sleep  30 tablet  0     Past Medical History  Diagnosis Date  . Bell's palsy 05/12/2008  . DIABETES MELLITUS, TYPE II 03/16/2008  . HYPERTENSION 01/06/2010  . OBESITY 03/16/2008  . Proliferative diabetic retinopathy(362.02) 04/23/2009  . STASIS DERMATITIS 07/06/2009  . Fibroid, uterine   . Murmur   . Chronic renal failure     Past Surgical History  Procedure Date  . Appendectomy   . Uterine fibroid surgery   . Abdominal hysterectomy     History   Social History  . Marital Status: Married    Spouse Name: N/A    Number of Children: 5  . Years of Education: N/A   Occupational History  . Not on file.   Social History Main Topics  . Smoking status: Former Games developer  . Smokeless tobacco: Never Used  . Alcohol Use: Yes     Comment: rarely  . Drug Use: No  . Sexually Active: Not on file   Other Topics Concern  . Not on file   Social History  Narrative  . No narrative on file    ROS: no fevers or chills, productive cough, hemoptysis, dysphasia, odynophagia, melena, hematochezia, dysuria, hematuria, rash, seizure activity, orthopnea, PND, pedal edema, claudication. Remaining systems are negative.  Physical Exam: Well-developed well-nourished in no acute distress.  Skin is warm and dry.  HEENT is normal.  Neck is supple.  Chest is clear to auscultation with normal expansion.  Cardiovascular exam is regular rate and rhythm.  Abdominal exam nontender or distended. No masses palpated. Extremities show no edema. neuro grossly intact

## 2012-01-10 NOTE — Patient Instructions (Addendum)
Your physician recommends that you schedule a follow-up appointment in: 3 MONTHS WITH DR CRENSHAW   START PRAVASTATIN 40 MG ONCE DAILY  Your physician recommends that you return for lab work in: 6 WEEKS =FASTING

## 2012-01-10 NOTE — Telephone Encounter (Signed)
Called Test strips into pharmacy and Lancets. Pt notified and stated she uses [facility].

## 2012-01-10 NOTE — Assessment & Plan Note (Signed)
I have obtained a copy of the results of the patient's nuclear study. I do not have the images to review. She is described as having mild to moderate anterior ischemia. She also has approximately 15 years of diabetes mellitus. I discussed the case with Dr. Lowell Guitar today. If her peritoneal dialysis catheter can be placed without general anesthesia then I think it would be reasonable to proceed and wants dialysis initiated we could pursue catheterization later. If she requires general anesthesia and we will most likely need to arrange cardiac catheterization followed by initiation of dialysis the next day or 2. Dr. Lowell Guitar stated that her previous creatinine was in the 8 range. Therefore any contrast would obviously precipitate renal failure. Note the patient is not having significant exertional chest pain.

## 2012-01-11 ENCOUNTER — Encounter (INDEPENDENT_AMBULATORY_CARE_PROVIDER_SITE_OTHER): Payer: Self-pay | Admitting: Ophthalmology

## 2012-01-11 ENCOUNTER — Other Ambulatory Visit: Payer: Self-pay | Admitting: *Deleted

## 2012-01-11 MED ORDER — ACCU-CHEK FASTCLIX LANCETS MISC: 1.0000 | Freq: Every day | Status: DC

## 2012-01-11 MED ORDER — GLUCOSE BLOOD VI STRP: ORAL_STRIP | Status: DC

## 2012-01-11 NOTE — Telephone Encounter (Signed)
Nettie Elm called from [employer] stating pt needs Accu-Check supplies ordered for insurance purpose. Gave verbal order for Accu-Check test strips and lancets.

## 2012-01-15 ENCOUNTER — Telehealth: Payer: Self-pay | Admitting: Cardiology

## 2012-01-15 ENCOUNTER — Encounter: Payer: Self-pay | Admitting: Cardiology

## 2012-01-15 ENCOUNTER — Telehealth (INDEPENDENT_AMBULATORY_CARE_PROVIDER_SITE_OTHER): Payer: Self-pay

## 2012-01-15 NOTE — Telephone Encounter (Signed)
New problem:  Test results.  

## 2012-01-15 NOTE — Telephone Encounter (Signed)
Message copied by Ivory Broad on Mon Jan 15, 2012  5:04 PM ------      Message from: Axel Filler      Created: Mon Jan 15, 2012  4:23 PM      Regarding: RE: checking on surgery       Contact: [phone]       She will most likely need general anesthesia.  Most patients don't tolerated pneumoperitoneum while awake b/c of the pressure.  I'd rather her have the cardiac cath done first.            Thank      AR            ----- Message -----         From: Ivory Broad, RN         Sent: 01/15/2012   3:29 PM           To: Axel Filler, MD      Subject: Annell Greening: checking on surgery                                  Please read Dr Ludwig Clarks note from 12/18.      Thanks,      Huntley Dec      ----- Message -----         From: Debbe Odea, RN         Sent: 01/15/2012   3:18 PM           To: Ivory Broad, RN, Wilder Glade, MA      Subject: checking on surgery                                      Pt was calling to check on the cardiac clearance for her to get the PD catheter.  Told her that because of the holidays, may be a while to hear from Dr. Ludwig Clarks office and get a plan with Dr. Derrell Lolling working.  bp

## 2012-01-15 NOTE — Telephone Encounter (Signed)
PER PT  HAD LABS DONE, PER DR CRENSHAW FORWARD COPY TO DR POWELL.  PT NOTIFIED TO CONTACT DR POWELL'S OFF RE  ELEVATED CR AND BUN PT VERBALIZED UNDERSTANDING./CY

## 2012-01-15 NOTE — Telephone Encounter (Signed)
I called and let the pt know that we reviewed Dr Ludwig Clarks note and he does not recommend general anesthesia.  She may need a cardiac cath 1st.  I told her Dr Derrell Lolling would need general anesthesia.  I asked her to contact Dr Ludwig Clarks office to see if they need to go ahead a schedule a catheterization for her.

## 2012-01-15 NOTE — Telephone Encounter (Signed)
Pt would like lab results she called earlier but no message was taken and she was told to call her kidney provider but he has no results for her

## 2012-01-16 NOTE — Telephone Encounter (Signed)
Pt was told that lab results were sent to Dr Lowell Guitar.

## 2012-01-19 ENCOUNTER — Telehealth: Payer: Self-pay | Admitting: Cardiology

## 2012-01-19 ENCOUNTER — Telehealth (INDEPENDENT_AMBULATORY_CARE_PROVIDER_SITE_OTHER): Payer: Self-pay | Admitting: General Surgery

## 2012-01-19 NOTE — Telephone Encounter (Signed)
Patient called to see why see was not scheduled for surgery yet. I reviewed phone note where patient was advised to call Dr Ludwig Clarks office about a cardiac cath prior to receiving clearance. She will contact them.

## 2012-01-19 NOTE — Telephone Encounter (Signed)
Pt called because she is ready to schedule her cath so she can go ahead with her other procedure at [facility]. I explained to pt Dr. Jens Som would be back on Monday and Debra would call one day next week to schedule

## 2012-01-22 NOTE — Telephone Encounter (Signed)
Spoke with pt, will need to coordinate cath with dr Lowell Guitar because the pt will need dialysis the day after the cath. Left message for dr Lowell Guitar assistant to call me back.

## 2012-01-24 DIAGNOSIS — I2510 Atherosclerotic heart disease of native coronary artery without angina pectoris: Secondary | ICD-10-CM

## 2012-01-24 HISTORY — DX: Atherosclerotic heart disease of native coronary artery without angina pectoris: I25.10

## 2012-01-24 HISTORY — PX: INSERTION OF DIALYSIS CATHETER: SHX1324

## 2012-01-25 ENCOUNTER — Other Ambulatory Visit (HOSPITAL_COMMUNITY): Payer: Self-pay | Admitting: Nephrology

## 2012-01-25 DIAGNOSIS — N186 End stage renal disease: Secondary | ICD-10-CM

## 2012-01-30 ENCOUNTER — Ambulatory Visit (HOSPITAL_COMMUNITY)
Admission: RE | Admit: 2012-01-30 | Discharge: 2012-01-30 | Disposition: A | Discharge status: Home / Self Care | Payer: Medicaid Other | Source: Ambulatory Visit | Attending: Nephrology | Admitting: Nephrology

## 2012-01-30 ENCOUNTER — Encounter (HOSPITAL_COMMUNITY): Payer: Self-pay

## 2012-01-30 ENCOUNTER — Other Ambulatory Visit (HOSPITAL_COMMUNITY): Payer: Self-pay | Admitting: Nephrology

## 2012-01-30 DIAGNOSIS — N186 End stage renal disease: Secondary | ICD-10-CM

## 2012-01-30 DIAGNOSIS — Z4901 Encounter for fitting and adjustment of extracorporeal dialysis catheter: Secondary | ICD-10-CM | POA: Insufficient documentation

## 2012-01-30 DIAGNOSIS — N185 Chronic kidney disease, stage 5: Secondary | ICD-10-CM | POA: Insufficient documentation

## 2012-01-30 DIAGNOSIS — Z992 Dependence on renal dialysis: Secondary | ICD-10-CM | POA: Insufficient documentation

## 2012-01-30 DIAGNOSIS — I120 Hypertensive chronic kidney disease with stage 5 chronic kidney disease or end stage renal disease: Secondary | ICD-10-CM | POA: Insufficient documentation

## 2012-01-30 DIAGNOSIS — E119 Type 2 diabetes mellitus without complications: Secondary | ICD-10-CM | POA: Insufficient documentation

## 2012-01-30 LAB — BASIC METABOLIC PANEL
BUN: 85 mg/dL — ABNORMAL HIGH (ref 6–23)
CO2: 18 mEq/L — ABNORMAL LOW (ref 19–32)
Calcium: 9.3 mg/dL (ref 8.4–10.5)
Chloride: 101 mEq/L (ref 96–112)
Creatinine, Ser: 8.67 mg/dL — ABNORMAL HIGH (ref 0.50–1.10)
GFR calc Af Amer: 6 mL/min — ABNORMAL LOW (ref >90)
GFR calc non Af Amer: 5 mL/min — ABNORMAL LOW (ref >90)
Glucose, Bld: 98 mg/dL (ref 70–99)
Potassium: 3.5 mEq/L (ref 3.5–5.1)
Sodium: 136 mEq/L (ref 135–145)

## 2012-01-30 LAB — CBC
HCT: 20.3 % — ABNORMAL LOW (ref 36.0–46.0)
Hemoglobin: 7.1 g/dL — ABNORMAL LOW (ref 12.0–15.0)
MCH: 28.7 pg (ref 26.0–34.0)
MCHC: 35 g/dL (ref 30.0–36.0)
MCV: 82.2 fL (ref 78.0–100.0)
Platelets: 176 10*3/uL (ref 150–400)
RBC: 2.47 MIL/uL — ABNORMAL LOW (ref 3.87–5.11)
RDW: 14.4 % (ref 11.5–15.5)
WBC: 6.1 10*3/uL (ref 4.0–10.5)

## 2012-01-30 LAB — GLUCOSE, CAPILLARY: Glucose-Capillary: 89 mg/dL (ref 70–99)

## 2012-01-30 LAB — PROTIME-INR
INR: 0.99 (ref 0.00–1.49)
Prothrombin Time: 13 seconds (ref 11.6–15.2)

## 2012-01-30 LAB — APTT: aPTT: 29 seconds (ref 24–37)

## 2012-01-30 MED ORDER — SODIUM CHLORIDE 0.9 % IV SOLN
Freq: Once | INTRAVENOUS | Status: AC
  Administered 2012-01-30: 20 mL/h via INTRAVENOUS

## 2012-01-30 MED ORDER — FENTANYL CITRATE 0.05 MG/ML IJ SOLN
INTRAMUSCULAR | Status: AC | PRN
  Administered 2012-01-30: 50 ug via INTRAVENOUS

## 2012-01-30 MED ORDER — MIDAZOLAM HCL 2 MG/2ML IJ SOLN
INTRAMUSCULAR | Status: AC | PRN
  Administered 2012-01-30: 2 mg via INTRAVENOUS

## 2012-01-30 MED ORDER — FENTANYL CITRATE 0.05 MG/ML IJ SOLN
INTRAMUSCULAR | Status: AC
  Filled 2012-01-30: qty 4

## 2012-01-30 MED ORDER — CEFAZOLIN SODIUM 1-5 GM-% IV SOLN
1.0000 g | Freq: Once | INTRAVENOUS | Status: AC
  Administered 2012-01-30: 1 g via INTRAVENOUS
  Filled 2012-01-30: qty 50

## 2012-01-30 MED ORDER — HEPARIN SODIUM (PORCINE) 1000 UNIT/ML IJ SOLN
INTRAMUSCULAR | Status: AC
  Filled 2012-01-30: qty 1

## 2012-01-30 MED ORDER — MIDAZOLAM HCL 2 MG/2ML IJ SOLN
INTRAMUSCULAR | Status: AC
  Filled 2012-01-30: qty 4

## 2012-01-30 MED ORDER — GELATIN ABSORBABLE 12-7 MM EX MISC
CUTANEOUS | Status: AC
  Filled 2012-01-30: qty 1

## 2012-01-30 NOTE — H&P (Signed)
Lori Greer is an 42 y.o. female.   Chief Complaint: Chronic Kidney dz - stage 5 per Dr Lowell Guitar Pt desires peritoneal dialysis. Needs cardiac clearance for this and will undergo Cardiac cath soon. Pt awaiting scheduling for both Cardiac cath and peritoneal dialysis catheter placement Now needs hemo dailysis catheter placement until all this Can be arranged and prepared for PD Scheduled for [facility] Catheter today in IR HPI: DM; HTN; heart murmur  Past Medical History  Diagnosis Date  . Bell's palsy 05/12/2008  . DIABETES MELLITUS, TYPE II 03/16/2008  . HYPERTENSION 01/06/2010  . OBESITY 03/16/2008  . Proliferative diabetic retinopathy(362.02) 04/23/2009  . STASIS DERMATITIS 07/06/2009  . Fibroid, uterine   . Murmur   . Chronic renal failure     Past Surgical History  Procedure Date  . Appendectomy   . Uterine fibroid surgery   . Abdominal hysterectomy     Family History  Problem Relation Age of Onset  . Heart disease Mother     MI at age 39  . Diabetes Father   . Kidney failure Father   . Diabetes Sister     1 sister with diabtes  . Diabetes Brother    Social History:  reports that she has quit smoking. She has never used smokeless tobacco. She reports that she drinks alcohol. She reports that she does not use illicit drugs.  Allergies: No Known Allergies   (Not in a hospital admission)  Results for orders placed during the hospital encounter of 01/30/12 (from the past 48 hour(s))  GLUCOSE, CAPILLARY     Status: Normal   Collection Time   01/30/12  8:56 AM      Component Value Range Comment   Glucose-Capillary 89  70 - 99 mg/dL    No results found.  Review of Systems  Constitutional: Negative for fever.  Respiratory: Negative for shortness of breath.   Cardiovascular: Negative for chest pain.  Gastrointestinal: Negative for nausea and vomiting.  Neurological: Negative for weakness and headaches.    Blood pressure 151/82, pulse 65, temperature 98.3 F (36.8 C),  temperature source Oral, resp. rate 18, height 5\' 10"  (1.778 m), weight 168 lb (76.204 kg), SpO2 100.00%. Physical Exam  Constitutional: She is oriented to person, place, and time. She appears well-nourished.  Cardiovascular: Normal rate and regular rhythm.   Murmur heard. Respiratory: Effort normal and breath sounds normal. She has no wheezes.  GI: Soft. Bowel sounds are normal. There is no tenderness.  Musculoskeletal: Normal range of motion.  Neurological: She is alert and oriented to person, place, and time.  Psychiatric: She has a normal mood and affect. Her behavior is normal. Judgment and thought content normal.     Assessment/Plan CKD - needs dialysis Wants PD but must wait til cardiac clearance per catheterization In mean time; will place HD cath to start hemodialysis per Dr Lowell Guitar Pt sees him again later in month to arrange all other plans Pt aware of procedure benefits and risks and agreeable to proceed Consent signed and in chart  Elzie Sheets A 01/30/2012, 9:59 AM

## 2012-01-30 NOTE — Telephone Encounter (Signed)
Left message for joann with dr Lowell Guitar to call back.

## 2012-01-30 NOTE — Telephone Encounter (Signed)
According to joann with dr Lowell Guitar, the pt is going to get started on dialysis first, then we will cath and then she will have surgery. This is the understanding dr Jens Som has also. Will await further correspondence with dr Lowell Guitar.

## 2012-01-30 NOTE — Progress Notes (Signed)
Discharged home; s/p HD catheter placement; no c/o; dressing CDI

## 2012-01-30 NOTE — H&P (Signed)
Agree with PA note.    Signed,  Emillia Weatherly K. Shaeleigh Graw, MD Vascular & Interventional Radiologist [facility]  

## 2012-01-30 NOTE — Procedures (Signed)
Interventional Radiology Procedure Note  Procedure: Placement of right IJ approach 19 cm tip-to-cuff HemoSplit tunneled HD catheter. Tip in the upper RA and ready for use. Complications: None Recommendations: - Routine line care - May begin dialysis  Signed,  Sterling Big, MD Vascular & Interventional Radiologist [facility]

## 2012-02-02 ENCOUNTER — Encounter: Payer: Self-pay | Admitting: *Deleted

## 2012-02-02 ENCOUNTER — Other Ambulatory Visit: Payer: Self-pay | Admitting: Cardiology

## 2012-02-02 ENCOUNTER — Telehealth: Payer: Self-pay | Admitting: Cardiology

## 2012-02-02 DIAGNOSIS — R9430 Abnormal result of cardiovascular function study, unspecified: Secondary | ICD-10-CM

## 2012-02-02 NOTE — Telephone Encounter (Signed)
Spoke with pt, aware we were waiting on dr Lowell Guitar to contact us. According to dr Lowell Guitar and dr Jens Som the pt is ready for a cath, the pt has dialysis on Tuesday and Thursday. Will get cath scheduled for next week.

## 2012-02-02 NOTE — Telephone Encounter (Signed)
Pt was to have something scheduled for her heart and has not heard back, pls  call [phone]

## 2012-02-02 NOTE — Telephone Encounter (Signed)
Spoke with pt, cath scheduled, instructions over the phone. Letter mailed to pt.

## 2012-02-05 ENCOUNTER — Inpatient Hospital Stay (HOSPITAL_BASED_OUTPATIENT_CLINIC_OR_DEPARTMENT_OTHER): Admit: 2012-02-05 | Payer: Self-pay | Admitting: Cardiovascular Disease

## 2012-02-05 ENCOUNTER — Encounter (HOSPITAL_BASED_OUTPATIENT_CLINIC_OR_DEPARTMENT_OTHER): Payer: Self-pay

## 2012-02-05 SURGERY — JV LEFT HEART CATHETERIZATION WITH CORONARY ANGIOGRAM
Anesthesia: Moderate Sedation

## 2012-02-06 ENCOUNTER — Encounter (HOSPITAL_COMMUNITY): Payer: Self-pay | Admitting: Pharmacy Technician

## 2012-02-07 ENCOUNTER — Encounter (HOSPITAL_COMMUNITY): Admission: RE | Payer: Self-pay | Source: Ambulatory Visit

## 2012-02-07 ENCOUNTER — Ambulatory Visit (HOSPITAL_COMMUNITY): Admission: RE | Admit: 2012-02-07 | Payer: Medicaid Other | Source: Ambulatory Visit | Admitting: Cardiovascular Disease

## 2012-02-07 SURGERY — LEFT HEART CATHETERIZATION WITH CORONARY ANGIOGRAM
Anesthesia: LOCAL

## 2012-02-08 ENCOUNTER — Encounter (INDEPENDENT_AMBULATORY_CARE_PROVIDER_SITE_OTHER): Payer: Self-pay

## 2012-02-08 ENCOUNTER — Telehealth: Payer: Self-pay | Admitting: Cardiology

## 2012-02-08 NOTE — Telephone Encounter (Signed)
Pt is calling to inquire on a blood transfusion she is to have Lori Greer was suppose to call her Tuesday abut this and she has not heard from her

## 2012-02-08 NOTE — Telephone Encounter (Signed)
Spoke to patient was told Dr.Crenshaw's nurse not in office today,will send message to her.

## 2012-02-09 ENCOUNTER — Encounter: Payer: Self-pay | Admitting: Cardiology

## 2012-02-09 NOTE — Telephone Encounter (Signed)
Discussed with dr Jens Som, pt to go to short stay section B for transfusion of one pint of blood Wednesday 02-14-12 @ 9am. Left message for pt to call

## 2012-02-09 NOTE — Telephone Encounter (Signed)
Spoke with pt, will discuss plan with dr Jens Som and call the pt back.

## 2012-02-09 NOTE — Telephone Encounter (Addendum)
Error

## 2012-02-09 NOTE — Telephone Encounter (Signed)
Pt calling regarding blood transfusion

## 2012-02-13 NOTE — Telephone Encounter (Signed)
Spoke with pt, aware to go to short stay for transfusion.

## 2012-02-14 ENCOUNTER — Ambulatory Visit (HOSPITAL_COMMUNITY)
Admission: RE | Admit: 2012-02-14 | Discharge: 2012-02-14 | Disposition: A | Discharge status: Home / Self Care | Payer: Medicaid Other | Source: Ambulatory Visit | Attending: Cardiology | Admitting: Cardiology

## 2012-02-14 DIAGNOSIS — N189 Chronic kidney disease, unspecified: Secondary | ICD-10-CM | POA: Insufficient documentation

## 2012-02-14 DIAGNOSIS — D649 Anemia, unspecified: Secondary | ICD-10-CM | POA: Insufficient documentation

## 2012-02-14 DIAGNOSIS — E119 Type 2 diabetes mellitus without complications: Secondary | ICD-10-CM | POA: Insufficient documentation

## 2012-02-14 LAB — ABO/RH: ABO/RH(D): O POS

## 2012-02-14 LAB — PREPARE RBC (CROSSMATCH)

## 2012-02-14 MED ORDER — FUROSEMIDE 10 MG/ML IJ SOLN
40.0000 mg | Freq: Once | INTRAMUSCULAR | Status: AC
  Administered 2012-02-14: 40 mg via INTRAVENOUS
  Filled 2012-02-14: qty 4

## 2012-02-14 MED ORDER — DIPHENHYDRAMINE HCL 25 MG PO CAPS
25.0000 mg | ORAL_CAPSULE | Freq: Once | ORAL | Status: AC
  Administered 2012-02-14: 25 mg via ORAL
  Filled 2012-02-14: qty 1

## 2012-02-14 NOTE — Progress Notes (Signed)
Blood transfusion complete

## 2012-02-15 LAB — TYPE AND SCREEN
ABO/RH(D): O POS
Antibody Screen: NEGATIVE
Unit division: 0
Unit division: 0

## 2012-02-19 ENCOUNTER — Other Ambulatory Visit (INDEPENDENT_AMBULATORY_CARE_PROVIDER_SITE_OTHER): Payer: Medicaid Other

## 2012-02-19 DIAGNOSIS — N189 Chronic kidney disease, unspecified: Secondary | ICD-10-CM

## 2012-02-19 DIAGNOSIS — R9430 Abnormal result of cardiovascular function study, unspecified: Secondary | ICD-10-CM

## 2012-02-19 DIAGNOSIS — Z01810 Encounter for preprocedural cardiovascular examination: Secondary | ICD-10-CM

## 2012-02-19 DIAGNOSIS — I10 Essential (primary) hypertension: Secondary | ICD-10-CM

## 2012-02-19 LAB — HEPATIC FUNCTION PANEL
ALT: 12 U/L (ref 0–35)
AST: 17 U/L (ref 0–37)
Albumin: 2.7 g/dL — ABNORMAL LOW (ref 3.5–5.2)
Alkaline Phosphatase: 57 U/L (ref 39–117)
Bilirubin, Direct: 0 mg/dL (ref 0.0–0.3)
Total Bilirubin: 0.4 mg/dL (ref 0.3–1.2)
Total Protein: 6.5 g/dL (ref 6.0–8.3)

## 2012-02-19 LAB — LIPID PANEL
Cholesterol: 210 mg/dL — ABNORMAL HIGH (ref 0–200)
HDL: 60.9 mg/dL (ref >39.00)
Total CHOL/HDL Ratio: 3
Triglycerides: 66 mg/dL (ref 0.0–149.0)
VLDL: 13.2 mg/dL (ref 0.0–40.0)

## 2012-02-19 LAB — LDL CHOLESTEROL, DIRECT: Direct LDL: 119.1 mg/dL

## 2012-02-20 ENCOUNTER — Encounter (HOSPITAL_COMMUNITY): Payer: Self-pay | Admitting: Pharmacy Technician

## 2012-02-20 ENCOUNTER — Encounter: Payer: Self-pay | Admitting: *Deleted

## 2012-02-20 ENCOUNTER — Telehealth: Payer: Self-pay | Admitting: Cardiology

## 2012-02-20 NOTE — Telephone Encounter (Signed)
Spoke with pt, cath rescheduled to Friday 02/23/12 @ 10:30am with dr [place]. She is going to need labs repeated and she goes to dialysis on Thursday. She is going to have dialysis call me so we can possibility get labs done there. Instructions given to pt over the phone.

## 2012-02-20 NOTE — Telephone Encounter (Signed)
New Problem   Pt is ready to set up heart cath appointment.

## 2012-02-22 NOTE — Telephone Encounter (Signed)
New Problem    Returning phone call.

## 2012-02-22 NOTE — Telephone Encounter (Signed)
Spoke with pt, the dialysis center is going to give her a copy of her blood work to take to the hosp tomorrow for her cath. She was also asking if the dialysis surgery was going to be tomorrow as well. Explained not sure she would need to talk to dr powell's office regarding the surgery and date. Pt voiced understanding. Cath lab made aware she is bringing lab work to appt tomorrow.

## 2012-02-23 ENCOUNTER — Encounter (HOSPITAL_COMMUNITY): Payer: Self-pay | Admitting: Physician Assistant

## 2012-02-23 ENCOUNTER — Encounter (HOSPITAL_COMMUNITY)
Admission: RE | Disposition: A | Discharge status: Home / Self Care | Payer: Self-pay | Source: Ambulatory Visit | Attending: Cardiology

## 2012-02-23 ENCOUNTER — Ambulatory Visit (HOSPITAL_COMMUNITY)
Admission: RE | Admit: 2012-02-23 | Discharge: 2012-02-24 | Disposition: A | Discharge status: Home / Self Care | Payer: Medicaid Other | Source: Ambulatory Visit | Attending: Cardiology | Admitting: Cardiology

## 2012-02-23 DIAGNOSIS — I2510 Atherosclerotic heart disease of native coronary artery without angina pectoris: Secondary | ICD-10-CM

## 2012-02-23 DIAGNOSIS — Z992 Dependence on renal dialysis: Secondary | ICD-10-CM | POA: Insufficient documentation

## 2012-02-23 DIAGNOSIS — D649 Anemia, unspecified: Secondary | ICD-10-CM | POA: Insufficient documentation

## 2012-02-23 DIAGNOSIS — E669 Obesity, unspecified: Secondary | ICD-10-CM

## 2012-02-23 DIAGNOSIS — N186 End stage renal disease: Secondary | ICD-10-CM | POA: Insufficient documentation

## 2012-02-23 DIAGNOSIS — R9430 Abnormal result of cardiovascular function study, unspecified: Secondary | ICD-10-CM

## 2012-02-23 DIAGNOSIS — E119 Type 2 diabetes mellitus without complications: Secondary | ICD-10-CM | POA: Diagnosis present

## 2012-02-23 DIAGNOSIS — I8310 Varicose veins of unspecified lower extremity with inflammation: Secondary | ICD-10-CM

## 2012-02-23 DIAGNOSIS — I872 Venous insufficiency (chronic) (peripheral): Secondary | ICD-10-CM | POA: Insufficient documentation

## 2012-02-23 DIAGNOSIS — E11359 Type 2 diabetes mellitus with proliferative diabetic retinopathy without macular edema: Secondary | ICD-10-CM

## 2012-02-23 DIAGNOSIS — I10 Essential (primary) hypertension: Secondary | ICD-10-CM | POA: Diagnosis present

## 2012-02-23 DIAGNOSIS — I209 Angina pectoris, unspecified: Secondary | ICD-10-CM | POA: Insufficient documentation

## 2012-02-23 DIAGNOSIS — Z955 Presence of coronary angioplasty implant and graft: Secondary | ICD-10-CM

## 2012-02-23 DIAGNOSIS — R9439 Abnormal result of other cardiovascular function study: Secondary | ICD-10-CM | POA: Diagnosis present

## 2012-02-23 DIAGNOSIS — R0989 Other specified symptoms and signs involving the circulatory and respiratory systems: Secondary | ICD-10-CM

## 2012-02-23 DIAGNOSIS — R011 Cardiac murmur, unspecified: Secondary | ICD-10-CM

## 2012-02-23 DIAGNOSIS — G510 Bell's palsy: Secondary | ICD-10-CM | POA: Insufficient documentation

## 2012-02-23 DIAGNOSIS — R079 Chest pain, unspecified: Secondary | ICD-10-CM

## 2012-02-23 DIAGNOSIS — R609 Edema, unspecified: Secondary | ICD-10-CM

## 2012-02-23 DIAGNOSIS — Z01810 Encounter for preprocedural cardiovascular examination: Secondary | ICD-10-CM

## 2012-02-23 DIAGNOSIS — N189 Chronic kidney disease, unspecified: Secondary | ICD-10-CM | POA: Diagnosis present

## 2012-02-23 DIAGNOSIS — I120 Hypertensive chronic kidney disease with stage 5 chronic kidney disease or end stage renal disease: Secondary | ICD-10-CM | POA: Insufficient documentation

## 2012-02-23 HISTORY — DX: Atherosclerotic heart disease of native coronary artery without angina pectoris: I25.10

## 2012-02-23 HISTORY — PX: CORONARY ANGIOGRAM: SHX5466

## 2012-02-23 HISTORY — PX: PERCUTANEOUS CORONARY STENT INTERVENTION (PCI-S): SHX5485

## 2012-02-23 HISTORY — DX: Anemia, unspecified: D64.9

## 2012-02-23 LAB — BASIC METABOLIC PANEL
BUN: 19 mg/dL (ref 6–23)
CO2: 33 mEq/L — ABNORMAL HIGH (ref 19–32)
Calcium: 8.6 mg/dL (ref 8.4–10.5)
Chloride: 101 mEq/L (ref 96–112)
Creatinine, Ser: 3.92 mg/dL — ABNORMAL HIGH (ref 0.50–1.10)
GFR calc Af Amer: 15 mL/min — ABNORMAL LOW (ref >90)
GFR calc non Af Amer: 13 mL/min — ABNORMAL LOW (ref >90)
Glucose, Bld: 98 mg/dL (ref 70–99)
Potassium: 3.1 mEq/L — ABNORMAL LOW (ref 3.5–5.1)
Sodium: 140 mEq/L (ref 135–145)

## 2012-02-23 LAB — POCT ACTIVATED CLOTTING TIME
Activated Clotting Time: 328 seconds
Activated Clotting Time: 209 seconds
Activated Clotting Time: 187 seconds
Activated Clotting Time: 176 seconds

## 2012-02-23 LAB — GLUCOSE, CAPILLARY
Glucose-Capillary: 101 mg/dL — ABNORMAL HIGH (ref 70–99)
Glucose-Capillary: 81 mg/dL (ref 70–99)
Glucose-Capillary: 204 mg/dL — ABNORMAL HIGH (ref 70–99)

## 2012-02-23 LAB — PROTIME-INR
INR: 0.86 (ref 0.00–1.49)
Prothrombin Time: 11.7 seconds (ref 11.6–15.2)

## 2012-02-23 LAB — CBC
HCT: 33.3 % — ABNORMAL LOW (ref 36.0–46.0)
Hemoglobin: 11 g/dL — ABNORMAL LOW (ref 12.0–15.0)
MCH: 29.6 pg (ref 26.0–34.0)
MCHC: 33 g/dL (ref 30.0–36.0)
MCV: 89.8 fL (ref 78.0–100.0)
Platelets: 223 10*3/uL (ref 150–400)
RBC: 3.71 MIL/uL — ABNORMAL LOW (ref 3.87–5.11)
RDW: 14.4 % (ref 11.5–15.5)
WBC: 6.2 10*3/uL (ref 4.0–10.5)

## 2012-02-23 SURGERY — CORONARY ANGIOGRAM

## 2012-02-23 MED ORDER — NITROGLYCERIN 1 MG/10 ML FOR IR/CATH LAB
INTRA_ARTERIAL | Status: AC
  Filled 2012-02-23: qty 10

## 2012-02-23 MED ORDER — ACETAMINOPHEN 325 MG PO TABS: 650.0000 mg | ORAL_TABLET | ORAL | Status: DC | PRN

## 2012-02-23 MED ORDER — DIALYVITE 3000 3 MG PO TABS: 1.0000 | ORAL_TABLET | Freq: Every day | ORAL | Status: DC

## 2012-02-23 MED ORDER — LABETALOL HCL 5 MG/ML IV SOLN
10.0000 mg | Freq: Once | INTRAVENOUS | Status: AC
  Administered 2012-02-23: 16:00:00 10 mg via INTRAVENOUS
  Filled 2012-02-23: qty 4

## 2012-02-23 MED ORDER — ASPIRIN 81 MG PO CHEW
324.0000 mg | CHEWABLE_TABLET | ORAL | Status: AC
  Administered 2012-02-23: 324 mg via ORAL
  Filled 2012-02-23: qty 4

## 2012-02-23 MED ORDER — ONDANSETRON HCL 4 MG/2ML IJ SOLN: 4.0000 mg | Freq: Four times a day (QID) | INTRAMUSCULAR | Status: DC | PRN

## 2012-02-23 MED ORDER — SODIUM CHLORIDE 0.9 % IV SOLN: 250.0000 mL | INTRAVENOUS | Status: DC | PRN

## 2012-02-23 MED ORDER — SODIUM CHLORIDE 0.9 % IV SOLN
INTRAVENOUS | Status: DC
  Administered 2012-02-23: 09:00:00 via INTRAVENOUS

## 2012-02-23 MED ORDER — MIDAZOLAM HCL 2 MG/2ML IJ SOLN
INTRAMUSCULAR | Status: AC
  Filled 2012-02-23: qty 2

## 2012-02-23 MED ORDER — CLOPIDOGREL BISULFATE 300 MG PO TABS
ORAL_TABLET | ORAL | Status: AC
  Filled 2012-02-23: qty 2

## 2012-02-23 MED ORDER — SIMVASTATIN 20 MG PO TABS
20.0000 mg | ORAL_TABLET | Freq: Every day | ORAL | Status: DC
  Administered 2012-02-23: 23:00:00 20 mg via ORAL
  Filled 2012-02-23 (×2): qty 1

## 2012-02-23 MED ORDER — HEPARIN (PORCINE) IN NACL 2-0.9 UNIT/ML-% IJ SOLN
INTRAMUSCULAR | Status: AC
  Filled 2012-02-23: qty 1000

## 2012-02-23 MED ORDER — DIAZEPAM 5 MG PO TABS
ORAL_TABLET | ORAL | Status: AC
  Filled 2012-02-23: qty 1

## 2012-02-23 MED ORDER — ATENOLOL 50 MG PO TABS
50.0000 mg | ORAL_TABLET | Freq: Every day | ORAL | Status: DC
  Administered 2012-02-23: 23:00:00 50 mg via ORAL
  Filled 2012-02-23 (×2): qty 1

## 2012-02-23 MED ORDER — RENA-VITE PO TABS
1.0000 | ORAL_TABLET | Freq: Every day | ORAL | Status: DC
  Administered 2012-02-24: 1 via ORAL
  Filled 2012-02-23 (×2): qty 1

## 2012-02-23 MED ORDER — SODIUM CHLORIDE 0.9 % IJ SOLN: 3.0000 mL | Freq: Two times a day (BID) | INTRAMUSCULAR | Status: DC

## 2012-02-23 MED ORDER — DIAZEPAM 2 MG PO TABS
2.0000 mg | ORAL_TABLET | ORAL | Status: AC
  Administered 2012-02-23: 2 mg via ORAL
  Filled 2012-02-23: qty 1

## 2012-02-23 MED ORDER — BIVALIRUDIN 250 MG IV SOLR
INTRAVENOUS | Status: AC
  Filled 2012-02-23: qty 250

## 2012-02-23 MED ORDER — LIDOCAINE HCL (PF) 1 % IJ SOLN
INTRAMUSCULAR | Status: AC
  Filled 2012-02-23: qty 30

## 2012-02-23 MED ORDER — INSULIN ASPART 100 UNIT/ML ~~LOC~~ SOLN: 6.0000 [IU] | Freq: Three times a day (TID) | SUBCUTANEOUS | Status: DC

## 2012-02-23 MED ORDER — AMLODIPINE BESYLATE 10 MG PO TABS
10.0000 mg | ORAL_TABLET | Freq: Every day | ORAL | Status: DC
  Administered 2012-02-23: 14:00:00 10 mg via ORAL
  Filled 2012-02-23 (×2): qty 1

## 2012-02-23 MED ORDER — CLOPIDOGREL BISULFATE 75 MG PO TABS
75.0000 mg | ORAL_TABLET | Freq: Every day | ORAL | Status: DC
  Administered 2012-02-24: 09:00:00 75 mg via ORAL
  Filled 2012-02-23: qty 1

## 2012-02-23 MED ORDER — INSULIN ASPART 100 UNIT/ML ~~LOC~~ SOLN: 0.0000 [IU] | Freq: Three times a day (TID) | SUBCUTANEOUS | Status: DC

## 2012-02-23 MED ORDER — FENTANYL CITRATE 0.05 MG/ML IJ SOLN
INTRAMUSCULAR | Status: AC
  Filled 2012-02-23: qty 2

## 2012-02-23 MED ORDER — SODIUM CHLORIDE 0.9 % IV SOLN: INTRAVENOUS | Status: AC

## 2012-02-23 MED ORDER — SODIUM CHLORIDE 0.9 % IJ SOLN: 3.0000 mL | INTRAMUSCULAR | Status: DC | PRN

## 2012-02-23 MED ORDER — SEVELAMER CARBONATE 800 MG PO TABS
1600.0000 mg | ORAL_TABLET | Freq: Three times a day (TID) | ORAL | Status: DC
  Administered 2012-02-24: 1600 mg via ORAL
  Filled 2012-02-23 (×6): qty 2

## 2012-02-23 MED ORDER — ASPIRIN 81 MG PO CHEW
81.0000 mg | CHEWABLE_TABLET | Freq: Every day | ORAL | Status: DC
  Administered 2012-02-24: 09:00:00 81 mg via ORAL
  Filled 2012-02-23 (×2): qty 1

## 2012-02-23 NOTE — Progress Notes (Signed)
Utilization Review Completed Aivy Akter J. Tobenna Needs, RN, BSN, NCM [phone]  

## 2012-02-23 NOTE — H&P (Signed)
History and Physical   Patient ID: Lori Greer MRN: [id number], DOB/AGE: October 16, 1970 42 y.o. Date of Encounter: 02/23/2012  Primary Physician: Rogelia Boga, MD Primary Cardiologist: [facility]  Chief Complaint:  Abnormal Myoview  HPI:  42 year old female initially seen in Nov 2013 for evaluation of chest pain. Patient has chronic renal failure and recently started dialysis. She is scheduled to have peritoneal dialysis catheter placement and we were asked to evaluate prior to that as well. Echocardiogram in November of 2013 showed normal LV function, moderate left ventricular hypertrophy, mild biatrial enlargement and trace tricuspid regurgitation. Carotid Dopplers in November of 2013 were normal. Outside records were reviewed and a nuclear study in August of 2013 from [place] showed mild to moderate anterior ischemia. Ejection fraction was 47%. Since she was last seen, she has mild dyspnea on exertion but no exertional chest pain. Occasional mild pedal edema. She is currently dialyzing with a Permcath placed 01/25/2012 and dialyses on Tu/Th/Sat. She is compliant with dietary and fluid restrictions.   Past Medical History  Diagnosis Date  . Bell's palsy 05/12/2008  . DIABETES MELLITUS, TYPE II 03/16/2008  . HYPERTENSION 01/06/2010  . OBESITY 03/16/2008  . Proliferative diabetic retinopathy(362.02) 04/23/2009  . STASIS DERMATITIS 07/06/2009  . Fibroid, uterine   . Murmur   . Chronic renal failure   . Anemia      Surgical History:  Past Surgical History  Procedure Date  . Appendectomy 2012  . Uterine fibroid surgery   . Abdominal hysterectomy   . Abcess drainage 2002    Right axilla  . Insertion of dialysis catheter January 2014    Right IJ      I have reviewed the patient's current medications. Prior to Admission medications   Medication Sig Start Date End Date Taking? Authorizing Provider  ACCU-CHEK FASTCLIX LANCETS MISC 1 each by Does not apply route daily. 01/11/12  Yes  Gordy Savers, MD  amLODipine (NORVASC) 10 MG tablet Take 10 mg by mouth daily.   Yes Historical Provider, MD  atenolol (TENORMIN) 50 MG tablet Take 50 mg by mouth at bedtime.   Yes Historical Provider, MD  folic acid-vitamin b complex-vitamin c-selenium-zinc (DIALYVITE) 3 MG TABS Take 1 tablet by mouth daily.   Yes Historical Provider, MD  glucose blood (ACCU-CHEK ACTIVE STRIPS) test strip Use as instructed 01/11/12  Yes Gordy Savers, MD  insulin aspart (NOVOLOG) 100 UNIT/ML injection Inject 6 Units into the skin 3 (three) times daily before meals.   Yes Historical Provider, MD  pravastatin (PRAVACHOL) 40 MG tablet Take 40 mg by mouth every evening.   Yes Historical Provider, MD  sevelamer (RENVELA) 800 MG tablet Take 1,600 mg by mouth 3 (three) times daily with meals.   Yes Historical Provider, MD  nitroGLYCERIN (NITRODUR - DOSED IN MG/24 HR) 0.4 mg/hr Place 1 patch onto the skin daily as needed. For chest pain    Historical Provider, MD    Scheduled Meds:    . sodium chloride  3 mL Intravenous Q12H   Continuous Infusions:    . sodium chloride 20 mL/hr at 02/23/12 0850   PRN Meds:.sodium chloride, sodium chloride  Allergies: No Known Allergies  History   Social History  . Marital Status: Married    Spouse Name: N/A    Number of Children: 5  . Years of Education: N/A   Occupational History  . CNA    Social History Main Topics  . Smoking status: Former Games developer  . Smokeless tobacco: Never  Used  . Alcohol Use: Yes     Comment: rarely  . Drug Use: No  . Sexually Active: Not on file   Other Topics Concern  . Not on file   Social History Narrative   5 Children    Family History  Problem Relation Age of Onset  . Heart disease Mother     MI at age 82  . Diabetes Father   . Kidney failure Father   . Diabetes Sister     1 sister with diabtes  . Diabetes Brother    Family Status  Relation Status Death Age  . Mother Alive   . Father Deceased   . Sister  Alive   . Brother Alive   . Daughter Alive   . Son Alive     Review of Systems:   Full 14-point review of systems otherwise negative except as noted above.   Physical Exam: Blood pressure 140/76, pulse 62, temperature 97 F (36.1 C), temperature source Oral, resp. rate 16, height 5\' 2"  (1.575 m), weight 160 lb (72.576 kg), SpO2 100.00%. General: Well developed, well nourished, female in no acute distress. Head: Normocephalic, atraumatic, sclera non-icteric, no xanthomas, nares are without discharge. Dentition: good  Neck:No carotid bruits. JVD not elevated. No thyromegally Lungs: Good expansion bilaterally. without wheezes or rhonchi. CTA bilaterally Heart: Regular rate and rhythm with S1 S2.  No S3 or S4.  2/6 murmur LUSB, no rubs, or gallops appreciated. Abdomen: Soft, non-tender, non-distended with normoactive bowel sounds. No hepatomegaly. No rebound/guarding. No obvious abdominal masses. Msk:  Strength and tone appear normal for age. No joint deformities or effusions, no spine or costo-vertebral angle tenderness. Extremities: No clubbing or cyanosis. No edema.  Distal pedal pulses are 2+ in 4 extrem Neuro: Alert and oriented X 3. Moves all extremities spontaneously. No focal deficits noted. Psych:  Responds to questions appropriately with a normal affect. Skin: No rashes or lesions noted  Labs:   Lab Results  Component Value Date   WBC 6.2 02/23/2012   HGB 11.0* 02/23/2012   HCT 33.3* 02/23/2012   MCV 89.8 02/23/2012   PLT 223 02/23/2012    Basename 02/23/12 0859  INR 0.86     Lab 02/23/12 0859 02/19/12 0902  NA 140 --  K 3.1* --  CL 101 --  CO2 33* --  BUN 19 --  CREATININE 3.92* --  CALCIUM 8.6 --  PROT -- 6.5  BILITOT -- 0.4  ALKPHOS -- 57  ALT -- 12  AST -- 17  GLUCOSE 98 --    Lab Results  Component Value Date   CHOL 210* 02/19/2012   HDL 60.90 02/19/2012   TRIG 66.0 02/19/2012    Radiology/Studies: None today  Echo: 12/05/2011 Conclusions - Left  ventricle: The cavity size was normal. Wall thickness was increased in a pattern of moderate LVH. The estimated ejection fraction was 60%. Wall motion was normal; there were no regional wall motion abnormalities. - Mitral valve: Flat closure of mitral valve, but no prolapse. No significant regurgitation. - Left atrium: The atrium was mildly dilated. - Right atrium: The atrium was mildly dilated. - Tricuspid valve: Trivial regurgitation. - Pulmonary arteries: PA peak pressure: 31mm Hg (S). - Pericardium, extracardiac: A trivial pericardial effusion was identified posterior to the heart.  ECG: 23-Feb-2012 09:24:10 [facility] ROUTINE RECORD Sinus bradycardia Otherwise normal ECG 86mm/s 52mm/mV 100Hz  8.0.1 12SL 241 HD CID: 1 Referred by: Unconfirmed Vent. rate 57 BPM PR interval 160 ms QRS  duration 90 ms QT/QTc 460/447 ms P-R-T axes 12 55 72  ASSESSMENT AND PLAN:  Principal Problem:  *Abnormal nuclear stress test Active Problems:  DIABETES MELLITUS, TYPE II  HYPERTENSION  Chronic kidney disease  Ms Bradfield is for cath today, with further evaluation and treatment depending on the results. She will be continued on all her home meds.  Signed,  Bjorn Loser Barrett PA-C 02/23/2012, 10:05 AM Patient seen and examined and history reviewed. Agree with above findings and plan. 42 yo BF with ESRD currently on hemodialysis. She has had chest pain with abnormal nuclear stress test as noted. Risk factors of DM, HTN, ESRD. Will proceed with cardiac cath today via femoral approach. The procedure and risks were reviewed including but not limited to death, myocardial infarction, stroke, arrythmias, bleeding, transfusion, emergency surgery, dye allergy, or renal dysfunction. The patient voices understanding and is agreeable to proceed.   Theron [facility] 02/23/2012 10:17 AM

## 2012-02-23 NOTE — Interval H&P Note (Signed)
History and Physical Interval Note:  02/23/2012 10:19 AM  Lori Greer  has presented today for surgery, with the diagnosis of abnormal nuc  The various methods of treatment have been discussed with the patient and family. After consideration of risks, benefits and other options for treatment, the patient has consented to  Procedure(s) (LRB) with comments: LEFT HEART CATHETERIZATION WITH CORONARY ANGIOGRAM (N/A) as a surgical intervention .  The patient's history has been reviewed, patient examined, no change in status, stable for surgery.  I have reviewed the patient's chart and labs.  Questions were answered to the patient's satisfaction.     Theron Arista [facility] 02/23/2012 10:19 AM

## 2012-02-23 NOTE — CV Procedure (Signed)
  Cardiac Catheterization Procedure Note  Name: Lori Greer MRN: [id number] DOB: 09-23-1970  Procedure:  Selective Coronary Angiography,   PTCA/Stent of mid-distal LAD  Indication: 42 year old white female with history of end-stage renal disease, hypertension, and diabetes. She has class II angina on 2 antianginal agents. Myoview study demonstrates evidence of anterior wall ischemia.  Diagnostic Procedure Details: The right groin was prepped, draped, and anesthetized with 1% lidocaine. Using the modified Seldinger technique, a 5 French sheath was introduced into the right femoral artery. Standard Judkins catheters were used for selective coronary angiography and left ventriculography. Catheter exchanges were performed over a wire.  The diagnostic procedure was well-tolerated without immediate complications.  PROCEDURAL FINDINGS Hemodynamics: AO 175/73 with a mean of 115 mmHg LV N/A  Coronary angiography: Coronary dominance: right  Left mainstem: Normal.  Left anterior descending (LAD): There is a focal 90% stenosis in the mid to distal LAD.  There is a large ramus intermediate branch that trifurcates on the lateral wall. There is a 30% stenosis in the more lateral branch. Otherwise scattered nonobstructive disease.  Left circumflex (LCx): The left circumflex is a small branch without significant disease.  Right coronary artery (RCA): The right coronary is a dominant vessel. The proximal, mid, distal RCA are normal. There is a 95% stenosis in the posterior lateral branch. This is a small branch less than 1.5 mm in diameter.  Left ventriculography: Not performed  PCI Procedure Note:  Following the diagnostic procedure, the decision was made to proceed with PCI of the LAD. The PL OM branch was felt to be too small for intervention. The LAD lesion correlated well with her nuclear stress test. The sheath was upsized to a 6 [place]. Weight-based bivalirudin was given for anticoagulation.  Plavix 600 mg was given orally. Once a therapeutic ACT was achieved, a 6 [place] XB LAD 3.5 guide catheter was inserted.  A pro-water coronary guidewire was used to cross the lesion.  The lesion was predilated with a 2.0 mm balloon.  The lesion was then stented with a 2.5 x 12 mm Promus stent.  The stent was postdilated with a 2.5 mm noncompliant balloon.  Following PCI, there was 0% residual stenosis and TIMI-3 flow. Final angiography confirmed an excellent result. Femoral hemostasis was achieved with manual compression.  The patient tolerated the PCI procedure well. There were no immediate procedural complications.  The patient was transferred to the post catheterization recovery area for further monitoring.  PCI Data: Vessel - LAD/Segment - mid to distal Percent Stenosis (pre)  90% TIMI-flow 3 Stent 2.5 x 12 mm Promus Percent Stenosis (post) 0% TIMI-flow (post) 3  Final Conclusions:   1. 2 vessel obstructive coronary disease. 2. Successful stenting of the mid to distal LAD with a drug-eluting stent.  Recommendations: Continue medical therapy for the posterior lateral disease. Continue dual antiplatelet therapy for one year.  Peter [facility] 02/23/2012, 11:13 AM

## 2012-02-23 NOTE — Progress Notes (Signed)
Site area: right groin  Site Prior to Removal:  Level 0  Pressure Applied For 25 MINUTES    Minutes Beginning at1600  Manual:   yes  Patient Status During Pull:  stable  Post Pull Groin Site:  Level 0  Post Pull Instructions Given:  yes  Post Pull Pulses Present:  yes  Dressing Applied:  yes  Comments:  Gauze secured with medipore tape, pressure dressing applied

## 2012-02-24 ENCOUNTER — Encounter (HOSPITAL_COMMUNITY): Payer: Self-pay | Admitting: Cardiology

## 2012-02-24 DIAGNOSIS — I2510 Atherosclerotic heart disease of native coronary artery without angina pectoris: Secondary | ICD-10-CM

## 2012-02-24 DIAGNOSIS — R9430 Abnormal result of cardiovascular function study, unspecified: Secondary | ICD-10-CM

## 2012-02-24 LAB — GLUCOSE, CAPILLARY: Glucose-Capillary: 98 mg/dL (ref 70–99)

## 2012-02-24 MED ORDER — DOXERCALCIFEROL 4 MCG/2ML IV SOLN
INTRAVENOUS | Status: AC
  Administered 2012-02-24: 4 ug via INTRAVENOUS
  Filled 2012-02-24: qty 2

## 2012-02-24 MED ORDER — ASPIRIN 81 MG PO TBEC: 81.0000 mg | DELAYED_RELEASE_TABLET | Freq: Every day | ORAL | Status: DC

## 2012-02-24 MED ORDER — SODIUM CHLORIDE 0.9 % IV SOLN
25.0000 mg | Freq: Once | INTRAVENOUS | Status: AC
  Administered 2012-02-24: 25 mg via INTRAVENOUS
  Filled 2012-02-24 (×2): qty 0.5

## 2012-02-24 MED ORDER — DOXERCALCIFEROL 4 MCG/2ML IV SOLN
4.0000 ug | INTRAVENOUS | Status: DC
  Administered 2012-02-24: 4 ug via INTRAVENOUS
  Filled 2012-02-24: qty 2

## 2012-02-24 MED ORDER — CLOPIDOGREL BISULFATE 75 MG PO TABS: 75.0000 mg | ORAL_TABLET | Freq: Every day | ORAL | Status: DC

## 2012-02-24 NOTE — Progress Notes (Signed)
SUBJECTIVE: The patient is doing well today.  At this time, she denies chest pain, shortness of breath, or any new concerns.     [name] amLODipine  10 mg Oral Daily  . aspirin  81 mg Oral Daily  . atenolol  50 mg Oral QHS  . clopidogrel  75 mg Oral Q breakfast  . insulin aspart  0-15 Units Subcutaneous TID WC  . insulin aspart  6 Units Subcutaneous TID AC  . multivitamin  1 tablet Oral Daily  . sevelamer carbonate  1,600 mg Oral TID WC  . simvastatin  20 mg Oral q1800      OBJECTIVE: Physical Exam: Filed Vitals:   02/23/12 2245 02/24/12 0000 02/24/12 0500 02/24/12 0749  BP: 155/69 137/67 163/75 157/97  Pulse: 82 72 70 68  Temp:  98 F (36.7 C) 97.8 F (36.6 C) 98.1 F (36.7 C)  TempSrc:  Oral Oral Oral  Resp:  24 22 22   Height:      Weight:   147 lb 4.3 oz (66.8 kg)   SpO2:  99% 99% 99%    Intake/Output Summary (Last 24 hours) at 02/24/12 0802 Last data filed at 02/23/12 2000  Gross per 24 hour  Intake    480 ml  Output    150 ml  Net    330 ml    Telemetry reveals sinus rhythm  GEN- The patient is well appearing, alert and oriented x 3 today.   Head- normocephalic, atraumatic Eyes-  Sclera clear, conjunctiva pink Ears- hearing intact Oropharynx- clear Neck- supple, no JVP Lymph- no cervical lymphadenopathy Lungs- Clear to ausculation bilaterally, normal work of breathing Heart- Regular rate and rhythm, no murmurs, rubs or gallops, PMI not laterally displaced GI- soft, NT, ND, + BS Extremities- no clubbing, cyanosis, or edema, groin without hematoma or bruits Neuro- strength and sensation are intact  LABS: Basic Metabolic Panel:  Basename 02/23/12 0859  NA 140  K 3.1*  CL 101  CO2 33*  GLUCOSE 98  BUN 19  CREATININE 3.92*  CALCIUM 8.6  MG --  PHOS --   Liver Function Tests: No results found for this basename: AST:2,ALT:2,ALKPHOS:2,BILITOT:2,PROT:2,ALBUMIN:2 in the last 72 hours No results found for this basename: LIPASE:2,AMYLASE:2 in the last  72 hours CBC:  Basename 02/23/12 0859  WBC 6.2  NEUTROABS --  HGB 11.0*  HCT 33.3*  MCV 89.8  PLT 223   Cardiac Enzymes: No results found for this basename: CKTOTAL:3,CKMB:3,CKMBINDEX:3,TROPONINI:3 in the last 72 hours BNP: No components found with this basename: POCBNP:3 D-Dimer: No results found for this basename: DDIMER:2 in the last 72 hours Hemoglobin A1C: No results found for this basename: HGBA1C in the last 72 hours Fasting Lipid Panel: No results found for this basename: CHOL,HDL,LDLCALC,TRIG,CHOLHDL,LDLDIRECT in the last 72 hours Thyroid Function Tests: No results found for this basename: TSH,T4TOTAL,FREET3,T3FREE,THYROIDAB in the last 72 hours Anemia Panel: No results found for this basename: VITAMINB12,FOLATE,FERRITIN,TIBC,IRON,RETICCTPCT in the last 72 hours  RADIOLOGY: Ir Fluoro Guide Cv Line Right  01/30/2012  *RADIOLOGY REPORT*  IR PLACEMENT OF A TUNNELED HEMODIALYSIS CATHETER USING ULTRASOUND AND FLUOROSCOPIC GUIDANCE  Date: 01/30/2012  Clinical History: 57 old female with worsening chronic renal disease.  She requires dialysis but intends to use peritoneal dialysis initially.  A tunneled hemodialysis catheter is requested to bridge until she can begin peritoneal dialysis.  Procedures Performed: 1. Ultrasound-guided puncture of the right internal jugular vein 2.  Placement of a tunneled hemodialysis catheter under fluoroscopic guidance  Interventional Radiologist:  Sterling Big, MD  Sedation: Moderate (conscious) sedation was used.  Two mg Versed, 50 mcg Fentanyl were administered intravenously.  The patient's vital signs were monitored continuously by radiology nursing throughout the procedure.  Sedation Time: 21 minutes  Fluoroscopy time: 1.1 minutes  Contrast volume: None  PROCEDURE/FINDINGS:   Informed consent was obtained from the patient following explanation of the procedure, risks, benefits and alternatives. The patient understands, agrees and consents for  the procedure. All questions were addressed. A time out was performed.  Maximal barrier sterile technique utilized including caps, mask, sterile gowns, sterile gloves, large sterile drape, hand hygiene, and chlorhexidine skin prep.  The right neck was interrogated with ultrasound.  The right internal jugular vein is widely patent without evidence of thrombosis.  Local anesthesia was achieved by infiltration of 1% lidocaine in the subcutaneous tissues.  A small dermatotomy was made with a #11 blade.  Under direct sonographic guidance, the right internal jugular vein was punctured with a 21-gauge micropuncture needle.  An image was obtained stored for the electronic medical record.  A micro wire was advanced into the right heart and a 5-French transitional micro sheath advanced in the superior vena cava.  A micro wire was used to measure the intravascular volume to the upper right atrium.  A 19 cm tip to cuff HemoSplit hemodialysis catheter was selected. An appropriate skin exit site was chosen inferior to the clavicle. Local anesthesia was obtained by infiltration of 1% lidocaine.  A small dermatotomy was made at the skin exit site.  The hemodialysis catheter was then tunneled from the skin exit site through the subcutaneous tissues over the clavicle and to the dermatotomy overlying the venous access site.  The venous access site was then serially dilated and a peel-away sheath placed.  Hemodialysis catheter was then advanced through the peel-away sheath and positioned with the tip in the upper right atrium under fluoroscopic guidance.  The catheter flushed and aspirated with ease.  It was then locked with 1000 unit/ml heparinized saline.  The dermatotomy was closed with a single inverted interrupted 4-0 Vicryl suture.  A small amount of Gelfoam was a segmented and placed within the tract to facilitate hemostasis.  The catheter was then secured to the chest with O Prolene suture.  The patient tolerated the procedure  well, there is no immediate complication.  IMPRESSION:  1.  Successful placement of a right IJ approach 19 cm tip to cuff HemoSplit tunneled hemodialysis catheter.  Catheter tip is in the upper right atrium and ready for immediate use. 2.  The retaining suture at the skin exit site can be removed in 14 days.  Signed,  Sterling Big, MD Vascular & Interventional Radiologist [facility]   Original Report Authenticated By: Malachy Moan, M.D.    Ir US Guide Vasc Access Right  01/30/2012  *RADIOLOGY REPORT*  IR PLACEMENT OF A TUNNELED HEMODIALYSIS CATHETER USING ULTRASOUND AND FLUOROSCOPIC GUIDANCE  Date: 01/30/2012  Clinical History: 30 old female with worsening chronic renal disease.  She requires dialysis but intends to use peritoneal dialysis initially.  A tunneled hemodialysis catheter is requested to bridge until she can begin peritoneal dialysis.  Procedures Performed: 1. Ultrasound-guided puncture of the right internal jugular vein 2.  Placement of a tunneled hemodialysis catheter under fluoroscopic guidance  Interventional Radiologist:  Sterling Big, MD  Sedation: Moderate (conscious) sedation was used.  Two mg Versed, 50 mcg Fentanyl were administered intravenously.  The patient's vital signs were monitored continuously by  radiology nursing throughout the procedure.  Sedation Time: 21 minutes  Fluoroscopy time: 1.1 minutes  Contrast volume: None  PROCEDURE/FINDINGS:   Informed consent was obtained from the patient following explanation of the procedure, risks, benefits and alternatives. The patient understands, agrees and consents for the procedure. All questions were addressed. A time out was performed.  Maximal barrier sterile technique utilized including caps, mask, sterile gowns, sterile gloves, large sterile drape, hand hygiene, and chlorhexidine skin prep.  The right neck was interrogated with ultrasound.  The right internal jugular vein is widely patent without evidence of  thrombosis.  Local anesthesia was achieved by infiltration of 1% lidocaine in the subcutaneous tissues.  A small dermatotomy was made with a #11 blade.  Under direct sonographic guidance, the right internal jugular vein was punctured with a 21-gauge micropuncture needle.  An image was obtained stored for the electronic medical record.  A micro wire was advanced into the right heart and a 5-French transitional micro sheath advanced in the superior vena cava.  A micro wire was used to measure the intravascular volume to the upper right atrium.  A 19 cm tip to cuff HemoSplit hemodialysis catheter was selected. An appropriate skin exit site was chosen inferior to the clavicle. Local anesthesia was obtained by infiltration of 1% lidocaine.  A small dermatotomy was made at the skin exit site.  The hemodialysis catheter was then tunneled from the skin exit site through the subcutaneous tissues over the clavicle and to the dermatotomy overlying the venous access site.  The venous access site was then serially dilated and a peel-away sheath placed.  Hemodialysis catheter was then advanced through the peel-away sheath and positioned with the tip in the upper right atrium under fluoroscopic guidance.  The catheter flushed and aspirated with ease.  It was then locked with 1000 unit/ml heparinized saline.  The dermatotomy was closed with a single inverted interrupted 4-0 Vicryl suture.  A small amount of Gelfoam was a segmented and placed within the tract to facilitate hemostasis.  The catheter was then secured to the chest with O Prolene suture.  The patient tolerated the procedure well, there is no immediate complication.  IMPRESSION:  1.  Successful placement of a right IJ approach 19 cm tip to cuff HemoSplit tunneled hemodialysis catheter.  Catheter tip is in the upper right atrium and ready for immediate use. 2.  The retaining suture at the skin exit site can be removed in 14 days.  Signed,  Sterling Big, MD Vascular  & Interventional Radiologist [facility]   Original Report Authenticated By: Malachy Moan, M.D.     ASSESSMENT AND PLAN:  Principal Problem:  *Abnormal nuclear stress test Active Problems:  DIABETES MELLITUS, TYPE II  HYPERTENSION  Chronic kidney disease  1. CAD Doing well s/p cath Continue ASA and Plavix for one year Continue beta blocker and statin  2. HTN Stable No change required today  3. DM Resume home regimen  4. ESRD For dialysis today  DC to home  Hillis Range, MD 02/24/2012 8:02 AM

## 2012-02-24 NOTE — Procedures (Signed)
I was present at this dialysis session. I have reviewed the session itself and made appropriate changes.   42 yo AAF with hx of hx of DM II, obesity and ESRD admitted for heart cath which showed 2 vessel coronary disease; a stent was placed in the LAD. Today she is in good spirits on dialysis without compliants.  Exam shows R IJ HD access, o/w en  [name], MD [employer] 02/24/2012, 1:47 PM

## 2012-02-24 NOTE — Progress Notes (Signed)
[id number]- Pt ambulated with nursing staff. PCI education completed with pt including CP, NTG use and calling 911, activity progress, restrictions, and risk factor modification. Discussed Phase 2 CR and pt is interested. Permission given to send contact info to CR at [facility]. Pt verbalizes understanding of instructions given.  Annetta Maw

## 2012-02-24 NOTE — Discharge Summary (Signed)
CARDIOLOGY DISCHARGE SUMMARY    Patient ID: Lori Greer,  MRN: [id number], DOB/AGE: [birth year] 43 y.o.  Admit date: 02/23/2012 Discharge date: 02/24/2012  Primary Care Physician: Karl Pock, MD   Primary Cardiologist: Peter [place], MD  Primary Discharge Diagnosis:  1. CAD s/p DES to mid-dLAD 02/23/2012  Secondary Discharge Diagnoses:  1. ESRD on HD 2. HTN 3. Anemia 4. DM 5. Obesity 6. Stasis dermatitis  Procedures This Admission:  1. Left cardiac catheterization PROCEDURAL FINDINGS  Hemodynamics:  AO 175/73 with a mean of 115 mmHg  LV N/A  Coronary angiography:  Coronary dominance: right  Left mainstem: Normal.  Left anterior descending (LAD): There is a focal 90% stenosis in the mid to distal LAD.  There is a large ramus intermediate branch that trifurcates on the lateral wall. There is a 30% stenosis in the more lateral branch. Otherwise scattered nonobstructive disease.  Left circumflex (LCx): The left circumflex is a small branch without significant disease.  Right coronary artery (RCA): The right coronary is a dominant vessel. The proximal, mid, distal RCA are normal. There is a 95% stenosis in the posterior lateral branch. This is a small branch less than 1.5 mm in diameter.  Left ventriculography: Not performed  PCI Procedure Note: Following the diagnostic procedure, the decision was made to proceed with PCI of the LAD. The PL OM branch was felt to be too small for intervention. The LAD lesion correlated well with her nuclear stress test. The sheath was upsized to a 6 [place]. Weight-based bivalirudin was given for anticoagulation. Plavix 600 mg was given orally. Once a therapeutic ACT was achieved, a 6 [place] XB LAD 3.5 guide catheter was inserted. A pro-water coronary guidewire was used to cross the lesion. The lesion was predilated with a 2.0 mm balloon. The lesion was then stented with a 2.5 x 12 mm Promus stent. The stent was postdilated with a 2.5 mm  noncompliant balloon. Following PCI, there was 0% residual stenosis and TIMI-3 flow. Final angiography confirmed an excellent result. Femoral hemostasis was achieved with manual compression. The patient tolerated the PCI procedure well. There were no immediate procedural complications. The patient was transferred to the post catheterization recovery area for further monitoring.  PCI Data:  Vessel - LAD/Segment - mid to distal  Percent Stenosis (pre) 90%  TIMI-flow 3  Stent 2.5 x 12 mm Promus  Percent Stenosis (post) 0%  TIMI-flow (post) 3  Final Conclusions:  - 2 vessel obstructive coronary disease.  - Successful stenting of the mid to distal LAD with a drug-eluting stent.  Recommendations:  - Continue medical therapy for the posterior lateral disease. Continue dual antiplatelet therapy for one year.  History and Hospital Course:  Lori Greer is a 43 year old female initially seen in Nov 2013 for evaluation of chest pain. She has chronic renal failure and recently started dialysis. She is scheduled to have peritoneal dialysis catheter placement and we were asked to evaluate prior to that placement. Echocardiogram in November of 2013 showed normal LV function, moderate left ventricular hypertrophy, mild biatrial enlargement and trace tricuspid regurgitation. Outside records were reviewed and a nuclear study in August of 2013 from [place] showed mild to moderate anterior ischemia. Ejection fraction was 47%. Since she was last seen, she had mild dyspnea on exertion but no exertional chest pain and occasional mild pedal edema. She presented yesterday for an elective outpatient cardiac catheterization for definitive evaluation of her coronary anatomy given her symptoms, risk factors and abnormal stress test. Ms.  Greer tolerated this procedure well without any immediate complication. She remains hemodynamically stable and afebrile. Her groin site is intact without significant bleeding or hematoma. She has  been given discharge instructions including wound care and activity restrictions. She will follow-up in clinic in 2 weeks. She has been seen, examined and deemed stable for discharge today by Dr. Hillis Range.  Discharge Vitals: Blood pressure 157/97, pulse 68, temperature 98.1 F (36.7 C), temperature source Oral, resp. rate 22, height 5\' 2"  (1.575 m), weight 147 lb 4.3 oz (66.8 kg), SpO2 99.00%.   Labs: Lab Results  Component Value Date   WBC 6.2 02/23/2012   HGB 11.0* 02/23/2012   HCT 33.3* 02/23/2012   MCV 89.8 02/23/2012   PLT 223 02/23/2012     Lab 02/23/12 0859 02/19/12 0902  NA 140 --  K 3.1* --  CL 101 --  CO2 33* --  BUN 19 --  CREATININE 3.92* --  CALCIUM 8.6 --  PROT -- 6.5  BILITOT -- 0.4  ALKPHOS -- 57  ALT -- 12  AST -- 17  GLUCOSE 98 --   Basename 02/23/12 0859  INR 0.86    Disposition:  The patient is being discharged in stable condition.  Follow-up:     Follow-up Information    Follow up with Lori Fredrickson, NP. On 03/11/2012. (At 9:00 AM for hospital follow-up)    Contact information:   [facility] [address] [place] [address] [phone]       Discharge Medications:    Medication List     As of 02/24/2012  8:57 AM    TAKE these medications         ACCU-CHEK FASTCLIX LANCETS Misc   1 each by Does not apply route daily.      amLODipine 10 MG tablet   Commonly known as: NORVASC   Take 10 mg by mouth daily.      aspirin 81 MG EC tablet   Take 1 tablet (81 mg total) by mouth daily.      atenolol 50 MG tablet   Commonly known as: TENORMIN   Take 50 mg by mouth at bedtime.      clopidogrel 75 MG tablet   Commonly known as: PLAVIX   Take 1 tablet (75 mg total) by mouth daily with breakfast.      folic acid-vitamin b complex-vitamin c-selenium-zinc 3 MG Tabs   Take 1 tablet by mouth daily.      glucose blood test strip   Use as instructed      insulin aspart 100 UNIT/ML injection   Commonly known as:  novoLOG   Inject 6 Units into the skin 3 (three) times daily before meals.      nitroGLYCERIN 0.4 mg/hr   Commonly known as: NITRODUR - Dosed in mg/24 hr   Place 1 patch onto the skin daily as needed. For chest pain      pravastatin 40 MG tablet   Commonly known as: PRAVACHOL   Take 40 mg by mouth every evening.      sevelamer carbonate 800 MG tablet   Commonly known as: RENVELA   Take 1,600 mg by mouth 3 (three) times daily with meals.       Duration of Discharge Encounter: Greater than 30 minutes including physician time.  Limmie Patricia, PA-C 02/24/2012, 8:57 AM   Hillis Range, MD

## 2012-02-26 MED FILL — Dextrose Inj 5%: INTRAVENOUS | Qty: 50 | Status: AC

## 2012-02-28 ENCOUNTER — Telehealth: Payer: Self-pay | Admitting: Cardiology

## 2012-02-28 NOTE — Telephone Encounter (Signed)
Need to be seen in office prior to surgical procedure Lori Greer

## 2012-02-28 NOTE — Telephone Encounter (Signed)
I will forward this to Dr Jens Som for review and recommendations.

## 2012-02-28 NOTE — Telephone Encounter (Signed)
Spoke with Britta Mccreedy at [facility]. Pt needs to have surgery for peritoneal dialysis catheter placement-this is not scheduled yet. I made Britta Mccreedy aware that pt recently had DES placement and has post hospital appt 03/11/12 with Sunday Spillers.  She is going to forward this information to Dr Derrell Lolling.

## 2012-02-28 NOTE — Telephone Encounter (Signed)
New Problem:    Called in needing surgical clearance for the patient.  Will fax request today.  Please call back.[name]

## 2012-02-29 NOTE — Telephone Encounter (Signed)
Pt has appt 03-11-12.

## 2012-03-04 ENCOUNTER — Telehealth (INDEPENDENT_AMBULATORY_CARE_PROVIDER_SITE_OTHER): Payer: Self-pay | Admitting: General Surgery

## 2012-03-04 NOTE — Telephone Encounter (Signed)
Called to ask patient if she could come in sooner for her appt on 2/12 to fill in gaps in the schd...she stated that she could and it wouldn't be a problem...she will be here at 3:30

## 2012-03-05 ENCOUNTER — Telehealth: Payer: Self-pay | Admitting: Internal Medicine

## 2012-03-05 MED ORDER — HYDROCODONE-ACETAMINOPHEN 5-325 MG PO TABS: 1.0000 | ORAL_TABLET | Freq: Four times a day (QID) | ORAL | Status: DC | PRN

## 2012-03-05 NOTE — Telephone Encounter (Signed)
Vicodin 5-500 last filled 02-06-12, #50 with 0 refills.  This will need to change to 5-325 when called into pharmacy.  Pt last OV was Nov. 2013

## 2012-03-05 NOTE — Telephone Encounter (Signed)
Okay Vicodin refilled #50

## 2012-03-05 NOTE — Telephone Encounter (Signed)
Patient called stating that she need a refill of her pain meds patient was unable to identify what she takes. Please assist.

## 2012-03-06 ENCOUNTER — Encounter (INDEPENDENT_AMBULATORY_CARE_PROVIDER_SITE_OTHER): Payer: BC Managed Care – PPO | Admitting: General Surgery

## 2012-03-06 ENCOUNTER — Telehealth (INDEPENDENT_AMBULATORY_CARE_PROVIDER_SITE_OTHER): Payer: Self-pay | Admitting: General Surgery

## 2012-03-06 NOTE — Telephone Encounter (Signed)
LMOM to confirm or deny today's appt due to weather...ask patient to return my call to let me know if she would still be coming or would like to R/s it

## 2012-03-11 ENCOUNTER — Ambulatory Visit (INDEPENDENT_AMBULATORY_CARE_PROVIDER_SITE_OTHER): Payer: Medicaid Other | Admitting: General Surgery

## 2012-03-11 ENCOUNTER — Encounter (INDEPENDENT_AMBULATORY_CARE_PROVIDER_SITE_OTHER): Payer: Self-pay | Admitting: General Surgery

## 2012-03-11 ENCOUNTER — Other Ambulatory Visit: Payer: Self-pay | Admitting: *Deleted

## 2012-03-11 ENCOUNTER — Other Ambulatory Visit (INDEPENDENT_AMBULATORY_CARE_PROVIDER_SITE_OTHER): Payer: Self-pay | Admitting: General Surgery

## 2012-03-11 ENCOUNTER — Telehealth (INDEPENDENT_AMBULATORY_CARE_PROVIDER_SITE_OTHER): Payer: Self-pay | Admitting: *Deleted

## 2012-03-11 ENCOUNTER — Ambulatory Visit (INDEPENDENT_AMBULATORY_CARE_PROVIDER_SITE_OTHER): Payer: Medicaid Other | Admitting: Nurse Practitioner

## 2012-03-11 ENCOUNTER — Encounter: Payer: Self-pay | Admitting: Nurse Practitioner

## 2012-03-11 VITALS — BP 156/74 | HR 64 | Ht 62.0 in | Wt 164.8 lb

## 2012-03-11 VITALS — BP 134/86 | HR 64 | Temp 97.9°F | Resp 16 | Ht 62.0 in | Wt 165.6 lb

## 2012-03-11 DIAGNOSIS — Z9889 Other specified postprocedural states: Secondary | ICD-10-CM

## 2012-03-11 DIAGNOSIS — N186 End stage renal disease: Secondary | ICD-10-CM

## 2012-03-11 DIAGNOSIS — N185 Chronic kidney disease, stage 5: Secondary | ICD-10-CM

## 2012-03-11 DIAGNOSIS — N189 Chronic kidney disease, unspecified: Secondary | ICD-10-CM

## 2012-03-11 DIAGNOSIS — I259 Chronic ischemic heart disease, unspecified: Secondary | ICD-10-CM

## 2012-03-11 LAB — CBC WITH DIFFERENTIAL/PLATELET
Basophils Absolute: 0 10*3/uL (ref 0.0–0.1)
Basophils Relative: 0.4 % (ref 0.0–3.0)
Eosinophils Absolute: 0.2 10*3/uL (ref 0.0–0.7)
Eosinophils Relative: 3.8 % (ref 0.0–5.0)
HCT: 32.7 % — ABNORMAL LOW (ref 36.0–46.0)
Hemoglobin: 10.6 g/dL — ABNORMAL LOW (ref 12.0–15.0)
Lymphocytes Relative: 33.9 % (ref 12.0–46.0)
Lymphs Abs: 2.2 10*3/uL (ref 0.7–4.0)
MCHC: 32.4 g/dL (ref 30.0–36.0)
MCV: 90.8 fl (ref 78.0–100.0)
Monocytes Absolute: 0.5 10*3/uL (ref 0.1–1.0)
Monocytes Relative: 8.2 % (ref 3.0–12.0)
Neutro Abs: 3.5 10*3/uL (ref 1.4–7.7)
Neutrophils Relative %: 53.7 % (ref 43.0–77.0)
Platelets: 225 10*3/uL (ref 150.0–400.0)
RBC: 3.6 Mil/uL — ABNORMAL LOW (ref 3.87–5.11)
RDW: 16.2 % — ABNORMAL HIGH (ref 11.5–14.6)
WBC: 6.6 10*3/uL (ref 4.5–10.5)

## 2012-03-11 LAB — BASIC METABOLIC PANEL
BUN: 45 mg/dL — ABNORMAL HIGH (ref 6–23)
CO2: 34 mEq/L — ABNORMAL HIGH (ref 19–32)
Calcium: 9.2 mg/dL (ref 8.4–10.5)
Chloride: 95 mEq/L — ABNORMAL LOW (ref 96–112)
Creatinine, Ser: 6 mg/dL (ref 0.4–1.2)
GFR: 9.82 mL/min — CL (ref >60.00)
Glucose, Bld: 208 mg/dL — ABNORMAL HIGH (ref 70–99)
Potassium: 3.7 mEq/L (ref 3.5–5.1)
Sodium: 136 mEq/L (ref 135–145)

## 2012-03-11 NOTE — Patient Instructions (Addendum)
Walking every day is encouraged - goal is 45 minutes per day  Ok to start cardiac rehab - I have sent them a note.  We are checking labs today  I will send Dr. Derrell Lolling a note today  We will see you in March as planned.  Call the [facility] office at [phone] if you have any questions, problems or concerns.

## 2012-03-11 NOTE — Telephone Encounter (Signed)
Dina with Dr. Bosie Helper office (Vascular/Vein Specialist) called to state she has scheduled patient for the hemodialysis access evaluation for tomorrow at 330p.  She will call to let the patient know.

## 2012-03-11 NOTE — Progress Notes (Signed)
Patient ID: Lori Greer, female   DOB: 07/04/1970, 42 y.o.   MRN: [id number] The patient is a 42 year old female who is following up for her for peritoneal dialysis catheter. In the interim the patient has undergone cardiac catheter and stent placement, and then placed on aspirin and Plavix for one year. Patient has also undergone hemodialysis via temporary catheter. I discussed with the patient she would like to pursue vascular access for hemodialysis and abandon the peritoneal dialysis at this time.  Assessment and plan: 42 year old female with end-stage renal disease who needs hemodialysis. After engaging hemodialysis via a temporary catheter the patient wishes to pursue permanent vascular access for hemodialysis, as opposed to peritoneal dialysis.  The patient referred to vascular surgeon to discuss surgery in regards to vascular access.

## 2012-03-11 NOTE — Progress Notes (Signed)
Lori Greer Date of Birth: July 16, 1970 Medical Record #[id number]  History of Present Illness: [name] is seen back today for a post hospital visit. She is seen for Dr. Jens Som. She has CAD and has had recent DES to the mid LAD and a 95% PL stenosis which will be managed medically back at the end of January. She had had a prior abnormal stress test in [place] back in August. She is committed to dual antiplatelet therapy for one year. Her other issues include ESRD now on HD, obesity, HTN, anemia, DM, obesity and stasis dermatitis.   She is here today alone. She is doing ok. She is considering having a peritoneal dialysis catheter placement by General Surgery (Dr. Derrell Lolling). Sees him today. She is not having chest pain, but never did. Her energy level has improved. Sugars are ok. No problems with her groin. Tolerating her medicines. BP usually falls with dialysis. Not short of breath. Not dizzy or lightheaded and no passing out spells. Overall she is pleased with how she feels.    Current Outpatient Prescriptions on File Prior to Visit  Medication Sig Dispense Refill  . ACCU-CHEK FASTCLIX LANCETS MISC 1 each by Does not apply route daily.  100 each  3  . amLODipine (NORVASC) 10 MG tablet Take 10 mg by mouth daily.      [name] aspirin EC 81 MG EC tablet Take 1 tablet (81 mg total) by mouth daily.      [name] atenolol (TENORMIN) 50 MG tablet Take 50 mg by mouth at bedtime.      . clopidogrel (PLAVIX) 75 MG tablet Take 1 tablet (75 mg total) by mouth daily with breakfast.  30 tablet  11  . folic acid-vitamin b complex-vitamin c-selenium-zinc (DIALYVITE) 3 MG TABS Take 1 tablet by mouth daily.      [name] glucose blood (ACCU-CHEK ACTIVE STRIPS) test strip Use as instructed  100 each  12  . HYDROcodone-acetaminophen (NORCO/VICODIN) 5-325 MG per tablet Take 1 tablet by mouth every 6 (six) hours as needed for pain.  50 tablet  0  . insulin aspart (NOVOLOG) 100 UNIT/ML injection Inject 6 Units into the skin as  needed.       . nitroGLYCERIN (NITRODUR - DOSED IN MG/24 HR) 0.4 mg/hr Place 1 patch onto the skin daily as needed. For chest pain      . pravastatin (PRAVACHOL) 40 MG tablet Take 40 mg by mouth every evening.      . sevelamer (RENVELA) 800 MG tablet Take 1,600 mg by mouth 3 (three) times daily with meals.       No current facility-administered medications on file prior to visit.    No Known Allergies  Past Medical History  Diagnosis Date  . Bell's palsy 05/12/2008  . DIABETES MELLITUS, TYPE II 03/16/2008  . HYPERTENSION 01/06/2010  . OBESITY 03/16/2008  . Proliferative diabetic retinopathy(362.02) 04/23/2009  . STASIS DERMATITIS 07/06/2009  . Fibroid, uterine   . Murmur   . Chronic renal failure     ESRD on HD  . Anemia   . CAD (coronary artery disease) 01/2012    cardiac cath 02/23/2012 - 2 vessel CAD s/p DES to mid-LAD     Past Surgical History  Procedure Laterality Date  . Appendectomy  2012  . Uterine fibroid surgery    . Abdominal hysterectomy    . Abcess drainage  2002    Right axilla  . Insertion of dialysis catheter  January 2014    Right  IJ     History  Smoking status  . Former Smoker  Smokeless tobacco  . Never Used    History  Alcohol Use  . Yes    Comment: rarely    Family History  Problem Relation Age of Onset  . Heart disease Mother     MI at age 46  . Diabetes Father   . Kidney failure Father   . Diabetes Sister     1 sister with diabtes  . Diabetes Brother     Review of Systems: The review of systems is per the HPI.  All other systems were reviewed and are negative.  Physical Exam: BP 156/74  Pulse 64  Ht 5\' 2"  (1.575 m)  Wt 164 lb 12.8 oz (74.753 kg)  BMI 30.13 kg/m2 Patient is very pleasant and in no acute distress. She is obese. Skin is warm and dry. Color is normal.  HEENT is unremarkable. Normocephalic/atraumatic. PERRL. Sclera are nonicteric. Neck is supple. No masses. No JVD. Lungs are clear. Cardiac exam shows a regular rate and  rhythm. Soft outflow murmur noted. Abdomen is soft. Extremities are without edema. Gait and ROM are intact. No gross neurologic deficits noted.  LABORATORY DATA:  Lab Results  Component Value Date   WBC 6.2 02/23/2012   HGB 11.0* 02/23/2012   HCT 33.3* 02/23/2012   PLT 223 02/23/2012   GLUCOSE 98 02/23/2012   CHOL 210* 02/19/2012   TRIG 66.0 02/19/2012   HDL 60.90 02/19/2012   LDLDIRECT 119.1 02/19/2012   ALT 12 02/19/2012   AST 17 02/19/2012   NA 140 02/23/2012   K 3.1* 02/23/2012   CL 101 02/23/2012   CREATININE 3.92* 02/23/2012   BUN 19 02/23/2012   CO2 33* 02/23/2012   INR 0.86 02/23/2012   HGBA1C  Value: 7.9 (NOTE)                                                                       According to the ADA Clinical Practice Recommendations for 2011, when HbA1c is used as a screening test:   >=6.5%   Diagnostic of Diabetes Mellitus           (if abnormal result  is confirmed)  5.7-6.4%   Increased risk of developing Diabetes Mellitus  References:Diagnosis and Classification of Diabetes Mellitus,Diabetes Care,2011,34(Suppl 1):S62-S69 and Standards of Medical Care in         Diabetes - 2011,Diabetes Care,2011,34  (Suppl 1):S11-S61.* 01/14/2010   Coronary angiography:   Left mainstem: Normal.  Left anterior descending (LAD): There is a focal 90% stenosis in the mid to distal LAD.  There is a large ramus intermediate branch that trifurcates on the lateral wall. There is a 30% stenosis in the more lateral branch. Otherwise scattered nonobstructive disease.  Left circumflex (LCx): The left circumflex is a small branch without significant disease.  Right coronary artery (RCA): The right coronary is a dominant vessel. The proximal, mid, distal RCA are normal. There is a 95% stenosis in the posterior lateral branch. This is a small branch less than 1.5 mm in diameter.   Left ventriculography: Not performed   PCI Procedure Note: Following the diagnostic procedure, the decision was made to proceed with PCI  of the LAD.  The PL OM branch was felt to be too small for intervention. The LAD lesion correlated well with her nuclear stress test. The sheath was upsized to a 6 [place]. Weight-based bivalirudin was given for anticoagulation. Plavix 600 mg was given orally. Once a therapeutic ACT was achieved, a 6 [place] XB LAD 3.5 guide catheter was inserted. A pro-water coronary guidewire was used to cross the lesion. The lesion was predilated with a 2.0 mm balloon. The lesion was then stented with a 2.5 x 12 mm Promus stent. The stent was postdilated with a 2.5 mm noncompliant balloon. Following PCI, there was 0% residual stenosis and TIMI-3 flow. Final angiography confirmed an excellent result. Femoral hemostasis was achieved with manual compression. The patient tolerated the PCI procedure well. There were no immediate procedural complications. The patient was transferred to the post catheterization recovery area for further monitoring.   PCI Data:  Vessel - LAD/Segment - mid to distal  Percent Stenosis (pre) 90%  TIMI-flow 3  Stent 2.5 x 12 mm Promus  Percent Stenosis (post) 0%  TIMI-flow (post) 3   Final Conclusions:  1. 2 vessel obstructive coronary disease.  2. Successful stenting of the mid to distal LAD with a drug-eluting stent.   Recommendations: Continue medical therapy for the posterior lateral disease. Continue dual antiplatelet therapy for one year.  Theron Arista [facility]  02/23/2012, 11:13 AM   Echo Study Conclusions from November 2013  - Left ventricle: The cavity size was normal. Wall thickness was increased in a pattern of moderate LVH. The estimated ejection fraction was 60%. Wall motion was normal; there were no regional wall motion abnormalities. - Mitral valve: Flat closure of mitral valve, but no prolapse. No significant regurgitation. - Left atrium: The atrium was mildly dilated. - Right atrium: The atrium was mildly dilated. - Tricuspid valve: Trivial regurgitation. - Pulmonary  arteries: PA peak pressure: 31mm Hg (S). - Pericardium, extracardiac: A trivial pericardial effusion was identified posterior to the heart.  Assessment / Plan: 1. CAD - with recent DES to the mid to distal LAD and residual disease in the PL - managed medically. She is committed to one year of Plavix. I have talked with Dr. [place] who did her procedure. He concurs with me that her Plavix is NOT to be interrupted. Ok to start cardiac rehab - she is interested. Encouraged her to keep up her walking at home as well.   2. ESRD - on HD - seeing General Surgery today for consideration of placement of a peritoneal catheter. She is not sure if she is going to proceed. This will have to be done with her on Plavix.   3. HLD - on statin therapy  4. HTN  Overall she is doing well. She has no current cardiac symptoms. We will check follow up labs today. She is to see Dr. Jens Som for her regular follow up in March.   Patient is agreeable to this plan and will call if any problems develop in the interim.

## 2012-03-12 ENCOUNTER — Encounter: Payer: Self-pay | Admitting: Vascular Surgery

## 2012-03-12 ENCOUNTER — Encounter (INDEPENDENT_AMBULATORY_CARE_PROVIDER_SITE_OTHER): Payer: Medicaid Other | Admitting: *Deleted

## 2012-03-12 ENCOUNTER — Ambulatory Visit (INDEPENDENT_AMBULATORY_CARE_PROVIDER_SITE_OTHER): Payer: Medicaid Other | Admitting: Vascular Surgery

## 2012-03-12 VITALS — BP 127/74 | HR 73 | Ht 62.0 in | Wt 162.0 lb

## 2012-03-12 DIAGNOSIS — N186 End stage renal disease: Secondary | ICD-10-CM

## 2012-03-12 HISTORY — DX: End stage renal disease: N18.6

## 2012-03-12 NOTE — Progress Notes (Signed)
[facility] [place]   Patient name: Lori Greer MRN: [id number] DOB: 1970-05-04 Sex: female   Referred by: Dr. Lowell Guitar  Reason for referral:  Chief Complaint  Patient presents with  . New Evaluation    HD access/ Dr. Derrell Lolling - HD TTS    HISTORY OF PRESENT ILLNESS: The patient is a 42 year old female new to hemodialysis. She recently was admitted with progressive renal failure and had a right IJ catheter placed while at [facility]. She is seen today for discussion of long-term hemodialysis access. She's had no difficulty with her catheter today. She is right-handed. She has extensive past history with coronary disease as well. She is diabetic and has hypertension  Past Medical History  Diagnosis Date  . Bell's palsy 05/12/2008  . DIABETES MELLITUS, TYPE II 03/16/2008  . HYPERTENSION 01/06/2010  . OBESITY 03/16/2008  . Proliferative diabetic retinopathy(362.02) 04/23/2009  . STASIS DERMATITIS 07/06/2009  . Fibroid, uterine   . Murmur   . Chronic renal failure     ESRD on HD  . Anemia   . CAD (coronary artery disease) 01/2012    cardiac cath 02/23/2012 - 2 vessel CAD s/p DES to mid-LAD     Past Surgical History  Procedure Laterality Date  . Appendectomy  2012  . Uterine fibroid surgery    . Abdominal hysterectomy    . Abcess drainage  2002    Right axilla  . Insertion of dialysis catheter  January 2014    Right IJ     History   Social History  . Marital Status: Married    Spouse Name: N/A    Number of Children: 5  . Years of Education: N/A   Occupational History  . CNA    Social History Main Topics  . Smoking status: Former Smoker    Types: Cigarettes    Quit date: 03/12/2004  . Smokeless tobacco: Never Used  . Alcohol Use: Yes     Comment: rarely  . Drug Use: No  . Sexually Active: Not Currently   Other Topics Concern  . Not on file   Social History Narrative   5 Children          Family History  Problem Relation Age  of Onset  . Heart disease Mother     MI at age 43  . Diabetes Father   . Kidney failure Father   . Diabetes Sister     1 sister with diabtes  . Diabetes Brother     Allergies as of 03/12/2012  . (No Known Allergies)    Current Outpatient Prescriptions on File Prior to Visit  Medication Sig Dispense Refill  . ACCU-CHEK FASTCLIX LANCETS MISC 1 each by Does not apply route daily.  100 each  3  . amLODipine (NORVASC) 10 MG tablet Take 10 mg by mouth daily.      [name] aspirin EC 81 MG EC tablet Take 1 tablet (81 mg total) by mouth daily.      [name] atenolol (TENORMIN) 50 MG tablet Take 50 mg by mouth at bedtime.      . clopidogrel (PLAVIX) 75 MG tablet Take 1 tablet (75 mg total) by mouth daily with breakfast.  30 tablet  11  . folic acid-vitamin b complex-vitamin c-selenium-zinc (DIALYVITE) 3 MG TABS Take 1 tablet by mouth daily.      [name] glucose blood (ACCU-CHEK ACTIVE STRIPS) test strip Use as instructed  100 each  12  . HYDROcodone-acetaminophen (NORCO/VICODIN) 5-325  MG per tablet Take 1 tablet by mouth every 6 (six) hours as needed for pain.  50 tablet  0  . insulin aspart (NOVOLOG) 100 UNIT/ML injection Inject 6 Units into the skin as needed.       . nitroGLYCERIN (NITRODUR - DOSED IN MG/24 HR) 0.4 mg/hr Place 1 patch onto the skin daily as needed. For chest pain      . pravastatin (PRAVACHOL) 40 MG tablet Take 40 mg by mouth every evening.      . sevelamer (RENVELA) 800 MG tablet Take 1,600 mg by mouth 3 (three) times daily with meals.       No current facility-administered medications on file prior to visit.     REVIEW OF SYSTEMS:  Positives indicated with an "X"  CARDIOVASCULAR:  [ ]  chest pain   [ ]  chest pressure   [ ]  palpitations   [ ]  orthopnea   [ ]  dyspnea on exertion   [ ]  claudication   [ ]  rest pain   [ ]  DVT   [ ]  phlebitis PULMONARY:   [ ]  productive cough   [ ]  asthma   [ ]  wheezing NEUROLOGIC:   [ ]  weakness  [ ]  paresthesias  [ ]  aphasia  [ ]  amaurosis  [ ]   dizziness HEMATOLOGIC:   [ ]  bleeding problems   [ ]  clotting disorders MUSCULOSKELETAL:  [ ]  joint pain   [ ]  joint swelling GASTROINTESTINAL: [ ]   blood in stool  [ ]   hematemesis GENITOURINARY:  [ ]   dysuria  [ ]   hematuria PSYCHIATRIC:  [ ]  history of major depression INTEGUMENTARY:  [ ]  rashes  [ ]  ulcers CONSTITUTIONAL:  [ ]  fever   [ ]  chills  PHYSICAL EXAMINATION:  General: The patient is a well-nourished female, in no acute distress. Vital signs are BP 127/74  Pulse 73  Ht 5\' 2"  (1.575 m)  Wt 162 lb (73.483 kg)  BMI 29.62 kg/m2  SpO2 100% Pulmonary: There is a good air exchange bilaterally  Abdomen: Soft and non-tender with normal pitch bowel sounds. Musculoskeletal: There are no major deformities.  There is no significant extremity pain. Neurologic: No focal weakness or paresthesias are detected, Skin: There are no ulcer or rashes noted. Psychiatric: The patient has normal affect. Pulse status 2+ radial pulses bilaterally She does have palpable antecubital veins bilaterally. Her cervix veins are not very prominent   [facility] Vascular Lab Studies:  Ordered and Independently Reviewed this shows cephalic vein on the left and the 0.35 -0.5cm range. Similar size on the right. She does have patent basilic veins bilaterally  Impression and Plan:  A long discussion with the patient regarding hemodialysis access with the catheter and with the AV fistula graft. I imaged remains myself with the SonoSite ultrasound. She does have patency of the cephalic vein but my imaging showed somewhat smaller than she obtained in the vascular lab vein map. I do feel that she is a candidate for attempted fistula on the left. I explained we would do a re\re images the time of surgery and proceed with either a wrist radiocephalic or antecubital brachiocephalic fistula. I explained the chance of non-maturation and that she would require additional procedures in the future for patency of her fistula. She  understands and wishes to proceed. This is been scheduled for a 03/25/2012 as an outpatient    EARLY, TODD [facility] [place] Office: [phone]

## 2012-03-15 ENCOUNTER — Other Ambulatory Visit: Payer: Self-pay

## 2012-03-21 ENCOUNTER — Encounter (HOSPITAL_COMMUNITY): Payer: Self-pay | Admitting: Respiratory Therapy

## 2012-03-22 ENCOUNTER — Encounter (INDEPENDENT_AMBULATORY_CARE_PROVIDER_SITE_OTHER): Payer: Self-pay | Admitting: General Surgery

## 2012-03-22 ENCOUNTER — Ambulatory Visit (INDEPENDENT_AMBULATORY_CARE_PROVIDER_SITE_OTHER): Payer: No Typology Code available for payment source | Admitting: General Surgery

## 2012-03-22 ENCOUNTER — Telehealth: Payer: Self-pay | Admitting: *Deleted

## 2012-03-22 VITALS — BP 132/80 | HR 64 | Temp 98.1°F | Resp 16 | Ht 62.0 in | Wt 162.2 lb

## 2012-03-22 DIAGNOSIS — Z4902 Encounter for fitting and adjustment of peritoneal dialysis catheter: Secondary | ICD-10-CM

## 2012-03-22 NOTE — Telephone Encounter (Signed)
Lori Greer called to cancel her surgery.Said she did not want to have it at this time and did not want to reschedule/jjk

## 2012-03-22 NOTE — Progress Notes (Signed)
Patient ID: Lori Greer, female   DOB: 12-31-1970, 42 y.o.   MRN: [id number]  Chief Complaint  Patient presents with  . Advice Only    pt wants to discuss pd cath    HPI Lori Greer is a 42 y.o. female.  The patient is a 41 year old female was recently seen for evaluation of peritoneal dialysis catheter. The patient was evaluated by Dr. Arbie Cookey for AV fistula and was to be scheduled, but the patient has changed her mind and wished to proceed with peritoneal dialysis catheter. The patient was previously worked up and diagnosed with coronary artery disease and a stent placed and was placed on Plavix for a minimum of one year. The patient is currently undergoing hemodialysis via a percutaneous dialysis catheter. The patient sees Dr. Lowell Guitar who is her nephrologist.  HPI  Past Medical History  Diagnosis Date  . Bell's palsy 05/12/2008  . DIABETES MELLITUS, TYPE II 03/16/2008  . HYPERTENSION 01/06/2010  . OBESITY 03/16/2008  . Proliferative diabetic retinopathy(362.02) 04/23/2009  . STASIS DERMATITIS 07/06/2009  . Fibroid, uterine   . Murmur   . Chronic renal failure     ESRD on HD  . Anemia   . CAD (coronary artery disease) 01/2012    cardiac cath 02/23/2012 - 2 vessel CAD s/p DES to mid-LAD     Past Surgical History  Procedure Laterality Date  . Appendectomy  2012  . Uterine fibroid surgery    . Abdominal hysterectomy    . Abcess drainage  2002    Right axilla  . Insertion of dialysis catheter  January 2014    Right IJ     Family History  Problem Relation Age of Onset  . Heart disease Mother     MI at age 52  . Diabetes Father   . Kidney failure Father   . Diabetes Sister     1 sister with diabtes  . Diabetes Brother     Social History History  Substance Use Topics  . Smoking status: Former Smoker    Types: Cigarettes    Quit date: 03/12/2004  . Smokeless tobacco: Never Used  . Alcohol Use: Yes     Comment: rarely    No Known Allergies  Current Outpatient  Prescriptions  Medication Sig Dispense Refill  . ACCU-CHEK FASTCLIX LANCETS MISC 1 each by Does not apply route daily.  100 each  3  . amLODipine (NORVASC) 10 MG tablet Take 10 mg by mouth daily.      [name] aspirin EC 81 MG EC tablet Take 1 tablet (81 mg total) by mouth daily.      [name] atenolol (TENORMIN) 50 MG tablet Take 50 mg by mouth at bedtime.      . clopidogrel (PLAVIX) 75 MG tablet Take 1 tablet (75 mg total) by mouth daily with breakfast.  30 tablet  11  . folic acid-vitamin b complex-vitamin c-selenium-zinc (DIALYVITE) 3 MG TABS Take 1 tablet by mouth daily.      [name] glucose blood (ACCU-CHEK ACTIVE STRIPS) test strip Use as instructed  100 each  12  . HYDROcodone-acetaminophen (NORCO/VICODIN) 5-325 MG per tablet Take 1 tablet by mouth every 6 (six) hours as needed for pain.  50 tablet  0  . insulin aspart (NOVOLOG) 100 UNIT/ML injection Inject 6 Units into the skin as needed.       . nitroGLYCERIN (NITRODUR - DOSED IN MG/24 HR) 0.4 mg/hr Place 1 patch onto the skin daily as needed. For chest pain      .  pravastatin (PRAVACHOL) 40 MG tablet Take 40 mg by mouth every evening.      . sevelamer (RENVELA) 800 MG tablet Take 1,600 mg by mouth 3 (three) times daily with meals.       No current facility-administered medications for this visit.    Review of Systems Review of Systems  Blood pressure 132/80, pulse 64, temperature 98.1 F (36.7 C), temperature source Temporal, resp. rate 16, height 5\' 2"  (1.575 m), weight 162 lb 3.2 oz (73.573 kg).  Physical Exam Physical Exam  Data Reviewed none  Assessment    The patient is a 42 year old female with end-stage renal disease, in need of a peritoneal dialysis catheter. The patient has had a C-section in the past which will require lysis of adhesions.    Plan    1. We'll proceed to the operating room for a laparoscopic lysis of adhesions and placement of peritoneal dialysis catheter. I discussed the surgical plan with the patient as well as  complications to include bleeding, infection, malfunction of the catheter, injury to surrounding organs, and possible bowel injury. The patient voiced understanding and wished to proceed with the procedure.        Marigene Ehlers., Aleina Burgio 03/22/2012, 2:30 PM

## 2012-03-25 ENCOUNTER — Telehealth (INDEPENDENT_AMBULATORY_CARE_PROVIDER_SITE_OTHER): Payer: Self-pay

## 2012-03-25 NOTE — Telephone Encounter (Signed)
V/M not sure of medication and/or pharmacy patient wishes to use .Advised patient to call Lawson Fiscal  10 am  She is DR. Derrell Lolling assistant that would help her with her medication request .

## 2012-03-26 ENCOUNTER — Telehealth (INDEPENDENT_AMBULATORY_CARE_PROVIDER_SITE_OTHER): Payer: Self-pay | Admitting: General Surgery

## 2012-03-26 NOTE — Telephone Encounter (Signed)
Got a staff message from [place] who had gotten it from [place] where the patient was asking for pain med but Elane Fritz stated that it might be the wrong patient.Lori KitchenMarland KitchenI called the patient just in case and she did not call for any pain med or any other reason.Lori Greer

## 2012-03-27 ENCOUNTER — Encounter (HOSPITAL_COMMUNITY): Admission: RE | Payer: Self-pay | Source: Ambulatory Visit

## 2012-03-27 ENCOUNTER — Ambulatory Visit (HOSPITAL_COMMUNITY): Admission: RE | Admit: 2012-03-27 | Payer: Medicaid Other | Source: Ambulatory Visit | Admitting: Vascular Surgery

## 2012-03-27 ENCOUNTER — Encounter (HOSPITAL_COMMUNITY): Payer: Self-pay | Admitting: Pharmacy Technician

## 2012-03-27 SURGERY — ARTERIOVENOUS (AV) FISTULA CREATION
Anesthesia: Monitor Anesthesia Care | Site: Arm Lower | Laterality: Left

## 2012-04-03 ENCOUNTER — Encounter (HOSPITAL_COMMUNITY)
Admission: RE | Admit: 2012-04-03 | Discharge: 2012-04-03 | Disposition: A | Discharge status: Home / Self Care | Payer: Medicaid Other | Source: Ambulatory Visit | Attending: General Surgery | Admitting: General Surgery

## 2012-04-03 ENCOUNTER — Encounter (HOSPITAL_COMMUNITY): Payer: Self-pay

## 2012-04-03 LAB — CBC
HCT: 32.9 % — ABNORMAL LOW (ref 36.0–46.0)
Hemoglobin: 11.3 g/dL — ABNORMAL LOW (ref 12.0–15.0)
MCH: 29.8 pg (ref 26.0–34.0)
MCHC: 34.3 g/dL (ref 30.0–36.0)
MCV: 86.8 fL (ref 78.0–100.0)
Platelets: 203 10*3/uL (ref 150–400)
RBC: 3.79 MIL/uL — ABNORMAL LOW (ref 3.87–5.11)
RDW: 15.4 % (ref 11.5–15.5)
WBC: 7.4 10*3/uL (ref 4.0–10.5)

## 2012-04-03 LAB — BASIC METABOLIC PANEL
BUN: 60 mg/dL — ABNORMAL HIGH (ref 6–23)
CO2: 32 mEq/L (ref 19–32)
Calcium: 9.3 mg/dL (ref 8.4–10.5)
Chloride: 94 mEq/L — ABNORMAL LOW (ref 96–112)
Creatinine, Ser: 8.01 mg/dL — ABNORMAL HIGH (ref 0.50–1.10)
GFR calc Af Amer: 6 mL/min — ABNORMAL LOW (ref >90)
GFR calc non Af Amer: 6 mL/min — ABNORMAL LOW (ref >90)
Glucose, Bld: 122 mg/dL — ABNORMAL HIGH (ref 70–99)
Potassium: 3.6 mEq/L (ref 3.5–5.1)
Sodium: 138 mEq/L (ref 135–145)

## 2012-04-03 LAB — SURGICAL PCR SCREEN
MRSA, PCR: NEGATIVE
Staphylococcus aureus: NEGATIVE

## 2012-04-03 MED ORDER — CHLORHEXIDINE GLUCONATE 4 % EX LIQD: 1.0000 "application " | Freq: Once | CUTANEOUS | Status: DC

## 2012-04-03 NOTE — Pre-Procedure Instructions (Signed)
Lori Greer  04/03/2012   Your procedure is scheduled on:  Monday April 08, 2012  Report to [facility] at 7:30 AM.  Call this number if you have problems the morning of surgery: [phone]   Remember:   Do not eat food or drink liquids after midnight.   Take these medicines the morning of surgery with A SIP OF WATER: amlodipine, atenolol, hydrocodone   Do not wear jewelry, make-up or nail polish.  Do not wear lotions, powders, or perfumes.   Do not shave 48 hours prior to surgery.  Do not bring valuables to the hospital.  Contacts, dentures or bridgework may not be worn into surgery.  Leave suitcase in the car. After surgery it may be brought to your room.  For patients admitted to the hospital, checkout time is 11:00 AM the day of  discharge.   Patients discharged the day of surgery will not be allowed to drive  home.  Name and phone number of your driver: family / friend  Special Instructions: Shower using CHG 2 nights before surgery and the night before surgery.  If you shower the day of surgery use CHG.  Use special wash - you have one bottle of CHG for all showers.  You should use approximately 1/3 of the bottle for each shower.   Please read over the following fact sheets that you were given: Pain Booklet, Coughing and Deep Breathing, MRSA Information and Surgical Site Infection Prevention

## 2012-04-04 NOTE — Progress Notes (Signed)
Anesthesia chart review: Patient is a 42 year old female scheduled for insertion of peritoneal dialysis catheter by Dr. Derrell Lolling on 04/08/2012. History includes end-stage renal disease currently undergoing hemodialysis TTS via a percutaneous dialysis catheter, CAD status post LAD DES 02/23/12, diabetes mellitus type 2, diabetic retinopathy, hypertension, obesity, anemia, stasis dermatitis, former smoker, hysterectomy. Nephrologist is Dr. Lowell Guitar.   Cardiologist is Dr. Jens Som, who recommended a cardiology office visit prior to PD catheter insertion.  She was seen by [facility] nurse practitioner Norma Fredrickson on 03/11/2012 who stated that if the procedure was required soon, it would need to be done while patient remained on Plavix.  Cardiac cath on 02/23/2012 showed normal left main, 90% mid to distal LAD stenosis, a large ramus intermediate branch that trifurcates on the lateral wall with 30% stenosis in the more lateral branch otherwise scattered nonobstructive disease. The left circumflex is a small branch without significant disease. The RCA is a dominant vessel. The proximal, mid, and distal RCA are normal. There is a 95% stenosis of the posterior lateral branch which is a small branch less than 1.50 m in diameter. She underwent successful stenting of the mid to distal LAD with a drug-eluting stent. Continued medical therapy was recommended for the posterior-lateral disease. Dual antiplatelet therapy was recommended for one year.  Echo on 12/05/11 showed: - Left ventricle: The cavity size was normal. Wall thickness was increased in a pattern of moderate LVH. The estimated ejection fraction was 60%. Wall motion was normal; there were no regional wall motion abnormalities. - Mitral valve: Flat closure of mitral valve, but no prolapse. No significant regurgitation. - Left atrium: The atrium was mildly dilated. - Right atrium: The atrium was mildly dilated. - Tricuspid valve: Trivial  regurgitation. - Pulmonary arteries: PA peak pressure: 31mm Hg (S). - Pericardium, extracardiac: A trivial pericardial effusion was identified posterior to the heart.  EKG on 02/24/12 showed NSR, prolonged QT.  Chest x-ray on 09/21/2011 at [facility] in [place] showed enlargement of the cardiac silhouette without cardiac enlargement without actute cardiopulmonary disease.  Preoperative labs noted. I-STAT day of surgery.  Dr. Derrell Lolling has already notified [facility] of planned procedure.  Patient is to remain on Plavix for this procedure.  If no acute change and follow-up labs are reasonable then would anticipate she could proceed.  Velna Ochs [facility] Short Stay Center/Anesthesiology Phone [phone] 04/04/2012 1:02 PM

## 2012-04-07 MED ORDER — DEXTROSE 5 % IV SOLN
1.5000 g | INTRAVENOUS | Status: DC
  Filled 2012-04-07: qty 1.5

## 2012-04-08 ENCOUNTER — Encounter (HOSPITAL_COMMUNITY)
Admission: RE | Disposition: A | Discharge status: Home / Self Care | Payer: Self-pay | Source: Ambulatory Visit | Attending: General Surgery

## 2012-04-08 ENCOUNTER — Ambulatory Visit (HOSPITAL_COMMUNITY): Payer: Medicaid Other | Admitting: Certified Registered"

## 2012-04-08 ENCOUNTER — Encounter (HOSPITAL_COMMUNITY): Payer: Self-pay | Admitting: Vascular Surgery

## 2012-04-08 ENCOUNTER — Ambulatory Visit (HOSPITAL_COMMUNITY)
Admission: RE | Admit: 2012-04-08 | Discharge: 2012-04-08 | Disposition: A | Discharge status: Home / Self Care | Payer: Medicaid Other | Source: Ambulatory Visit | Attending: General Surgery | Admitting: General Surgery

## 2012-04-08 ENCOUNTER — Encounter (HOSPITAL_COMMUNITY): Payer: Self-pay | Admitting: *Deleted

## 2012-04-08 DIAGNOSIS — E669 Obesity, unspecified: Secondary | ICD-10-CM | POA: Insufficient documentation

## 2012-04-08 DIAGNOSIS — N186 End stage renal disease: Secondary | ICD-10-CM

## 2012-04-08 DIAGNOSIS — Z79899 Other long term (current) drug therapy: Secondary | ICD-10-CM | POA: Insufficient documentation

## 2012-04-08 DIAGNOSIS — I739 Peripheral vascular disease, unspecified: Secondary | ICD-10-CM | POA: Insufficient documentation

## 2012-04-08 DIAGNOSIS — E1139 Type 2 diabetes mellitus with other diabetic ophthalmic complication: Secondary | ICD-10-CM | POA: Insufficient documentation

## 2012-04-08 DIAGNOSIS — I2510 Atherosclerotic heart disease of native coronary artery without angina pectoris: Secondary | ICD-10-CM | POA: Insufficient documentation

## 2012-04-08 DIAGNOSIS — E11319 Type 2 diabetes mellitus with unspecified diabetic retinopathy without macular edema: Secondary | ICD-10-CM | POA: Insufficient documentation

## 2012-04-08 DIAGNOSIS — Z01812 Encounter for preprocedural laboratory examination: Secondary | ICD-10-CM | POA: Insufficient documentation

## 2012-04-08 DIAGNOSIS — Z794 Long term (current) use of insulin: Secondary | ICD-10-CM | POA: Insufficient documentation

## 2012-04-08 DIAGNOSIS — K432 Incisional hernia without obstruction or gangrene: Secondary | ICD-10-CM | POA: Insufficient documentation

## 2012-04-08 DIAGNOSIS — I120 Hypertensive chronic kidney disease with stage 5 chronic kidney disease or end stage renal disease: Secondary | ICD-10-CM | POA: Insufficient documentation

## 2012-04-08 HISTORY — PX: CAPD INSERTION: SHX5233

## 2012-04-08 LAB — POCT I-STAT 4, (NA,K, GLUC, HGB,HCT)
Glucose, Bld: 111 mg/dL — ABNORMAL HIGH (ref 70–99)
HCT: 32 % — ABNORMAL LOW (ref 36.0–46.0)
Hemoglobin: 10.9 g/dL — ABNORMAL LOW (ref 12.0–15.0)
Potassium: 3.5 mEq/L (ref 3.5–5.1)
Sodium: 135 mEq/L (ref 135–145)

## 2012-04-08 LAB — GLUCOSE, CAPILLARY
Glucose-Capillary: 103 mg/dL — ABNORMAL HIGH (ref 70–99)
Glucose-Capillary: 113 mg/dL — ABNORMAL HIGH (ref 70–99)

## 2012-04-08 LAB — HCG, SERUM, QUALITATIVE: Preg, Serum: NEGATIVE

## 2012-04-08 SURGERY — LAPAROSCOPIC INSERTION CONTINUOUS AMBULATORY PERITONEAL DIALYSIS  (CAPD) CATHETER
Anesthesia: General | Site: Abdomen | Wound class: Clean Contaminated

## 2012-04-08 MED ORDER — SODIUM CHLORIDE 0.9 % IV SOLN: 100.0000 mL | INTRAVENOUS | Status: DC | PRN

## 2012-04-08 MED ORDER — MEPERIDINE HCL 25 MG/ML IJ SOLN: 6.2500 mg | INTRAMUSCULAR | Status: DC | PRN

## 2012-04-08 MED ORDER — 0.9 % SODIUM CHLORIDE (POUR BTL) OPTIME
TOPICAL | Status: DC | PRN
  Administered 2012-04-08: 1000 mL

## 2012-04-08 MED ORDER — OXYCODONE HCL 5 MG PO TABS
5.0000 mg | ORAL_TABLET | ORAL | Status: DC | PRN
  Administered 2012-04-08: 10 mg via ORAL

## 2012-04-08 MED ORDER — SODIUM CHLORIDE 0.9 % IV SOLN
100.0000 mL | INTRAVENOUS | Status: DC | PRN
  Administered 2012-04-08: 10:00:00 via INTRAVENOUS

## 2012-04-08 MED ORDER — CEFAZOLIN SODIUM-DEXTROSE 2-3 GM-% IV SOLR
2.0000 g | INTRAVENOUS | Status: AC
  Administered 2012-04-08: 2 g via INTRAVENOUS
  Filled 2012-04-08: qty 50

## 2012-04-08 MED ORDER — LIDOCAINE-PRILOCAINE 2.5-2.5 % EX CREA: 1.0000 "application " | TOPICAL_CREAM | CUTANEOUS | Status: DC | PRN

## 2012-04-08 MED ORDER — LIDOCAINE HCL (PF) 1 % IJ SOLN
5.0000 mL | INTRAMUSCULAR | Status: DC | PRN
  Filled 2012-04-08: qty 5

## 2012-04-08 MED ORDER — BUPIVACAINE HCL (PF) 0.25 % IJ SOLN
INTRAMUSCULAR | Status: DC | PRN
  Administered 2012-04-08: 6 mL

## 2012-04-08 MED ORDER — ONDANSETRON HCL 4 MG/2ML IJ SOLN: 4.0000 mg | Freq: Once | INTRAMUSCULAR | Status: DC | PRN

## 2012-04-08 MED ORDER — OXYCODONE HCL 5 MG/5ML PO SOLN: 5.0000 mg | Freq: Once | ORAL | Status: DC | PRN

## 2012-04-08 MED ORDER — PENTAFLUOROPROP-TETRAFLUOROETH EX AERO: 1.0000 "application " | INHALATION_SPRAY | CUTANEOUS | Status: DC | PRN

## 2012-04-08 MED ORDER — ONDANSETRON HCL 4 MG/2ML IJ SOLN
INTRAMUSCULAR | Status: DC | PRN
  Administered 2012-04-08: 4 mg via INTRAVENOUS

## 2012-04-08 MED ORDER — OXYCODONE HCL 5 MG PO TABS
ORAL_TABLET | ORAL | Status: AC
  Filled 2012-04-08: qty 2

## 2012-04-08 MED ORDER — SODIUM CHLORIDE 0.9 % IV SOLN: INTRAVENOUS | Status: DC

## 2012-04-08 MED ORDER — ROCURONIUM BROMIDE 100 MG/10ML IV SOLN
INTRAVENOUS | Status: DC | PRN
  Administered 2012-04-08: 10 mg via INTRAVENOUS
  Administered 2012-04-08: 40 mg via INTRAVENOUS

## 2012-04-08 MED ORDER — OXYCODONE-ACETAMINOPHEN 10-325 MG PO TABS: 1.0000 | ORAL_TABLET | ORAL | Status: DC | PRN

## 2012-04-08 MED ORDER — OXYCODONE HCL 5 MG PO TABS: 5.0000 mg | ORAL_TABLET | Freq: Once | ORAL | Status: DC | PRN

## 2012-04-08 MED ORDER — FENTANYL CITRATE 0.05 MG/ML IJ SOLN
INTRAMUSCULAR | Status: DC | PRN
  Administered 2012-04-08: 50 ug via INTRAVENOUS
  Administered 2012-04-08: 150 ug via INTRAVENOUS

## 2012-04-08 MED ORDER — BUPIVACAINE HCL (PF) 0.25 % IJ SOLN
INTRAMUSCULAR | Status: AC
  Filled 2012-04-08: qty 30

## 2012-04-08 MED ORDER — ALTEPLASE 2 MG IJ SOLR: 2.0000 mg | Freq: Once | INTRAMUSCULAR | Status: DC | PRN

## 2012-04-08 MED ORDER — HEPARIN SODIUM (PORCINE) 1000 UNIT/ML DIALYSIS: 1000.0000 [IU] | INTRAMUSCULAR | Status: DC | PRN

## 2012-04-08 MED ORDER — PROPOFOL 10 MG/ML IV BOLUS
INTRAVENOUS | Status: DC | PRN
  Administered 2012-04-08: 100 mg via INTRAVENOUS

## 2012-04-08 MED ORDER — SODIUM CHLORIDE 0.9 % IV SOLN
INTRAVENOUS | Status: DC
  Administered 2012-04-08: 09:00:00 via INTRAVENOUS

## 2012-04-08 MED ORDER — HYDROMORPHONE HCL PF 1 MG/ML IJ SOLN: 0.2500 mg | INTRAMUSCULAR | Status: DC | PRN

## 2012-04-08 MED ORDER — MIDAZOLAM HCL 5 MG/5ML IJ SOLN
INTRAMUSCULAR | Status: DC | PRN
  Administered 2012-04-08: 2 mg via INTRAVENOUS

## 2012-04-08 MED ORDER — LIDOCAINE HCL (CARDIAC) 20 MG/ML IV SOLN
INTRAVENOUS | Status: DC | PRN
  Administered 2012-04-08: 60 mg via INTRAVENOUS

## 2012-04-08 MED ORDER — NEPRO/CARBSTEADY PO LIQD: 237.0000 mL | ORAL | Status: DC | PRN

## 2012-04-08 SURGICAL SUPPLY — 53 items
ADAPTER CATH SAFE LCK II CO PK (MISCELLANEOUS) ×4 IMPLANT
ADAPTER SAFE LOCK II CO PACK (MISCELLANEOUS) ×2
BENZOIN TINCTURE PRP APPL 2/3 (GAUZE/BANDAGES/DRESSINGS) ×3 IMPLANT
BLADE SURG 15 STRL LF DISP TIS (BLADE) ×2 IMPLANT
BLADE SURG 15 STRL SS (BLADE) ×1
CANISTER SUCTION 2500CC (MISCELLANEOUS) ×3 IMPLANT
CHLORAPREP W/TINT 26ML (MISCELLANEOUS) ×3 IMPLANT
CLOTH BEACON ORANGE TIMEOUT ST (SAFETY) ×3 IMPLANT
CLSR STERI-STRIP ANTIMIC 1/2X4 (GAUZE/BANDAGES/DRESSINGS) ×3 IMPLANT
COIL SWAN NECK LT (MISCELLANEOUS) ×3 IMPLANT
COVER SURGICAL LIGHT HANDLE (MISCELLANEOUS) ×3 IMPLANT
DEVICE TROCAR PUNCTURE CLOSURE (ENDOMECHANICALS) ×3 IMPLANT
DRAPE LAPAROTOMY T 102X78X121 (DRAPES) ×3 IMPLANT
DRAPE UTILITY 15X26 W/TAPE STR (DRAPE) ×6 IMPLANT
DRAPE WARM FLUID 44X44 (DRAPE) ×3 IMPLANT
ELECT CAUTERY BLADE 6.4 (BLADE) ×3 IMPLANT
ELECT REM PT RETURN 9FT ADLT (ELECTROSURGICAL) ×6
ELECTRODE REM PT RTRN 9FT ADLT (ELECTROSURGICAL) ×4 IMPLANT
GAUZE SPONGE 2X2 8PLY STRL LF (GAUZE/BANDAGES/DRESSINGS) ×2 IMPLANT
GAUZE SPONGE 4X4 16PLY XRAY LF (GAUZE/BANDAGES/DRESSINGS) ×3 IMPLANT
GLOVE BIO SURGEON STRL SZ7.5 (GLOVE) ×6 IMPLANT
GLOVE BIOGEL PI IND STRL 8 (GLOVE) ×2 IMPLANT
GLOVE BIOGEL PI INDICATOR 8 (GLOVE) ×1
GOWN PREVENTION PLUS XLARGE (GOWN DISPOSABLE) ×3 IMPLANT
GOWN STRL NON-REIN LRG LVL3 (GOWN DISPOSABLE) ×6 IMPLANT
GOWN STRL REIN XL XLG (GOWN DISPOSABLE) ×3 IMPLANT
KIT BASIN OR (CUSTOM PROCEDURE TRAY) ×6 IMPLANT
KIT ROOM TURNOVER OR (KITS) ×6 IMPLANT
NEEDLE 22X1 1/2 (OR ONLY) (NEEDLE) ×3 IMPLANT
NEEDLE HYPO 25GX1X1/2 BEV (NEEDLE) ×3 IMPLANT
NS IRRIG 1000ML POUR BTL (IV SOLUTION) ×6 IMPLANT
PACK SURGICAL SETUP 50X90 (CUSTOM PROCEDURE TRAY) ×3 IMPLANT
PAD ARMBOARD 7.5X6 YLW CONV (MISCELLANEOUS) ×6 IMPLANT
PENCIL BUTTON HOLSTER BLD 10FT (ELECTRODE) ×3 IMPLANT
SCISSORS LAP 5X35 DISP (ENDOMECHANICALS) ×3 IMPLANT
SLEEVE ENDOPATH XCEL 5M (ENDOMECHANICALS) ×6 IMPLANT
SPONGE GAUZE 2X2 STER 10/PKG (GAUZE/BANDAGES/DRESSINGS) ×1
SPONGE GAUZE 4X4 12PLY (GAUZE/BANDAGES/DRESSINGS) ×3 IMPLANT
SPONGE LAP 4X18 X RAY DECT (DISPOSABLE) ×3 IMPLANT
SUT CHROMIC 3 0 SH 27 (SUTURE) ×6 IMPLANT
SUT ETHILON 2 0 FS 18 (SUTURE) ×3 IMPLANT
SUT ETHILON 5 0 PS 2 18 (SUTURE) ×3 IMPLANT
SUT MNCRL AB 4-0 PS2 18 (SUTURE) ×3 IMPLANT
SUT VIC AB 2-0 CT3 27 (SUTURE) ×3 IMPLANT
SYR 30ML LL (SYRINGE) ×3 IMPLANT
SYR CONTROL 10ML LL (SYRINGE) ×3 IMPLANT
TAPE CLOTH SOFT 2X10 (GAUZE/BANDAGES/DRESSINGS) ×3 IMPLANT
TOWEL OR 17X24 6PK STRL BLUE (TOWEL DISPOSABLE) ×3 IMPLANT
TOWEL OR 17X26 10 PK STRL BLUE (TOWEL DISPOSABLE) ×3 IMPLANT
TRAY LAPAROSCOPIC (CUSTOM PROCEDURE TRAY) ×3 IMPLANT
TROCAR FALLER TUNNELING (TROCAR) ×3 IMPLANT
TROCAR XCEL NON-BLD 11X100MML (ENDOMECHANICALS) ×3 IMPLANT
TROCAR XCEL NON-BLD 5MMX100MML (ENDOMECHANICALS) ×3 IMPLANT

## 2012-04-08 NOTE — Anesthesia Procedure Notes (Signed)
Procedure Name: Intubation Date/Time: 04/08/2012 9:45 AM Performed by: Ellin Goodie Pre-anesthesia Checklist: Patient identified, Emergency Drugs available, Suction available, Patient being monitored and Timeout performed Patient Re-evaluated:Patient Re-evaluated prior to inductionOxygen Delivery Method: Circle system utilized Preoxygenation: Pre-oxygenation with 100% oxygen Intubation Type: IV induction Ventilation: Mask ventilation without difficulty Laryngoscope Size: Mac and 3 Grade View: Grade I Tube type: Oral Tube size: 7.5 mm Number of attempts: 2 Airway Equipment and Method: Stylet Placement Confirmation: ETT inserted through vocal cords under direct vision,  positive ETCO2 and breath sounds checked- equal and bilateral Secured at: 22 cm Tube secured with: Tape Dental Injury: Teeth and Oropharynx as per pre-operative assessment  Comments: Intubation attempt x 1 by EMT student with esophageal intubation noted.  Intubation done by Carlynn Herald, CRNA on second attempt.

## 2012-04-08 NOTE — Interval H&P Note (Signed)
History and Physical Interval Note:  04/08/2012 6:58 AM  Lori Greer  has presented today for surgery, with the diagnosis of end stage renal disease  The various methods of treatment have been discussed with the patient and family. After consideration of risks, benefits and other options for treatment, the patient has consented to  Procedure(s):  PERITONEAL DIALYSIS CATHETER INSERTION (N/A) as a surgical intervention .  The patient's history has been reviewed, patient examined, no change in status, stable for surgery.  I have reviewed the patient's chart and labs.  Questions were answered to the patient's satisfaction.     Marigene Ehlers., Jed Limerick

## 2012-04-08 NOTE — H&P (View-Only) (Signed)
Patient ID: Lori Greer, female   DOB: July 05, 1970, 42 y.o.   MRN: [id number]  Chief Complaint  Patient presents with  . Advice Only    pt wants to discuss pd cath    HPI Lori Greer is a 42 y.o. female.  The patient is a 42 year old female was recently seen for evaluation of peritoneal dialysis catheter. The patient was evaluated by Dr. Arbie Cookey for AV fistula and was to be scheduled, but the patient has changed her mind and wished to proceed with peritoneal dialysis catheter. The patient was previously worked up and diagnosed with coronary artery disease and a stent placed and was placed on Plavix for a minimum of one year. The patient is currently undergoing hemodialysis via a percutaneous dialysis catheter. The patient sees Dr. Lowell Guitar who is her nephrologist.  HPI  Past Medical History  Diagnosis Date  . Bell's palsy 05/12/2008  . DIABETES MELLITUS, TYPE II 03/16/2008  . HYPERTENSION 01/06/2010  . OBESITY 03/16/2008  . Proliferative diabetic retinopathy(362.02) 04/23/2009  . STASIS DERMATITIS 07/06/2009  . Fibroid, uterine   . Murmur   . Chronic renal failure     ESRD on HD  . Anemia   . CAD (coronary artery disease) 01/2012    cardiac cath 02/23/2012 - 2 vessel CAD s/p DES to mid-LAD     Past Surgical History  Procedure Laterality Date  . Appendectomy  2012  . Uterine fibroid surgery    . Abdominal hysterectomy    . Abcess drainage  2002    Right axilla  . Insertion of dialysis catheter  January 2014    Right IJ     Family History  Problem Relation Age of Onset  . Heart disease Mother     MI at age 75  . Diabetes Father   . Kidney failure Father   . Diabetes Sister     1 sister with diabtes  . Diabetes Brother     Social History History  Substance Use Topics  . Smoking status: Former Smoker    Types: Cigarettes    Quit date: 03/12/2004  . Smokeless tobacco: Never Used  . Alcohol Use: Yes     Comment: rarely    No Known Allergies  Current Outpatient  Prescriptions  Medication Sig Dispense Refill  . ACCU-CHEK FASTCLIX LANCETS MISC 1 each by Does not apply route daily.  100 each  3  . amLODipine (NORVASC) 10 MG tablet Take 10 mg by mouth daily.      [name] aspirin EC 81 MG EC tablet Take 1 tablet (81 mg total) by mouth daily.      [name] atenolol (TENORMIN) 50 MG tablet Take 50 mg by mouth at bedtime.      . clopidogrel (PLAVIX) 75 MG tablet Take 1 tablet (75 mg total) by mouth daily with breakfast.  30 tablet  11  . folic acid-vitamin b complex-vitamin c-selenium-zinc (DIALYVITE) 3 MG TABS Take 1 tablet by mouth daily.      [name] glucose blood (ACCU-CHEK ACTIVE STRIPS) test strip Use as instructed  100 each  12  . HYDROcodone-acetaminophen (NORCO/VICODIN) 5-325 MG per tablet Take 1 tablet by mouth every 6 (six) hours as needed for pain.  50 tablet  0  . insulin aspart (NOVOLOG) 100 UNIT/ML injection Inject 6 Units into the skin as needed.       . nitroGLYCERIN (NITRODUR - DOSED IN MG/24 HR) 0.4 mg/hr Place 1 patch onto the skin daily as needed. For chest pain      .  pravastatin (PRAVACHOL) 40 MG tablet Take 40 mg by mouth every evening.      . sevelamer (RENVELA) 800 MG tablet Take 1,600 mg by mouth 3 (three) times daily with meals.       No current facility-administered medications for this visit.    Review of Systems Review of Systems  Blood pressure 132/80, pulse 64, temperature 98.1 F (36.7 C), temperature source Temporal, resp. rate 16, height 5\' 2"  (1.575 m), weight 162 lb 3.2 oz (73.573 kg).  Physical Exam Physical Exam  Data Reviewed none  Assessment    The patient is a 42 year old female with end-stage renal disease, in need of a peritoneal dialysis catheter. The patient has had a C-section in the past which will require lysis of adhesions.    Plan    1. We'll proceed to the operating room for a laparoscopic lysis of adhesions and placement of peritoneal dialysis catheter. I discussed the surgical plan with the patient as well as  complications to include bleeding, infection, malfunction of the catheter, injury to surrounding organs, and possible bowel injury. The patient voiced understanding and wished to proceed with the procedure.        Marigene Ehlers., Nimo Verastegui 03/22/2012, 2:30 PM

## 2012-04-08 NOTE — Transfer of Care (Signed)
Immediate Anesthesia Transfer of Care Note  Patient: Lori Greer  Procedure(s) Performed: Procedure(s): LAPAROSCOPIC INSERTION CONTINUOUS AMBULATORY PERITONEAL DIALYSIS  (CAPD) CATHETER (Left)  Patient Location: PACU  Anesthesia Type:General  Level of Consciousness: awake, alert  and oriented  Airway & Oxygen Therapy: Patient connected to face mask oxygen  Post-op Assessment: Report given to PACU RN  Post vital signs: stable  Complications: No apparent anesthesia complications

## 2012-04-08 NOTE — Preoperative (Signed)
Beta Blockers   Reason not to administer Beta Blockers:Not Applicable 

## 2012-04-08 NOTE — Anesthesia Preprocedure Evaluation (Addendum)
Anesthesia Evaluation  Patient identified by MRN, date of birth, ID band Patient awake    Reviewed: Allergy & Precautions, H&P , NPO status , Patient's Chart, lab work & pertinent test results  Airway Mallampati: I TM Distance: >3 FB Neck ROM: Full    Dental   Pulmonary          Cardiovascular hypertension, + CAD and + Peripheral Vascular Disease     Neuro/Psych    GI/Hepatic   Endo/Other  diabetes, Type 2, Insulin Dependent  Renal/GU Dialysis and ESRFRenal disease     Musculoskeletal   Abdominal   Peds  Hematology   Anesthesia Other Findings   Reproductive/Obstetrics                         Anesthesia Physical Anesthesia Plan  ASA: III  Anesthesia Plan: General   Post-op Pain Management:    Induction: Intravenous  Airway Management Planned: Oral ETT  Additional Equipment:   Intra-op Plan:   Post-operative Plan: Extubation in OR  Informed Consent: I have reviewed the patients History and Physical, chart, labs and discussed the procedure including the risks, benefits and alternatives for the proposed anesthesia with the patient or authorized representative who has indicated his/her understanding and acceptance.     Plan Discussed with: CRNA and Surgeon  Anesthesia Plan Comments:         Anesthesia Quick Evaluation

## 2012-04-08 NOTE — Op Note (Signed)
Pre Operative Diagnosis:  ESRD  Post Operative Diagnosis: same  Procedure: lap peritoneal dialysis catheter  Surgeon: Dr. Axel Filler  Assistant: none  Anesthesia: GETA  EBL: <5cc  Complications: none  Counts: reported as correct x 2  Findings:  The patient is on adhesions to the anterior abdominal  Indications for procedure:  The patient had a small amount of omental adhesion to the anterior abd wall.  She also had what appeared to me a midline hernia at the previous c-section site.  This was left in place and did not obstruct the catheter in any way.  500cc of saline were instilled into the catheter in the OR and drained well via the catheter, in a DPL-style.  Details of the procedure:  The patient was taken back to the operating room. The patient was placed in supine position with bilateral SCDs in place. After appropriate anitbiotics were confirmed, a time-out was confirmed and all facts were verified.  A veress needle technique was used to insuflate the abdomen at Palmer's point. Once pneumoperitoneum reached a 14mm of mercury a 5mm trocar and 5mm camera and placed intra-abdominally. A right upper quadrant 5 mm port was then placed under direct visualization as was a left lower quadrant 10 mm port. I noticed  omental adhesions to the anterior abdominal wall.  These were sharply taken down with scissors and Bovie cautery. There was also omental adhesions to anterior abdominal wall near the previous C-section scar was in the hernia area. This was left in place. A peritoneal dialysis as her catheter was then brought into the abdomen via the 10 mm port. It was placemed in the pouch of [place].  At this time 500 cc sterile saline was instilled into the abdomen, and then drained via gravity via the catheter similar to a DPL-style.  The saline easily drained. A stab incision was made in the left lower quadrant. The catheter was tunneled to this area. At this time the distal cuff was brought  up to superficial to the peritoneum. The catheter was anchored to the abdominal wall using 2-0 nylon.  The pneumoperitoneum was evacuated, and all ports removed. All port sites were reapproximated using 3-0 Monocryl in subcuticular fashion. They were dressed with Steri-Strips, gauze, and tape.  The patient was taken to the recovery room  in stable condition.

## 2012-04-08 NOTE — Anesthesia Postprocedure Evaluation (Signed)
Anesthesia Post Note  Patient: Lori Greer  Procedure(s) Performed: Procedure(s) (LRB): LAPAROSCOPIC INSERTION CONTINUOUS AMBULATORY PERITONEAL DIALYSIS  (CAPD) CATHETER (Left)  Anesthesia type: general  Patient location: PACU  Post pain: Pain level controlled  Post assessment: Patient's Cardiovascular Status Stable  Last Vitals:  Filed Vitals:   04/08/12 1224  BP: 145/78  Pulse: 48  Temp: 36.3 C  Resp: 16    Post vital signs: Reviewed and stable  Level of consciousness: sedated  Complications: No apparent anesthesia complications

## 2012-04-09 ENCOUNTER — Encounter (HOSPITAL_COMMUNITY): Payer: Self-pay | Admitting: General Surgery

## 2012-04-10 ENCOUNTER — Encounter: Payer: Self-pay | Admitting: Cardiology

## 2012-04-10 ENCOUNTER — Ambulatory Visit (INDEPENDENT_AMBULATORY_CARE_PROVIDER_SITE_OTHER): Payer: Medicaid Other | Admitting: Cardiology

## 2012-04-10 VITALS — BP 134/83 | HR 60 | Wt 166.0 lb

## 2012-04-10 DIAGNOSIS — I2510 Atherosclerotic heart disease of native coronary artery without angina pectoris: Secondary | ICD-10-CM

## 2012-04-10 DIAGNOSIS — I10 Essential (primary) hypertension: Secondary | ICD-10-CM

## 2012-04-10 MED ORDER — METOPROLOL SUCCINATE ER 50 MG PO TB24: 50.0000 mg | ORAL_TABLET | Freq: Every day | ORAL | Status: DC

## 2012-04-10 NOTE — Assessment & Plan Note (Signed)
Continue aspirin and statin. Continue Plavix for one year following stent placement.

## 2012-04-10 NOTE — Patient Instructions (Addendum)
Dr. Jens Som would like you to STOP ATENOLOL AND START TOPROL 50MG  1 TAB DAILY DR. Jens Som WILL SEE YOU BACK IN 6 MONTHS

## 2012-04-10 NOTE — Assessment & Plan Note (Signed)
Blood pressure is controlled. Given renal failure I will discontinue her atenolol and instead treat with Toprol 50 mg daily.

## 2012-04-10 NOTE — Progress Notes (Signed)
HPI: Pleasant female for fu of CAD. Echocardiogram in November of 2013 showed normal LV function, moderate left ventricular hypertrophy, mild biatrial enlargement and trace tricuspid regurgitation. Carotid Dopplers in November of 2013 were normal. Outside records were reviewed and a nuclear study in August of 2013 from [place] showed mild to moderate anterior ischemia. Ejection fraction was 47%. Cardiac catheterization in January of 2014 showed a 90% mid LAD. There was a 30% lesion in the lateral branch of a large ramus. There was a 95% posterior lateral branch. It was small. The patient had PCI of her LAD with a drug-eluting stent. Since she was last seen, the patient denies any dyspnea on exertion, orthopnea, PND, pedal edema, palpitations, syncope or chest pain.    Current Outpatient Prescriptions  Medication Sig Dispense Refill  . ACCU-CHEK FASTCLIX LANCETS MISC 1 each by Does not apply route daily.  100 each  3  . amLODipine (NORVASC) 10 MG tablet Take 10 mg by mouth daily.      [name] aspirin EC 81 MG EC tablet Take 1 tablet (81 mg total) by mouth daily.      [name] atenolol (TENORMIN) 50 MG tablet Take 50 mg by mouth at bedtime.      . clopidogrel (PLAVIX) 75 MG tablet Take 1 tablet (75 mg total) by mouth daily with breakfast.  30 tablet  11  . folic acid-vitamin b complex-vitamin c-selenium-zinc (DIALYVITE) 3 MG TABS Take 1 tablet by mouth daily.      [name] glucose blood (ACCU-CHEK ACTIVE STRIPS) test strip Use as instructed  100 each  12  . HYDROcodone-acetaminophen (NORCO/VICODIN) 5-325 MG per tablet Take 1 tablet by mouth every 6 (six) hours as needed for pain.  50 tablet  0  . insulin aspart (NOVOLOG) 100 UNIT/ML injection Inject 6 Units into the skin as needed.       . nitroGLYCERIN (NITRODUR - DOSED IN MG/24 HR) 0.4 mg/hr Place 1 patch onto the skin daily as needed. For chest pain      . oxyCODONE-acetaminophen (PERCOCET) 10-325 MG per tablet Take 1 tablet by mouth every 4 (four) hours as needed  for pain.  30 tablet  0  . pravastatin (PRAVACHOL) 40 MG tablet Take 40 mg by mouth every evening.      . sevelamer (RENVELA) 800 MG tablet Take 1,600 mg by mouth 3 (three) times daily with meals.       No current facility-administered medications for this visit.     Past Medical History  Diagnosis Date  . Bell's palsy 05/12/2008  . DIABETES MELLITUS, TYPE II 03/16/2008  . HYPERTENSION 01/06/2010  . OBESITY 03/16/2008  . Proliferative diabetic retinopathy(362.02) 04/23/2009  . STASIS DERMATITIS 07/06/2009  . Fibroid, uterine   . Murmur   . Chronic renal failure     ESRD on HD  . Anemia   . CAD (coronary artery disease) 01/2012    cardiac cath 02/23/2012 - 2 vessel CAD s/p DES to mid-LAD     Past Surgical History  Procedure Laterality Date  . Appendectomy  2012  . Uterine fibroid surgery    . Abdominal hysterectomy    . Abcess drainage  2002    Right axilla  . Insertion of dialysis catheter  January 2014    Right IJ   . Capd insertion Left 04/08/2012    Procedure: LAPAROSCOPIC INSERTION CONTINUOUS AMBULATORY PERITONEAL DIALYSIS  (CAPD) CATHETER;  Surgeon: Axel Filler, MD;  Location: [facility] OR;  Service: General;  Laterality: Left;  History   Social History  . Marital Status: Married    Spouse Name: N/A    Number of Children: 5  . Years of Education: N/A   Occupational History  . CNA    Social History Main Topics  . Smoking status: Former Smoker    Types: Cigarettes    Quit date: 03/12/2004  . Smokeless tobacco: Never Used  . Alcohol Use: Yes     Comment: rarely  . Drug Use: No  . Sexually Active: Not Currently   Other Topics Concern  . Not on file   Social History Narrative   5 Children          ROS: no fevers or chills, productive cough, hemoptysis, dysphasia, odynophagia, melena, hematochezia, dysuria, hematuria, rash, seizure activity, orthopnea, PND, pedal edema, claudication. Remaining systems are negative.  Physical Exam: Well-developed  well-nourished in no acute distress.  Skin is warm and dry.  HEENT is normal.  Neck is supple.  Chest is clear to auscultation with normal expansion.  Cardiovascular exam is regular rate and rhythm.  Abdominal exam nontender or distended. No masses palpated. Status post recent peritoneal dialysis catheter access placement. Extremities show no edema. neuro grossly intact

## 2012-04-16 ENCOUNTER — Telehealth (INDEPENDENT_AMBULATORY_CARE_PROVIDER_SITE_OTHER): Payer: Self-pay | Admitting: *Deleted

## 2012-04-16 NOTE — Telephone Encounter (Signed)
Patient called to ask for refill of pain medication.  Explained to patient that this RN could call in Norco to try to wean patient down on amount.  Patient agreeable at this time.  Norco 5/325mg  1 tabs q4-6 hours as needed for pain #30 no refills. Called into [facility] on [address] [phone].

## 2012-04-18 ENCOUNTER — Encounter (INDEPENDENT_AMBULATORY_CARE_PROVIDER_SITE_OTHER): Payer: Self-pay | Admitting: General Surgery

## 2012-04-18 ENCOUNTER — Ambulatory Visit (INDEPENDENT_AMBULATORY_CARE_PROVIDER_SITE_OTHER): Payer: Medicaid Other | Admitting: General Surgery

## 2012-04-18 VITALS — BP 122/72 | HR 70 | Temp 97.8°F | Resp 16 | Ht 62.0 in | Wt 161.4 lb

## 2012-04-18 DIAGNOSIS — Z4902 Encounter for fitting and adjustment of peritoneal dialysis catheter: Secondary | ICD-10-CM

## 2012-04-18 NOTE — Progress Notes (Signed)
4Patient ID: Lori Greer, female   DOB: 1970/12/28, 42 y.o.   MRN: [id number] This patient is a 42 year old female status post laparoscopic insertion of a peritoneal dialysis catheter. The patient has been doing well postoperatively without any pain. She has not had this peritoneal dialysis catheter first at this time.the patient states she is moving to [place] this weekend.  She has follow up with her hemodialysis clinic, [place]. I encouraged her to discuss with him follow up for peritoneal dialysis in [place], and/or have Dr. Roanna Banning office help with say that up.  On exam: Her wounds are clean dry and intact The patient has no abdominal pain  Assessment and plan: 42 year old female with end-stage renal disease, status post peritoneal dialysis catheter insertion. 1. I discussed with the patient and need for followup for peritoneal dialysis catheter and flushing either here when she moved to [place]. She will need a contact for a perineal dialysis catheter so she can begin this in the next 2-3 weeks. 2.the patient follow up with [place] as needed.

## 2012-04-22 ENCOUNTER — Encounter (INDEPENDENT_AMBULATORY_CARE_PROVIDER_SITE_OTHER): Payer: Medicaid Other | Admitting: General Surgery

## 2012-06-18 ENCOUNTER — Telehealth: Payer: Self-pay | Admitting: Cardiology

## 2012-06-18 ENCOUNTER — Encounter (INDEPENDENT_AMBULATORY_CARE_PROVIDER_SITE_OTHER): Payer: Self-pay

## 2012-06-18 NOTE — Telephone Encounter (Signed)
Left message for pt, we do not get samples of plavix.

## 2012-06-18 NOTE — Telephone Encounter (Signed)
New problem    Pt calling for samples of plavix

## 2012-06-20 ENCOUNTER — Emergency Department (HOSPITAL_COMMUNITY)
Admission: EM | Admit: 2012-06-20 | Discharge: 2012-06-21 | Disposition: A | Discharge status: Home / Self Care | Payer: Medicare Other | Attending: Emergency Medicine | Admitting: Emergency Medicine

## 2012-06-20 ENCOUNTER — Encounter (HOSPITAL_COMMUNITY): Payer: Self-pay | Admitting: Nurse Practitioner

## 2012-06-20 DIAGNOSIS — N186 End stage renal disease: Secondary | ICD-10-CM | POA: Insufficient documentation

## 2012-06-20 DIAGNOSIS — Z992 Dependence on renal dialysis: Secondary | ICD-10-CM | POA: Insufficient documentation

## 2012-06-20 DIAGNOSIS — Z8742 Personal history of other diseases of the female genital tract: Secondary | ICD-10-CM | POA: Insufficient documentation

## 2012-06-20 DIAGNOSIS — I2510 Atherosclerotic heart disease of native coronary artery without angina pectoris: Secondary | ICD-10-CM | POA: Insufficient documentation

## 2012-06-20 DIAGNOSIS — Z7982 Long term (current) use of aspirin: Secondary | ICD-10-CM | POA: Insufficient documentation

## 2012-06-20 DIAGNOSIS — Z8669 Personal history of other diseases of the nervous system and sense organs: Secondary | ICD-10-CM | POA: Insufficient documentation

## 2012-06-20 DIAGNOSIS — Z8679 Personal history of other diseases of the circulatory system: Secondary | ICD-10-CM | POA: Insufficient documentation

## 2012-06-20 DIAGNOSIS — Y838 Other surgical procedures as the cause of abnormal reaction of the patient, or of later complication, without mention of misadventure at the time of the procedure: Secondary | ICD-10-CM | POA: Insufficient documentation

## 2012-06-20 DIAGNOSIS — Z79899 Other long term (current) drug therapy: Secondary | ICD-10-CM | POA: Insufficient documentation

## 2012-06-20 DIAGNOSIS — T85611A Breakdown (mechanical) of intraperitoneal dialysis catheter, initial encounter: Secondary | ICD-10-CM

## 2012-06-20 DIAGNOSIS — T85691A Other mechanical complication of intraperitoneal dialysis catheter, initial encounter: Secondary | ICD-10-CM | POA: Insufficient documentation

## 2012-06-20 DIAGNOSIS — D649 Anemia, unspecified: Secondary | ICD-10-CM | POA: Insufficient documentation

## 2012-06-20 DIAGNOSIS — Z7902 Long term (current) use of antithrombotics/antiplatelets: Secondary | ICD-10-CM | POA: Insufficient documentation

## 2012-06-20 DIAGNOSIS — R011 Cardiac murmur, unspecified: Secondary | ICD-10-CM | POA: Insufficient documentation

## 2012-06-20 DIAGNOSIS — Z87891 Personal history of nicotine dependence: Secondary | ICD-10-CM | POA: Insufficient documentation

## 2012-06-20 DIAGNOSIS — E669 Obesity, unspecified: Secondary | ICD-10-CM | POA: Insufficient documentation

## 2012-06-20 DIAGNOSIS — E119 Type 2 diabetes mellitus without complications: Secondary | ICD-10-CM | POA: Insufficient documentation

## 2012-06-20 DIAGNOSIS — E11359 Type 2 diabetes mellitus with proliferative diabetic retinopathy without macular edema: Secondary | ICD-10-CM | POA: Insufficient documentation

## 2012-06-20 DIAGNOSIS — I120 Hypertensive chronic kidney disease with stage 5 chronic kidney disease or end stage renal disease: Secondary | ICD-10-CM | POA: Insufficient documentation

## 2012-06-20 LAB — CBC WITH DIFFERENTIAL/PLATELET
Basophils Absolute: 0 10*3/uL (ref 0.0–0.1)
Basophils Relative: 0 % (ref 0–1)
Eosinophils Absolute: 0.3 10*3/uL (ref 0.0–0.7)
Eosinophils Relative: 4 % (ref 0–5)
HCT: 27 % — ABNORMAL LOW (ref 36.0–46.0)
Hemoglobin: 9.6 g/dL — ABNORMAL LOW (ref 12.0–15.0)
Lymphocytes Relative: 36 % (ref 12–46)
Lymphs Abs: 2.7 10*3/uL (ref 0.7–4.0)
MCH: 31 pg (ref 26.0–34.0)
MCHC: 35.6 g/dL (ref 30.0–36.0)
MCV: 87.1 fL (ref 78.0–100.0)
Monocytes Absolute: 0.6 10*3/uL (ref 0.1–1.0)
Monocytes Relative: 7 % (ref 3–12)
Neutro Abs: 4 10*3/uL (ref 1.7–7.7)
Neutrophils Relative %: 53 % (ref 43–77)
Platelets: 215 10*3/uL (ref 150–400)
RBC: 3.1 MIL/uL — ABNORMAL LOW (ref 3.87–5.11)
RDW: 14.7 % (ref 11.5–15.5)
WBC: 7.5 10*3/uL (ref 4.0–10.5)

## 2012-06-20 LAB — PROTIME-INR
INR: 0.96 (ref 0.00–1.49)
Prothrombin Time: 12.7 seconds (ref 11.6–15.2)

## 2012-06-20 LAB — APTT: aPTT: 26 seconds (ref 24–37)

## 2012-06-20 MED ORDER — VANCOMYCIN 100 MG/ML FOR DIALYSIS
1200.0000 mg | INJECTION | Freq: Once | Status: AC
  Administered 2012-06-21: 1200 mg via INTRAPERITONEAL
  Filled 2012-06-20: qty 12

## 2012-06-20 MED ORDER — CEFTAZIDIME 200 MG/ML FOR DIALYSIS
2000.0000 mg | INJECTION | Freq: Once | INTRAVENOUS_CENTRAL | Status: AC
  Administered 2012-06-21: 2000 mg via INTRAPERITONEAL
  Filled 2012-06-20: qty 10

## 2012-06-20 NOTE — ED Notes (Signed)
Spoke with RN on [id number], will come down to assist with PD cath removal of fluid and administering meds.

## 2012-06-20 NOTE — ED Provider Notes (Signed)
History     CSN: [id number]  Arrival date & time 06/20/12  2118   First MD Initiated Contact with Patient 06/20/12 2240      Chief Complaint  Patient presents with  . Vascular Access Problem    (Consider location/radiation/quality/duration/timing/severity/associated sxs/prior treatment) HPI  Patient is a 42 year old female past medical history significant for DM, HTN, ESRD on home peritoneal dialysis presenting to ED for two liters pink toned fluid draining during her home dialysis this evening around 8:45 PM. Pt states this has never happened before. Patient states she just recently had tubes placed one month ago and denies any issues since placement. She denies any recent trauma. Denies fevers, chills, nausea, vomiting, abdominal pain, diarrhea, CP, SOB. Patient is followed by Dr. Lowell Guitar her nephrologist.   Past Medical History  Diagnosis Date  . Bell's palsy 05/12/2008  . DIABETES MELLITUS, TYPE II 03/16/2008  . HYPERTENSION 01/06/2010  . OBESITY 03/16/2008  . Proliferative diabetic retinopathy(362.02) 04/23/2009  . STASIS DERMATITIS 07/06/2009  . Fibroid, uterine   . Murmur   . Chronic renal failure     ESRD on HD  . Anemia   . CAD (coronary artery disease) 01/2012    cardiac cath 02/23/2012 - 2 vessel CAD s/p DES to mid-LAD     Past Surgical History  Procedure Laterality Date  . Appendectomy  2012  . Uterine fibroid surgery    . Abdominal hysterectomy    . Abcess drainage  2002    Right axilla  . Insertion of dialysis catheter  January 2014    Right IJ   . Capd insertion Left 04/08/2012    Procedure: LAPAROSCOPIC INSERTION CONTINUOUS AMBULATORY PERITONEAL DIALYSIS  (CAPD) CATHETER;  Surgeon: Axel Filler, MD;  Location: [facility] OR;  Service: General;  Laterality: Left;    Family History  Problem Relation Age of Onset  . Heart disease Mother     MI at age 62  . Diabetes Father   . Kidney failure Father   . Diabetes Sister     1 sister with diabtes  . Diabetes  Brother     History  Substance Use Topics  . Smoking status: Former Smoker    Types: Cigarettes    Quit date: 03/12/2004  . Smokeless tobacco: Never Used  . Alcohol Use: Yes     Comment: rarely    OB History   Grav Para Term Preterm Abortions TAB SAB Ect Mult Living                  Review of Systems  Constitutional: Negative for fever and chills.  HENT: Negative for neck pain.   Eyes: Negative for visual disturbance.  Respiratory: Negative for shortness of breath.   Cardiovascular: Negative for chest pain.  Gastrointestinal: Negative for nausea, vomiting, abdominal pain and diarrhea.  Genitourinary: Negative for flank pain and menstrual problem.  Musculoskeletal: Negative for back pain.  Skin: Negative.   Neurological: Negative for light-headedness and headaches.    Allergies  Review of patient's allergies indicates no known allergies.  Home Medications   Current Outpatient Rx  Name  Route  Sig  Dispense  Refill  . aspirin EC 81 MG EC tablet   Oral   Take 1 tablet (81 mg total) by mouth daily.         [name] atenolol (TENORMIN) 50 MG tablet   Oral   Take 50 mg by mouth daily.         . bisacodyl (DULCOLAX) 5 MG  EC tablet   Oral   Take 5 mg by mouth daily as needed for constipation.         . clopidogrel (PLAVIX) 75 MG tablet   Oral   Take 1 tablet (75 mg total) by mouth daily with breakfast.   30 tablet   11   . Epoetin Alfa (EPOGEN IJ)   Injection   Inject 7,000 Units as directed once a week. On mondays         . folic acid-vitamin b complex-vitamin c-selenium-zinc (DIALYVITE) 3 MG TABS   Oral   Take 1 tablet by mouth daily.         . metoprolol succinate (TOPROL-XL) 50 MG 24 hr tablet   Oral   Take 1 tablet (50 mg total) by mouth daily. Take with or immediately following a meal.   30 tablet   12   . potassium chloride SA (K-DUR,KLOR-CON) 20 MEQ tablet   Oral   Take 20 mEq by mouth daily.         . pravastatin (PRAVACHOL) 40 MG  tablet   Oral   Take 40 mg by mouth every evening.         . sevelamer (RENVELA) 800 MG tablet   Oral   Take 1,600 mg by mouth 3 (three) times daily with meals. Takes 1600mg  with each meal, and 800mg  with each snack.           BP 115/71  Pulse 89  Temp(Src) 98.5 F (36.9 C) (Oral)  Resp 16  Ht 5\' 2"  (1.575 m)  Wt 160 lb (72.576 kg)  BMI 29.26 kg/m2  SpO2 99%  Physical Exam  Constitutional: She is oriented to person, place, and time. She appears well-developed and well-nourished.  HENT:  Head: Normocephalic and atraumatic.  Eyes: Conjunctivae are normal.  Cardiovascular: Normal rate, regular rhythm and normal heart sounds.   Pulmonary/Chest: Effort normal and breath sounds normal.  Abdominal: Soft. Bowel sounds are normal.  Neurological: She is alert and oriented to person, place, and time.  Skin: Skin is warm and dry.  Psychiatric: She has a normal mood and affect.    ED Course  Procedures (including critical care time)  Nephrology consulted.   During exchange - initial peritoneal drainage color light pink, with second peritoneal drainage after washout clear w/ a hint of pink. On last exchange peritoneal drainage is even lighter.   Labs Reviewed  CBC WITH DIFFERENTIAL - Abnormal; Notable for the following:    RBC 3.10 (*)    Hemoglobin 9.6 (*)    HCT 27.0 (*)    All other components within normal limits  LACTATE DEHYDROGENASE, BODY FLUID - Abnormal; Notable for the following:    LD, Fluid 26 (*)    All other components within normal limits  BODY FLUID CELL COUNT WITH DIFFERENTIAL - Abnormal; Notable for the following:    Color, Fluid PINK (*)    Appearance, Fluid CLOUDY (*)    All other components within normal limits  POTASSIUM - Abnormal; Notable for the following:    Potassium 2.9 (*)    All other components within normal limits  CBC - Abnormal; Notable for the following:    RBC 2.91 (*)    Hemoglobin 8.8 (*)    HCT 25.3 (*)    All other components  within normal limits  POCT I-STAT, CHEM 8 - Abnormal; Notable for the following:    Potassium 2.8 (*)    BUN 73 (*)  Creatinine, Ser 8.50 (*)    Hemoglobin 17.7 (*)    HCT 52.0 (*)    All other components within normal limits  BODY FLUID CULTURE  PROTIME-INR  APTT  PROTEIN, BODY FLUID  ALBUMIN, FLUID  GLUCOSE, PERITONEAL FLUID   No results found.   1. Peritoneal dialysis catheter dysfunction, initial encounter       MDM  Pt presents for peritoneal dialysis catheter dysfunction after placement one month ago. Pt with no other physical complaints at this time. Physical exam wnl. VSS. Labs reviewed. Potassium replaced. Nephrology was consulted regarding catheter dysfunction. Pt underwent peritoneal dialysis exchange. The fluid removed was light pink in nature and the color continued to lighten further along in the exchange process. Nephrology will follow up with patient in the morning. Pt was still undergoing exchange at shift change. Patient care was shifted to Dr. Nicanor Alcon. Patient d/w with Dr. Nicanor Alcon, agrees with plan. Stable at time of shift change.          Jeannetta Ellis, PA-C 06/21/12 [phone]

## 2012-06-20 NOTE — ED Notes (Signed)
Per EMS pts has peritoneal dialysis set up at home- put in 2,000 took out 2,200 in dialysis- with pink tone. Pt has never had blood in peritoneal drainage residual.

## 2012-06-21 LAB — ALBUMIN, FLUID (OTHER): Albumin, Fluid: 0.2 g/dL

## 2012-06-21 LAB — LACTATE DEHYDROGENASE, PLEURAL OR PERITONEAL FLUID: LD, Fluid: 26 U/L — ABNORMAL HIGH (ref 3–23)

## 2012-06-21 LAB — POCT I-STAT, CHEM 8
BUN: 73 mg/dL — ABNORMAL HIGH (ref 6–23)
Calcium, Ion: 1.19 mmol/L (ref 1.12–1.23)
Chloride: 101 mEq/L (ref 96–112)
Creatinine, Ser: 8.5 mg/dL — ABNORMAL HIGH (ref 0.50–1.10)
Glucose, Bld: 85 mg/dL (ref 70–99)
HCT: 52 % — ABNORMAL HIGH (ref 36.0–46.0)
Hemoglobin: 17.7 g/dL — ABNORMAL HIGH (ref 12.0–15.0)
Potassium: 2.8 mEq/L — ABNORMAL LOW (ref 3.5–5.1)
Sodium: 136 mEq/L (ref 135–145)
TCO2: 29 mmol/L (ref 0–100)

## 2012-06-21 LAB — PATHOLOGIST SMEAR REVIEW: Path Review: REACTIVE

## 2012-06-21 LAB — BODY FLUID CELL COUNT WITH DIFFERENTIAL
Eos, Fluid: 1 %
Lymphs, Fluid: 13 %
Monocyte-Macrophage-Serous Fluid: 81 % (ref 50–90)
Neutrophil Count, Fluid: 5 % (ref 0–25)
Total Nucleated Cell Count, Fluid: 86 cu mm (ref 0–1000)

## 2012-06-21 LAB — CBC
HCT: 25.3 % — ABNORMAL LOW (ref 36.0–46.0)
Hemoglobin: 8.8 g/dL — ABNORMAL LOW (ref 12.0–15.0)
MCH: 30.2 pg (ref 26.0–34.0)
MCHC: 34.8 g/dL (ref 30.0–36.0)
MCV: 86.9 fL (ref 78.0–100.0)
Platelets: 213 10*3/uL (ref 150–400)
RBC: 2.91 MIL/uL — ABNORMAL LOW (ref 3.87–5.11)
RDW: 14.8 % (ref 11.5–15.5)
WBC: 7.8 10*3/uL (ref 4.0–10.5)

## 2012-06-21 LAB — POTASSIUM: Potassium: 2.9 mEq/L — ABNORMAL LOW (ref 3.5–5.1)

## 2012-06-21 LAB — PROTEIN, BODY FLUID: Total protein, fluid: 0.4 g/dL

## 2012-06-21 MED ORDER — POTASSIUM CHLORIDE CRYS ER 20 MEQ PO TBCR: 40.0000 meq | EXTENDED_RELEASE_TABLET | Freq: Once | ORAL | Status: DC

## 2012-06-21 MED ORDER — POTASSIUM CHLORIDE CRYS ER 20 MEQ PO TBCR
40.0000 meq | EXTENDED_RELEASE_TABLET | Freq: Once | ORAL | Status: AC
  Administered 2012-06-21: 40 meq via ORAL
  Filled 2012-06-21: qty 2

## 2012-06-21 NOTE — Progress Notes (Signed)
Performed two rapid exchanges after draining 30cc of pink, cloudy fluid. Filled patient with 2L of 1.5% dialysate containing vancomycin and fortaz. Sent initial effluent to lab. Patient tolerated procedure with no discomfort.

## 2012-06-21 NOTE — ED Provider Notes (Signed)
History     CSN: [id number]  Arrival date & time 06/20/12  2118   First MD Initiated Contact with Patient 06/20/12 2240      Chief Complaint  Patient presents with  . Vascular Access Problem    (Consider location/radiation/quality/duration/timing/severity/associated sxs/prior treatment) HPI  Past Medical History  Diagnosis Date  . Bell's palsy 05/12/2008  . DIABETES MELLITUS, TYPE II 03/16/2008  . HYPERTENSION 01/06/2010  . OBESITY 03/16/2008  . Proliferative diabetic retinopathy(362.02) 04/23/2009  . STASIS DERMATITIS 07/06/2009  . Fibroid, uterine   . Murmur   . Chronic renal failure     ESRD on HD  . Anemia   . CAD (coronary artery disease) 01/2012    cardiac cath 02/23/2012 - 2 vessel CAD s/p DES to mid-LAD     Past Surgical History  Procedure Laterality Date  . Appendectomy  2012  . Uterine fibroid surgery    . Abdominal hysterectomy    . Abcess drainage  2002    Right axilla  . Insertion of dialysis catheter  January 2014    Right IJ   . Capd insertion Left 04/08/2012    Procedure: LAPAROSCOPIC INSERTION CONTINUOUS AMBULATORY PERITONEAL DIALYSIS  (CAPD) CATHETER;  Surgeon: Axel Filler, MD;  Location: [facility] OR;  Service: General;  Laterality: Left;    Family History  Problem Relation Age of Onset  . Heart disease Mother     MI at age 26  . Diabetes Father   . Kidney failure Father   . Diabetes Sister     1 sister with diabtes  . Diabetes Brother     History  Substance Use Topics  . Smoking status: Former Smoker    Types: Cigarettes    Quit date: 03/12/2004  . Smokeless tobacco: Never Used  . Alcohol Use: Yes     Comment: rarely    OB History   Grav Para Term Preterm Abortions TAB SAB Ect Mult Living                  Review of Systems  Allergies  Review of patient's allergies indicates no known allergies.  Home Medications   Current Outpatient Rx  Name  Route  Sig  Dispense  Refill  . aspirin EC 81 MG EC tablet   Oral   Take 1 tablet  (81 mg total) by mouth daily.         [name] atenolol (TENORMIN) 50 MG tablet   Oral   Take 50 mg by mouth daily.         . bisacodyl (DULCOLAX) 5 MG EC tablet   Oral   Take 5 mg by mouth daily as needed for constipation.         . clopidogrel (PLAVIX) 75 MG tablet   Oral   Take 1 tablet (75 mg total) by mouth daily with breakfast.   30 tablet   11   . Epoetin Alfa (EPOGEN IJ)   Injection   Inject 7,000 Units as directed once a week. On mondays         . folic acid-vitamin b complex-vitamin c-selenium-zinc (DIALYVITE) 3 MG TABS   Oral   Take 1 tablet by mouth daily.         . metoprolol succinate (TOPROL-XL) 50 MG 24 hr tablet   Oral   Take 1 tablet (50 mg total) by mouth daily. Take with or immediately following a meal.   30 tablet   12   . potassium chloride SA (K-DUR,KLOR-CON)  20 MEQ tablet   Oral   Take 20 mEq by mouth daily.         . pravastatin (PRAVACHOL) 40 MG tablet   Oral   Take 40 mg by mouth every evening.         . sevelamer (RENVELA) 800 MG tablet   Oral   Take 1,600 mg by mouth 3 (three) times daily with meals. Takes 1600mg  with each meal, and 800mg  with each snack.           BP 136/63  Pulse 73  Temp(Src) 98.5 F (36.9 C) (Oral)  Resp 16  Ht 5\' 2"  (1.575 m)  Wt 160 lb (72.576 kg)  BMI 29.26 kg/m2  SpO2 100%  Physical Exam  ED Course  Procedures (including critical care time)  Labs Reviewed  CBC WITH DIFFERENTIAL - Abnormal; Notable for the following:    RBC 3.10 (*)    Hemoglobin 9.6 (*)    HCT 27.0 (*)    All other components within normal limits  LACTATE DEHYDROGENASE, BODY FLUID - Abnormal; Notable for the following:    LD, Fluid 26 (*)    All other components within normal limits  BODY FLUID CELL COUNT WITH DIFFERENTIAL - Abnormal; Notable for the following:    Color, Fluid PINK (*)    Appearance, Fluid CLOUDY (*)    All other components within normal limits  POTASSIUM - Abnormal; Notable for the following:     Potassium 2.9 (*)    All other components within normal limits  CBC - Abnormal; Notable for the following:    RBC 2.91 (*)    Hemoglobin 8.8 (*)    HCT 25.3 (*)    All other components within normal limits  POCT I-STAT, CHEM 8 - Abnormal; Notable for the following:    Potassium 2.8 (*)    BUN 73 (*)    Creatinine, Ser 8.50 (*)    Hemoglobin 17.7 (*)    HCT 52.0 (*)    All other components within normal limits  BODY FLUID CULTURE  PROTIME-INR  APTT  PROTEIN, BODY FLUID  ALBUMIN, FLUID  GLUCOSE, PERITONEAL FLUID   No results found.   1. Peritoneal dialysis catheter dysfunction, initial encounter       MDM  Per Dr. Darrick Penna do 2 rapid exchanges and on 3rd installation instill 1200 mg vancomycin and 2 grams ceftazadime and follow up in office in am  341 cell count and differential read to Dr. Darrick Penna.  Safe for d/c       Camila Norville K Baelyn Doring-Rasch, MD 06/21/12 [phone]

## 2012-06-24 LAB — BODY FLUID CULTURE
Culture: NO GROWTH
Gram Stain: NONE SEEN
Special Requests: NORMAL

## 2012-06-24 NOTE — ED Provider Notes (Signed)
Medical screening examination/treatment/procedure(s) were performed by non-physician practitioner and as supervising physician I was immediately available for consultation/collaboration.  Jasmine Awe, MD 06/24/12 [phone]

## 2012-07-04 ENCOUNTER — Encounter (HOSPITAL_COMMUNITY): Payer: Self-pay | Admitting: *Deleted

## 2012-07-04 ENCOUNTER — Other Ambulatory Visit: Payer: Self-pay | Admitting: Nephrology

## 2012-07-04 ENCOUNTER — Emergency Department (HOSPITAL_COMMUNITY)
Admission: EM | Admit: 2012-07-04 | Discharge: 2012-07-04 | Disposition: A | Discharge status: Home / Self Care | Payer: Medicare Other | Source: Home / Self Care | Attending: Emergency Medicine | Admitting: Emergency Medicine

## 2012-07-04 ENCOUNTER — Ambulatory Visit
Admission: RE | Admit: 2012-07-04 | Discharge: 2012-07-04 | Disposition: A | Discharge status: Home / Self Care | Payer: Medicare Other | Source: Ambulatory Visit | Attending: Nephrology | Admitting: Nephrology

## 2012-07-04 DIAGNOSIS — T85611A Breakdown (mechanical) of intraperitoneal dialysis catheter, initial encounter: Secondary | ICD-10-CM

## 2012-07-04 DIAGNOSIS — T82598A Other mechanical complication of other cardiac and vascular devices and implants, initial encounter: Secondary | ICD-10-CM | POA: Insufficient documentation

## 2012-07-04 DIAGNOSIS — Z79899 Other long term (current) drug therapy: Secondary | ICD-10-CM | POA: Insufficient documentation

## 2012-07-04 DIAGNOSIS — Z87891 Personal history of nicotine dependence: Secondary | ICD-10-CM | POA: Insufficient documentation

## 2012-07-04 DIAGNOSIS — Z992 Dependence on renal dialysis: Secondary | ICD-10-CM | POA: Insufficient documentation

## 2012-07-04 DIAGNOSIS — Z7982 Long term (current) use of aspirin: Secondary | ICD-10-CM | POA: Insufficient documentation

## 2012-07-04 DIAGNOSIS — Z7902 Long term (current) use of antithrombotics/antiplatelets: Secondary | ICD-10-CM | POA: Insufficient documentation

## 2012-07-04 DIAGNOSIS — D649 Anemia, unspecified: Secondary | ICD-10-CM | POA: Insufficient documentation

## 2012-07-04 DIAGNOSIS — E876 Hypokalemia: Secondary | ICD-10-CM | POA: Insufficient documentation

## 2012-07-04 DIAGNOSIS — Y658 Other specified misadventures during surgical and medical care: Secondary | ICD-10-CM | POA: Insufficient documentation

## 2012-07-04 DIAGNOSIS — I120 Hypertensive chronic kidney disease with stage 5 chronic kidney disease or end stage renal disease: Secondary | ICD-10-CM | POA: Insufficient documentation

## 2012-07-04 LAB — BASIC METABOLIC PANEL
BUN: 61 mg/dL — ABNORMAL HIGH (ref 6–23)
CO2: 23 mEq/L (ref 19–32)
Calcium: 8.5 mg/dL (ref 8.4–10.5)
Chloride: 105 mEq/L (ref 96–112)
Creatinine, Ser: 7.67 mg/dL — ABNORMAL HIGH (ref 0.50–1.10)
GFR calc Af Amer: 7 mL/min — ABNORMAL LOW (ref >90)
GFR calc non Af Amer: 6 mL/min — ABNORMAL LOW (ref >90)
Glucose, Bld: 85 mg/dL (ref 70–99)
Potassium: 2.8 mEq/L — ABNORMAL LOW (ref 3.5–5.1)
Sodium: 139 mEq/L (ref 135–145)

## 2012-07-04 LAB — CBC WITH DIFFERENTIAL/PLATELET
Basophils Absolute: 0 10*3/uL (ref 0.0–0.1)
Basophils Relative: 0 % (ref 0–1)
Eosinophils Absolute: 0.3 10*3/uL (ref 0.0–0.7)
Eosinophils Relative: 5 % (ref 0–5)
HCT: 24.1 % — ABNORMAL LOW (ref 36.0–46.0)
Hemoglobin: 8.6 g/dL — ABNORMAL LOW (ref 12.0–15.0)
Lymphocytes Relative: 39 % (ref 12–46)
Lymphs Abs: 2.3 10*3/uL (ref 0.7–4.0)
MCH: 31.4 pg (ref 26.0–34.0)
MCHC: 35.7 g/dL (ref 30.0–36.0)
MCV: 88 fL (ref 78.0–100.0)
Monocytes Absolute: 0.5 10*3/uL (ref 0.1–1.0)
Monocytes Relative: 9 % (ref 3–12)
Neutro Abs: 2.7 10*3/uL (ref 1.7–7.7)
Neutrophils Relative %: 47 % (ref 43–77)
Platelets: 160 10*3/uL (ref 150–400)
RBC: 2.74 MIL/uL — ABNORMAL LOW (ref 3.87–5.11)
RDW: 14.9 % (ref 11.5–15.5)
WBC: 5.8 10*3/uL (ref 4.0–10.5)

## 2012-07-04 NOTE — ED Provider Notes (Signed)
History     CSN: [id number]  Arrival date & time 07/04/12  1753   First MD Initiated Contact with Patient 07/04/12 2117      Chief Complaint  Patient presents with  . Dialysis problem     (Consider location/radiation/quality/duration/timing/severity/associated sxs/prior treatment) Patient is a 42 y.o. female presenting with general illness. The history is provided by the patient.  Illness Location:  Peritoneal dialysis catheter not functioning. Quality:  N/a Severity:  Mild Onset quality:  Sudden Duration:  3 days Timing:  Constant Progression:  Unchanged Chronicity:  New Context:  At rest Relieved by:  Nothing Worsened by:  Nothing Ineffective treatments:  Pt reports has been using laxative at recommendation of home training team with no relief.  Associated symptoms: no abdominal pain, no chest pain, no congestion, no cough, no diarrhea, no fever, no nausea, no rhinorrhea, no shortness of breath and no vomiting     Past Medical History  Diagnosis Date  . Bell's palsy 05/12/2008  . DIABETES MELLITUS, TYPE II 03/16/2008  . HYPERTENSION 01/06/2010  . OBESITY 03/16/2008  . Proliferative diabetic retinopathy(362.02) 04/23/2009  . STASIS DERMATITIS 07/06/2009  . Fibroid, uterine   . Murmur   . Chronic renal failure     ESRD on HD  . Anemia   . CAD (coronary artery disease) 01/2012    cardiac cath 02/23/2012 - 2 vessel CAD s/p DES to mid-LAD     Past Surgical History  Procedure Laterality Date  . Appendectomy  2012  . Uterine fibroid surgery    . Abdominal hysterectomy    . Abcess drainage  2002    Right axilla  . Insertion of dialysis catheter  January 2014    Right IJ   . Capd insertion Left 04/08/2012    Procedure: LAPAROSCOPIC INSERTION CONTINUOUS AMBULATORY PERITONEAL DIALYSIS  (CAPD) CATHETER;  Surgeon: Axel Filler, MD;  Location: [facility] OR;  Service: General;  Laterality: Left;    Family History  Problem Relation Age of Onset  . Heart disease Mother     MI at  age 24  . Diabetes Father   . Kidney failure Father   . Diabetes Sister     1 sister with diabtes  . Diabetes Brother     History  Substance Use Topics  . Smoking status: Former Smoker    Types: Cigarettes    Quit date: 03/12/2004  . Smokeless tobacco: Never Used  . Alcohol Use: Yes     Comment: rarely    OB History   Grav Para Term Preterm Abortions TAB SAB Ect Mult Living                  Review of Systems  Constitutional: Negative for fever and chills.  HENT: Negative for congestion and rhinorrhea.   Respiratory: Negative for cough, chest tightness and shortness of breath.   Cardiovascular: Negative for chest pain and palpitations.  Gastrointestinal: Negative for nausea, vomiting, abdominal pain, diarrhea and constipation.  Neurological: Negative for dizziness and weakness.  All other systems reviewed and are negative.    Allergies  Review of patient's allergies indicates no known allergies.  Home Medications   Current Outpatient Rx  Name  Route  Sig  Dispense  Refill  . aspirin EC 81 MG EC tablet   Oral   Take 1 tablet (81 mg total) by mouth daily.         [name] atenolol (TENORMIN) 50 MG tablet   Oral   Take 50 mg by  mouth daily.         . bisacodyl (DULCOLAX) 5 MG EC tablet   Oral   Take 5 mg by mouth daily as needed for constipation.         . clopidogrel (PLAVIX) 75 MG tablet   Oral   Take 1 tablet (75 mg total) by mouth daily with breakfast.   30 tablet   11   . Epoetin Alfa (EPOGEN IJ)   Injection   Inject 7,000 Units as directed once a week. On mondays         . folic acid-vitamin b complex-vitamin c-selenium-zinc (DIALYVITE) 3 MG TABS   Oral   Take 1 tablet by mouth daily.         . metoprolol succinate (TOPROL-XL) 50 MG 24 hr tablet   Oral   Take 50 mg by mouth daily.         . potassium chloride SA (K-DUR,KLOR-CON) 20 MEQ tablet   Oral   Take 20 mEq by mouth daily.         . sevelamer (RENVELA) 800 MG tablet   Oral    Take 1,600 mg by mouth 3 (three) times daily with meals. Takes 1600mg  with each meal, and 800mg  with each snack.         . pravastatin (PRAVACHOL) 40 MG tablet   Oral   Take 40 mg by mouth every evening.           BP 154/80  Pulse 71  Temp(Src) 98.4 F (36.9 C) (Oral)  Resp 20  Ht 5\' 2"  (1.575 m)  Wt 172 lb (78.019 kg)  BMI 31.45 kg/m2  SpO2 100%  Physical Exam  Nursing note and vitals reviewed. Constitutional: She is oriented to person, place, and time. She appears well-developed and well-nourished. No distress.  HENT:  Head: Normocephalic and atraumatic.  Mouth/Throat: Oropharynx is clear and moist.  Eyes: EOM are normal. Pupils are equal, round, and reactive to light.  Neck: Normal range of motion. Neck supple.  Cardiovascular: Normal rate, regular rhythm and normal heart sounds.  Exam reveals no friction rub.   No murmur heard. Pulmonary/Chest: Effort normal and breath sounds normal. No respiratory distress. She has no wheezes. She has no rales.  Abdominal: Soft. There is no tenderness. There is no rebound and no guarding.  Catheter site clean, dry. No TTP, swelling, erythema, induration or fluctuance.   Musculoskeletal: Normal range of motion. She exhibits no edema and no tenderness.  Lymphadenopathy:    She has no cervical adenopathy.  Neurological: She is alert and oriented to person, place, and time.  Skin: Skin is warm and dry. No rash noted.  Psychiatric: She has a normal mood and affect. Her behavior is normal.    ED Course  Procedures (including critical care time)  Labs Reviewed  CBC WITH DIFFERENTIAL - Abnormal; Notable for the following:    RBC 2.74 (*)    Hemoglobin 8.6 (*)    HCT 24.1 (*)    All other components within normal limits  BASIC METABOLIC PANEL - Abnormal; Notable for the following:    Potassium 2.8 (*)    BUN 61 (*)    Creatinine, Ser 7.67 (*)    GFR calc non Af Amer 6 (*)    GFR calc Af Amer 7 (*)    All other components within  normal limits   Dg Abd 1 View  07/04/2012   *RADIOLOGY REPORT*  Clinical Data: Peritoneal dialysis catheter not functioning well  ABDOMEN - 1 VIEW  Comparison: CT abdomen pelvis of 03/26/2010  Findings: The dialysis catheter enters in the left lower quadrant with the tip coiling in the mid upper abdomen.  The bowel gas pattern is nonspecific.  No opaque calculi are seen.  The bones are unremarkable.  IMPRESSION: Peritoneal dialysis catheter coils in the upper abdomen near the midline.  Nonspecific bowel gas pattern.   Original Report Authenticated By: Dwyane Dee, M.D.     1. Peritoneal dialysis catheter dysfunction, initial encounter       MDM  13:34 PM 42 year old female with history of renal failure on peritoneal dialysis presenting with decreased output from her peritoneal drain and blood-tinged fluid. Patient had abdominal x-ray today which shows a: Wire in her upper abdomen with concern for malposition. She denies abdominal pain, fever, nausea or vomiting. She was last seen on 5/29 with pinkish fluid in her catheter. At this time vancomycin was infused into the catheter and she followed the next morning. She appears well exam with stable vital signs. Will check kidney function and plan discussing with nephrology.   10:50PM: Electrolytes show hypokalemia but otherwise no significant abnormality. Given renal failure on peritoneal dialysis will not replace at this time. Discussed with nephrology attending on call. Patient has appointment in the morning with her home training team regarding further evaluation of her peritoneal dialysis catheter. Nephrology attending recommends followup with this appointment for further discussion of treatment options. No indication for emergent surgical consultation at this time. Patient has been using her laxatives all week at the request of her home training team. Recommendation is to continue this laxative and followup in the morning. This was discussed with the  patient and she voiced understanding. Patient was given return precautions and discharged home in stable condition.     Caren Hazy, MD 07/05/12 [phone]

## 2012-07-04 NOTE — ED Notes (Signed)
Pt in stating she is noting blood in her return dialysis fluid when she her peritoneal dialysis this afternoon, states she did not have a lot of fluid come out and that she was told the catheter is out of position

## 2012-07-05 ENCOUNTER — Encounter (HOSPITAL_COMMUNITY)
Admission: AD | Disposition: A | Discharge status: Home / Self Care | Payer: Self-pay | Source: Ambulatory Visit | Attending: Nephrology

## 2012-07-05 ENCOUNTER — Encounter (HOSPITAL_COMMUNITY): Payer: Self-pay | Admitting: Physician Assistant

## 2012-07-05 ENCOUNTER — Telehealth (INDEPENDENT_AMBULATORY_CARE_PROVIDER_SITE_OTHER): Payer: Self-pay | Admitting: *Deleted

## 2012-07-05 ENCOUNTER — Inpatient Hospital Stay (HOSPITAL_COMMUNITY)
Admission: AD | Admit: 2012-07-05 | Discharge: 2012-07-09 | DRG: 264 | Disposition: A | Discharge status: Home / Self Care | Payer: Medicare Other | Source: Ambulatory Visit | Attending: Nephrology | Admitting: Nephrology

## 2012-07-05 DIAGNOSIS — E876 Hypokalemia: Secondary | ICD-10-CM | POA: Diagnosis not present

## 2012-07-05 DIAGNOSIS — E11359 Type 2 diabetes mellitus with proliferative diabetic retinopathy without macular edema: Secondary | ICD-10-CM | POA: Diagnosis present

## 2012-07-05 DIAGNOSIS — I120 Hypertensive chronic kidney disease with stage 5 chronic kidney disease or end stage renal disease: Secondary | ICD-10-CM | POA: Diagnosis present

## 2012-07-05 DIAGNOSIS — R350 Frequency of micturition: Secondary | ICD-10-CM | POA: Diagnosis present

## 2012-07-05 DIAGNOSIS — Y929 Unspecified place or not applicable: Secondary | ICD-10-CM

## 2012-07-05 DIAGNOSIS — N186 End stage renal disease: Secondary | ICD-10-CM

## 2012-07-05 DIAGNOSIS — Z7982 Long term (current) use of aspirin: Secondary | ICD-10-CM

## 2012-07-05 DIAGNOSIS — Y849 Medical procedure, unspecified as the cause of abnormal reaction of the patient, or of later complication, without mention of misadventure at the time of the procedure: Secondary | ICD-10-CM | POA: Diagnosis present

## 2012-07-05 DIAGNOSIS — T82898A Other specified complication of vascular prosthetic devices, implants and grafts, initial encounter: Principal | ICD-10-CM | POA: Diagnosis present

## 2012-07-05 DIAGNOSIS — T85691A Other mechanical complication of intraperitoneal dialysis catheter, initial encounter: Secondary | ICD-10-CM

## 2012-07-05 DIAGNOSIS — R011 Cardiac murmur, unspecified: Secondary | ICD-10-CM | POA: Diagnosis present

## 2012-07-05 DIAGNOSIS — M949 Disorder of cartilage, unspecified: Secondary | ICD-10-CM | POA: Diagnosis present

## 2012-07-05 DIAGNOSIS — Z9861 Coronary angioplasty status: Secondary | ICD-10-CM

## 2012-07-05 DIAGNOSIS — D649 Anemia, unspecified: Secondary | ICD-10-CM | POA: Diagnosis present

## 2012-07-05 DIAGNOSIS — M899 Disorder of bone, unspecified: Secondary | ICD-10-CM | POA: Diagnosis present

## 2012-07-05 DIAGNOSIS — I2510 Atherosclerotic heart disease of native coronary artery without angina pectoris: Secondary | ICD-10-CM | POA: Diagnosis present

## 2012-07-05 DIAGNOSIS — I10 Essential (primary) hypertension: Secondary | ICD-10-CM

## 2012-07-05 DIAGNOSIS — E669 Obesity, unspecified: Secondary | ICD-10-CM | POA: Diagnosis present

## 2012-07-05 DIAGNOSIS — F17200 Nicotine dependence, unspecified, uncomplicated: Secondary | ICD-10-CM | POA: Diagnosis present

## 2012-07-05 DIAGNOSIS — Z992 Dependence on renal dialysis: Secondary | ICD-10-CM

## 2012-07-05 HISTORY — DX: End stage renal disease: N18.6

## 2012-07-05 SURGERY — REPOSITIONING, CATHETER, CAPD, LAPAROSCOPIC
Anesthesia: General

## 2012-07-05 MED ORDER — LACTULOSE 10 GM/15ML PO SOLN
20.0000 g | Freq: Three times a day (TID) | ORAL | Status: DC
  Administered 2012-07-05 – 2012-07-09 (×10): 20 g via ORAL
  Filled 2012-07-05 (×15): qty 30

## 2012-07-05 MED ORDER — CLOPIDOGREL BISULFATE 75 MG PO TABS
75.0000 mg | ORAL_TABLET | Freq: Every day | ORAL | Status: DC
  Administered 2012-07-05 – 2012-07-07 (×3): 75 mg via ORAL
  Filled 2012-07-05 (×4): qty 1

## 2012-07-05 MED ORDER — DARBEPOETIN ALFA-POLYSORBATE 100 MCG/0.5ML IJ SOLN
100.0000 ug | INTRAMUSCULAR | Status: DC
  Filled 2012-07-05: qty 0.5

## 2012-07-05 MED ORDER — SEVELAMER CARBONATE 800 MG PO TABS
1600.0000 mg | ORAL_TABLET | Freq: Three times a day (TID) | ORAL | Status: DC
  Administered 2012-07-05 – 2012-07-09 (×7): 1600 mg via ORAL
  Filled 2012-07-05 (×14): qty 2

## 2012-07-05 MED ORDER — CEFAZOLIN SODIUM-DEXTROSE 2-3 GM-% IV SOLR
2.0000 g | INTRAVENOUS | Status: AC
  Administered 2012-07-06: 2 g via INTRAVENOUS
  Filled 2012-07-05: qty 50

## 2012-07-05 MED ORDER — CLOPIDOGREL BISULFATE 75 MG PO TABS: 75.0000 mg | ORAL_TABLET | Freq: Every day | ORAL | Status: DC

## 2012-07-05 MED ORDER — METOPROLOL SUCCINATE ER 50 MG PO TB24
50.0000 mg | ORAL_TABLET | Freq: Every day | ORAL | Status: DC
  Administered 2012-07-05 – 2012-07-09 (×4): 50 mg via ORAL
  Filled 2012-07-05 (×6): qty 1

## 2012-07-05 MED ORDER — ATENOLOL 50 MG PO TABS
50.0000 mg | ORAL_TABLET | Freq: Two times a day (BID) | ORAL | Status: DC
  Filled 2012-07-05: qty 1

## 2012-07-05 MED ORDER — SIMVASTATIN 40 MG PO TABS
40.0000 mg | ORAL_TABLET | Freq: Every day | ORAL | Status: DC
  Administered 2012-07-05 – 2012-07-07 (×3): 40 mg via ORAL
  Filled 2012-07-05 (×5): qty 1

## 2012-07-05 MED ORDER — RENA-VITE PO TABS
1.0000 | ORAL_TABLET | Freq: Every day | ORAL | Status: DC
  Administered 2012-07-05 – 2012-07-09 (×4): 1 via ORAL
  Filled 2012-07-05 (×5): qty 1

## 2012-07-05 NOTE — Telephone Encounter (Signed)
Spoke with Dr. Arrie Aran.  Patient dwindling.  Getting admitted.  See if we can reposition the peritoneal dialysis catheter laparoscopically.  See if Dr. Magnus Ivan can do it today & I can help.

## 2012-07-05 NOTE — Telephone Encounter (Signed)
Spoke to Donell Beers MD and Hoxworth MD who both agree that they need to call nephrology to place a temporary access until Dr. Derrell Lolling can figure something out with the patient next week.  Spoke to [place] who states understanding and agreeable with POC at this time.

## 2012-07-05 NOTE — H&P (Signed)
[facility] Renal Consultation Note  Indication for Consultation:  Management of ESRD/hemodialysis; anemia, hypertension/volume and secondary hyperparathyroidism  HPI: Lori Greer is a 42 y.o. female with ESRD on peritoneal dialysis who had decreased output from her peritoneal drain for the last week.  She contacted [facility] on [employer] on Monday 6/9 when she could not do her daytime exchanges, and completed her 8-hour nighttime exchange using Heparin 2 cc/2 L.  She had been instructed to use lactulose 2 tbps every two hours and presented daily to [facility], which tried Heparin and 500-cc flushes, but she has not had any successful exchanges in four days.  X-ray of her abdomen yesterday showed that her peritoneal catheter was coiled in the upper abdomen near the midline.  Home training attempted repositioning today, but was unsuccessful.  She was admitted for laparoscopic repositioning of her catheter, but she ate around noon today and is on Plavix for drug-eluting cardiac stents placed in January, requiring delay of procedure until Monday. The patient states that her abdomen was swollen and tight earlier in the week, but improved with more frequent urination.  She feels well and has no complaints, but will have a temporary catheter placed today for hemodialysis.  Dialysis Orders:  CAPD x 7   EDW 73 kg   3 daytime exchanges (dwell time 4 hrs) and 1 nighttime exchange (dwell time 8 hrs) with fill vol 2 L     Past Medical History  Diagnosis Date  . Bell's palsy 05/12/2008  . DIABETES MELLITUS, TYPE II 03/16/2008  . HYPERTENSION 01/06/2010  . OBESITY 03/16/2008  . Proliferative diabetic retinopathy(362.02) 04/23/2009  . STASIS DERMATITIS 07/06/2009  . Fibroid, uterine   . Murmur   . Chronic renal failure     ESRD on HD  . Anemia   . CAD (coronary artery disease) 01/2012    cardiac cath 02/23/2012 - 2 vessel CAD s/p DES to mid-LAD    Past Surgical History  Procedure  Laterality Date  . Appendectomy  2012  . Uterine fibroid surgery    . Abdominal hysterectomy    . Abcess drainage  2002    Right axilla  . Insertion of dialysis catheter  January 2014    Right IJ   . Capd insertion Left 04/08/2012    Procedure: LAPAROSCOPIC INSERTION CONTINUOUS AMBULATORY PERITONEAL DIALYSIS  (CAPD) CATHETER;  Surgeon: Axel Filler, MD;  Location: [facility] OR;  Service: General;  Laterality: Left;   Family History  Problem Relation Age of Onset  . Heart disease Mother     MI at age 29  . Diabetes Father   . Kidney failure Father   . Diabetes Sister     1 sister with diabtes  . Diabetes Brother    Social History  She still smokes cigarettes, but 1 or 2 a week, and occasionally has a beer, but denies any illicit drug use.  She previously worked as a Lawyer and currently lives with her mother.   No Known Allergies Prior to Admission medications   Medication Sig Start Date End Date Taking? Authorizing Provider  aspirin EC 81 MG EC tablet Take 1 tablet (81 mg total) by mouth daily. 02/24/12   Brooke O Edmisten, PA-C  atenolol (TENORMIN) 50 MG tablet Take 50 mg by mouth daily.    Historical Provider, MD  bisacodyl (DULCOLAX) 5 MG EC tablet Take 5 mg by mouth daily as needed for constipation.    Historical Provider, MD  clopidogrel (PLAVIX) 75  MG tablet Take 1 tablet (75 mg total) by mouth daily with breakfast. 02/24/12   Herby Abraham Edmisten, PA-C  Epoetin Alfa (EPOGEN IJ) Inject 7,000 Units as directed once a week. On mondays    Historical Provider, MD  folic acid-vitamin b complex-vitamin c-selenium-zinc (DIALYVITE) 3 MG TABS Take 1 tablet by mouth daily.    Historical Provider, MD  metoprolol succinate (TOPROL-XL) 50 MG 24 hr tablet Take 50 mg by mouth daily. 04/10/12   Lewayne Bunting, MD  potassium chloride SA (K-DUR,KLOR-CON) 20 MEQ tablet Take 20 mEq by mouth daily.    Historical Provider, MD  pravastatin (PRAVACHOL) 40 MG tablet Take 40 mg by mouth every evening.    Historical  Provider, MD  sevelamer (RENVELA) 800 MG tablet Take 1,600 mg by mouth 3 (three) times daily with meals. Takes 1600mg  with each meal, and 800mg  with each snack.    Historical Provider, MD   Labs:  Results for orders placed during the hospital encounter of 07/04/12 (from the past 48 hour(s))  CBC WITH DIFFERENTIAL     Status: Abnormal   Collection Time    07/04/12  9:49 PM      Result Value Range   WBC 5.8  4.0 - 10.5 K/uL   RBC 2.74 (*) 3.87 - 5.11 MIL/uL   Hemoglobin 8.6 (*) 12.0 - 15.0 g/dL   HCT 16.1 (*) 09.6 - 04.5 %   MCV 88.0  78.0 - 100.0 fL   MCH 31.4  26.0 - 34.0 pg   MCHC 35.7  30.0 - 36.0 g/dL   RDW 40.9  81.1 - 91.4 %   Platelets 160  150 - 400 K/uL   Neutrophils Relative % 47  43 - 77 %   Neutro Abs 2.7  1.7 - 7.7 K/uL   Lymphocytes Relative 39  12 - 46 %   Lymphs Abs 2.3  0.7 - 4.0 K/uL   Monocytes Relative 9  3 - 12 %   Monocytes Absolute 0.5  0.1 - 1.0 K/uL   Eosinophils Relative 5  0 - 5 %   Eosinophils Absolute 0.3  0.0 - 0.7 K/uL   Basophils Relative 0  0 - 1 %   Basophils Absolute 0.0  0.0 - 0.1 K/uL  BASIC METABOLIC PANEL     Status: Abnormal   Collection Time    07/04/12  9:49 PM      Result Value Range   Sodium 139  135 - 145 mEq/L   Potassium 2.8 (*) 3.5 - 5.1 mEq/L   Chloride 105  96 - 112 mEq/L   CO2 23  19 - 32 mEq/L   Glucose, Bld 85  70 - 99 mg/dL   BUN 61 (*) 6 - 23 mg/dL   Creatinine, Ser 7.82 (*) 0.50 - 1.10 mg/dL   Calcium 8.5  8.4 - 95.6 mg/dL   GFR calc non Af Amer 6 (*) >90 mL/min   GFR calc Af Amer 7 (*) >90 mL/min   Comment:            The eGFR has been calculated     using the CKD EPI equation.     This calculation has not been     validated in all clinical     situations.     eGFR's persistently     <90 mL/min signify     possible Chronic Kidney Disease.   Constitutional: negative for chills, fatigue, fevers and sweats Ears, nose, mouth, throat, and face: negative  for earaches, hoarseness, nasal congestion and sore  throat Respiratory: negative for cough, dyspnea on exertion, hemoptysis and sputum Cardiovascular: negative for chest pain, chest pressure/discomfort, dyspnea, orthopnea and palpitations Gastrointestinal: negative for abdominal pain, nausea and vomiting Genitourinary:negative, frequency and volume of urination increased Musculoskeletal:negative for arthralgias, back pain, myalgias and neck pain Neurological: negative for coordination problems, dizziness, headaches, paresthesia and weakness  Physical Exam: Filed Vitals:   07/05/12 1310  BP: 160/82  Pulse: 72  Temp: 98.7 F (37.1 C)  Resp: 18     General appearance: alert, cooperative and no distress Head: Normocephalic, without obvious abnormality, atraumatic Neck: no adenopathy, no carotid bruit, no JVD and supple, symmetrical, trachea midline Resp: clear to auscultation bilaterally Cardio: regular rate and rhythm, S1, S2 normal, no murmur, click, rub or gallop GI: soft, non-tender; bowel sounds normal; no masses,  no organomegaly and PD catheter site clean and dry Extremities: extremities normal, atraumatic, no cyanosis or edema Neurologic: Grossly normal Dialysis Access: PD catheter   Assessment/Plan: 1. Poorly functioning PD catheter - no movement of fluid in or out today (per nurse), x-ray showed catheter in upper abdomen; laparoscopic repositioning on 6/16 per Surgery. 2. CAD - s/p PTCA with drug-eluting stent placed @ LAD by Dr. [place] 02/23/12; on Plavix (last taken yesterday).  Cardiology contacted to bridge until surgery. 3. ESRD -  CAPD, 4 exchanges 7 days a week; temporary catheter placement today for HD today. 4. Hypertension/volume - BP 160/82 on Atenolol 50 mg bid & Metoprolol 50 mg qd; current wt 79.7 kg with EDW 73 kg.  Hold BP meds until post-HD. 5. Anemia  - Hgb 8.6, no ESA or Fe.  Aranesp 100 mcg with HD today. 6. Metabolic bone disease -  Ca 8.5, last P 5.5, iPTH 718 on 6/5; on Renvela 2 with meals, 1 with  snacks. 7. Nutrition - last Alb 3.5, high protein renal diet, vitamin.  Pippa Hanif 07/05/2012, 2:19 PM   Attending Nephrologist: Terrial Rhodes, MD

## 2012-07-05 NOTE — Telephone Encounter (Signed)
Patient admitted.  No one immediately available.  Will see if the patient can be done this admission, otherwise may have to be discharged with outpatient hemodialysis until a more convenient time can happen.

## 2012-07-05 NOTE — Consult Note (Signed)
I have seen and examined the patient and agree with the assessment and plans. Discussed with Dr. Michaell Cowing.  Patient unfortunately ate lunch so surgery can't happen today.  Aerin Delany A. Magnus Ivan  MD, FACS

## 2012-07-05 NOTE — Procedures (Signed)
Attempted right femoral catheter placement.  Unfortunately was not able to place Trialysis catheter over the wire without resistance.  Removed wire.  Site-rite [place] was malfunctioning and not able to visualize vein, nor able to change settings/depth/brightness.  biomed service called and will check out machine.  Will attempt another temp cath tomorrow as there is not an urgent indication for HD tonight.

## 2012-07-05 NOTE — Telephone Encounter (Signed)
Paged and spoke to Dr. Magnus Ivan who is very busy with D[facility] patients and asked for Dr. Michaell Cowing to let him know what he needs from him.  Paged Dr. Michaell Cowing to call me at the nursing office with what he needs me to do.

## 2012-07-05 NOTE — Consult Note (Signed)
Reason for Consult: PD cath is not functioning Referring Physician:  [name] is an 42 y.o. female.  HPI: Pt had problems over weekend with her cath working properly, fluid would go in but not drain well.  Monday 07/01/12 her cath would allow fluid in but was not draining well, she had a good exchange on her 4th exchange.  She thought it was better. On Tuesday 6/10 she could not get it to flow either way. She has not had dialysis since.  She has been seen by OP services and unable to resolve issue.  She was sent to the hospital for evaluation and further work on her PD cath. She has eaten lunch about noon, and is on Plavix.  She looks very good despite no dialysis since 07/01/12.  Film from 07/04/12 shows PD cath coils in upper abdomen. K+ 2.8. H/H is stable and WBC is normal yesterday.PD cath placed 04/08/12 by Dr. Antonieta Pert.   Past Medical History  Diagnosis Date  . Bell's palsy 05/12/2008  . DIABETES MELLITUS, TYPE II 03/16/2008  . HYPERTENSION 01/06/2010  . OBESITY 03/16/2008  . Proliferative diabetic retinopathy(362.02) 04/23/2009  . STASIS DERMATITIS 07/06/2009  . Fibroid, uterine   . Murmur   . Chronic renal failure     ESRD on HD  . Anemia   . CAD (coronary artery disease) 01/2012    cardiac cath 02/23/2012 - 2 vessel CAD s/p DES to mid-LAD     Past Surgical History  Procedure Laterality Date  . Appendectomy  2012  . Uterine fibroid surgery    . Abdominal hysterectomy    . Abcess drainage  2002    Right axilla  . Insertion of dialysis catheter  January 2014    Right IJ   . Capd insertion Left 04/08/2012    Procedure: LAPAROSCOPIC INSERTION CONTINUOUS AMBULATORY PERITONEAL DIALYSIS  (CAPD) CATHETER;  Surgeon: Axel Filler, MD;  Location: [facility] OR;  Service: General;  Laterality: Left;    Family History  Problem Relation Age of Onset  . Heart disease Mother     MI at age 32  . Diabetes Father   . Kidney failure Father   . Diabetes Sister     1 sister with  diabtes  . Diabetes Brother     Social History:  reports that she quit smoking about 8 years ago. Her smoking use included Cigarettes. She smoked 0.00 packs per day. She has never used smokeless tobacco. She reports that  drinks alcohol. She reports that she does not use illicit drugs.  Allergies: No Known Allergies  Medications:  Prior to Admission:  Prescriptions prior to admission  Medication Sig Dispense Refill  . aspirin EC 81 MG EC tablet Take 1 tablet (81 mg total) by mouth daily.      [name] atenolol (TENORMIN) 50 MG tablet Take 50 mg by mouth daily.      . bisacodyl (DULCOLAX) 5 MG EC tablet Take 5 mg by mouth daily as needed for constipation.      . clopidogrel (PLAVIX) 75 MG tablet Take 1 tablet (75 mg total) by mouth daily with breakfast.  30 tablet  11  . Epoetin Alfa (EPOGEN IJ) Inject 7,000 Units as directed once a week. On mondays      . folic acid-vitamin b complex-vitamin c-selenium-zinc (DIALYVITE) 3 MG TABS Take 1 tablet by mouth daily.      . metoprolol succinate (TOPROL-XL) 50 MG 24 hr tablet Take 50 mg by mouth daily.      [name]  potassium chloride SA (K-DUR,KLOR-CON) 20 MEQ tablet Take 20 mEq by mouth daily.      . pravastatin (PRAVACHOL) 40 MG tablet Take 40 mg by mouth every evening.      . sevelamer (RENVELA) 800 MG tablet Take 1,600 mg by mouth 3 (three) times daily with meals. Takes 1600mg  with each meal, and 800mg  with each snack.       Scheduled: . [START ON 07/06/2012]  ceFAZolin (ANCEF) IV  2 g Intravenous On Call to OR   Continuous:  PRN: Anti-infectives   Start     Dose/Rate Route Frequency Ordered Stop   07/06/12 0600  ceFAZolin (ANCEF) IVPB 2 g/50 mL premix    Comments:  Pharmacy may adjust dosing strength, interval, or rate of medication as needed for optimal therapy for the patient  Send with patient on call to the OR.  Anesthesia to complete antibiotic administration <76min prior to incision per [facility].   2 g 100 mL/hr over 30 Minutes Intravenous  On call to O.R. 07/05/12 1448 07/07/12 0559      Results for orders placed during the hospital encounter of 07/04/12 (from the past 48 hour(s))  CBC WITH DIFFERENTIAL     Status: Abnormal   Collection Time    07/04/12  9:49 PM      Result Value Range   WBC 5.8  4.0 - 10.5 K/uL   RBC 2.74 (*) 3.87 - 5.11 MIL/uL   Hemoglobin 8.6 (*) 12.0 - 15.0 g/dL   HCT 16.1 (*) 09.6 - 04.5 %   MCV 88.0  78.0 - 100.0 fL   MCH 31.4  26.0 - 34.0 pg   MCHC 35.7  30.0 - 36.0 g/dL   RDW 40.9  81.1 - 91.4 %   Platelets 160  150 - 400 K/uL   Neutrophils Relative % 47  43 - 77 %   Neutro Abs 2.7  1.7 - 7.7 K/uL   Lymphocytes Relative 39  12 - 46 %   Lymphs Abs 2.3  0.7 - 4.0 K/uL   Monocytes Relative 9  3 - 12 %   Monocytes Absolute 0.5  0.1 - 1.0 K/uL   Eosinophils Relative 5  0 - 5 %   Eosinophils Absolute 0.3  0.0 - 0.7 K/uL   Basophils Relative 0  0 - 1 %   Basophils Absolute 0.0  0.0 - 0.1 K/uL  BASIC METABOLIC PANEL     Status: Abnormal   Collection Time    07/04/12  9:49 PM      Result Value Range   Sodium 139  135 - 145 mEq/L   Potassium 2.8 (*) 3.5 - 5.1 mEq/L   Chloride 105  96 - 112 mEq/L   CO2 23  19 - 32 mEq/L   Glucose, Bld 85  70 - 99 mg/dL   BUN 61 (*) 6 - 23 mg/dL   Creatinine, Ser 7.82 (*) 0.50 - 1.10 mg/dL   Calcium 8.5  8.4 - 95.6 mg/dL   GFR calc non Af Amer 6 (*) >90 mL/min   GFR calc Af Amer 7 (*) >90 mL/min   Comment:            The eGFR has been calculated     using the CKD EPI equation.     This calculation has not been     validated in all clinical     situations.     eGFR's persistently     <90 mL/min signify  possible Chronic Kidney Disease.    Dg Abd 1 View  07/04/2012   *RADIOLOGY REPORT*  Clinical Data: Peritoneal dialysis catheter not functioning well  ABDOMEN - 1 VIEW  Comparison: CT abdomen pelvis of 03/26/2010  Findings: The dialysis catheter enters in the left lower quadrant with the tip coiling in the mid upper abdomen.  The bowel gas pattern is  nonspecific.  No opaque calculi are seen.  The bones are unremarkable.  IMPRESSION: Peritoneal dialysis catheter coils in the upper abdomen near the midline.  Nonspecific bowel gas pattern.   Original Report Authenticated By: Dwyane Dee, M.D.    Review of Systems  Constitutional: Negative for fever, chills and weight loss.       Weight is up 71 kg is dry, and now up to 80 kg.  HENT: Negative.   Eyes: Positive for blurred vision. Double vision: She has cataracts and is to have them done.  Respiratory: Negative.   Cardiovascular: Positive for orthopnea (she has this at times). Negative for claudication, leg swelling and PND.  Gastrointestinal: Positive for constipation.  Genitourinary: Negative.        Voiding more now that she has not had PD  Musculoskeletal: Negative.   Skin: Negative.  Negative for itching and rash.  Neurological: Negative.   Psychiatric/Behavioral: Negative.    Blood pressure 160/82, pulse 72, temperature 98.7 F (37.1 C), temperature source Oral, resp. rate 18, height 5\' 2"  (1.575 m), weight 79.7 kg (175 lb 11.3 oz), SpO2 100.00%. Physical Exam  Constitutional: She is oriented to person, place, and time. She appears well-developed and well-nourished. No distress.  HENT:  Head: Normocephalic and atraumatic.  Nose: Nose normal.  Eyes: Conjunctivae and EOM are normal. Pupils are equal, round, and reactive to light. Right eye exhibits no discharge. Left eye exhibits no discharge. No scleral icterus.  Neck: Normal range of motion. Neck supple. No JVD present. No tracheal deviation present. No thyromegaly present.  Cardiovascular: Normal rate, regular rhythm, normal heart sounds and intact distal pulses.  Exam reveals no gallop.   No murmur heard. Respiratory: Effort normal and breath sounds normal. No respiratory distress. She has no wheezes. She has no rales. She exhibits no tenderness.  GI: She exhibits distension. She exhibits no mass. There is no tenderness. There is  no rebound (minimal distension) and no guarding.  PD cath site looks fine.  Musculoskeletal: She exhibits no edema and no tenderness.  Lymphadenopathy:    She has no cervical adenopathy.  Neurological: She is alert and oriented to person, place, and time. No cranial nerve deficit.  Skin: Skin is warm and dry. No rash noted. She is not diaphoretic. No erythema.  Psychiatric: She has a normal mood and affect. Her behavior is normal. Judgment and thought content normal.    Assessment/Plan: 1. 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 Hx of Hidradenitis  8. Body mass index is 32.1  Plan:  Dr. Michaell Cowing was going to try and do something with her today, but with her eating at noon and on plavix we will wait till Monday.  She will need to be off Plavix and  bridged with her recent DES cardiac stents.  Will Marlyne Beards PA-C for Dr. Abigail Miyamoto.   Lori Greer 07/05/2012, 2:35 PM

## 2012-07-05 NOTE — Consult Note (Signed)
CARDIOLOGY CONSULT NOTE  Patient ID: Lori Greer, MRN: [id number], DOB/AGE: 1970/12/20 42 y.o. Admit date: 07/05/2012   Date of Consult: 07/05/2012 Primary Physician: Rogelia Boga, MD Primary Cardiologist: Jens Som  Chief Complaint: dialysis catheter not working Reason for Consult: weigh in on stopping Plavix  HPI: [name] is a 42 y/o F with history of CAD s/p recent DES to LAD 01/2012 (residual PL disease) done for abnormal nuclear stress test, HTN, ESRD on PD, DM, stasis dermatitis who presented to [facility] with problems with her peritoneal dialysis catheter. Her prior cardiac history consists of abnormal nuc in [place] 08/2011 with mild-mod ischemia and EF 47%, then echo in 11/2011 howing normal EF, mod LVH, mild biatrial enlargement, tr TR. She subsequently underwent elective cath 01/2012 showing 90% mid LAD that was treated with a Promis stent. There was a 30% lesion in the lateral branch of a large ramus as well as a 95% posterior lateral branch too small for stenting. She has done well since that time from a cardiac standpoint. However, over the past several days, she was having trouble with the flow of exchange in her PD catheter. She has not been able to have dialysis since 6/9. Per notes, surgery is looking to see if they can reposition the peritoneal dialysis catheter laparoscopically. This would require the patient to be off Plavix. This has been held as of today. Prior notes indicate the plan was to continue for one year, until 01/2013. She denies any chest pain, SOB, nausea, vomiting, palpitations, bleeding issues, or syncope. She does have mild LEE. She's gained about 6 pounds. She reports compliance with her meds. Prior to initiation of PD, she dialyzed through R IJ access which is no longer in place.  Past Medical History  Diagnosis Date  . Bell's palsy 05/12/2008  . DIABETES MELLITUS, TYPE II 03/16/2008  . HYPERTENSION 01/06/2010  . OBESITY 03/16/2008  .  Proliferative diabetic retinopathy(362.02) 04/23/2009  . STASIS DERMATITIS 07/06/2009  . Fibroid, uterine   . ESRD (end stage renal disease)     ESRD on HD  . Anemia   . CAD (coronary artery disease) 01/2012    a. Cardiac cath done for abn nuc 02/23/2012 - 2 vessel CAD s/p DES to mid-LAD, residual disease in the PL OM for medical therapy.       Most Recent Cardiac Studies: Cardiac Cath 01/2012 Procedure: Selective Coronary Angiography, PTCA/Stent of mid-distal LAD  Indication: 42 year old white female with history of end-stage renal disease, hypertension, and diabetes. She has class II angina on 2 antianginal agents. Myoview study demonstrates evidence of anterior wall ischemia.  Diagnostic Procedure Details: The right groin was prepped, draped, and anesthetized with 1% lidocaine. Using the modified Seldinger technique, a 5 French sheath was introduced into the right femoral artery. Standard Judkins catheters were used for selective coronary angiography and left ventriculography. Catheter exchanges were performed over a wire. The diagnostic procedure was well-tolerated without immediate complications.  PROCEDURAL FINDINGS  Hemodynamics:  AO 175/73 with a mean of 115 mmHg  LV N/A  Coronary angiography:  Coronary dominance: right  Left mainstem: Normal.  Left anterior descending (LAD): There is a focal 90% stenosis in the mid to distal LAD.  There is a large ramus intermediate branch that trifurcates on the lateral wall. There is a 30% stenosis in the more lateral branch. Otherwise scattered nonobstructive disease.  Left circumflex (LCx): The left circumflex is a small branch without significant disease.  Right coronary artery (RCA): The  right coronary is a dominant vessel. The proximal, mid, distal RCA are normal. There is a 95% stenosis in the posterior lateral branch. This is a small branch less than 1.5 mm in diameter.  Left ventriculography: Not performed  PCI Procedure Note: Following the  diagnostic procedure, the decision was made to proceed with PCI of the LAD. The PL OM branch was felt to be too small for intervention. The LAD lesion correlated well with her nuclear stress test. The sheath was upsized to a 6 [place]. Weight-based bivalirudin was given for anticoagulation. Plavix 600 mg was given orally. Once a therapeutic ACT was achieved, a 6 [place] XB LAD 3.5 guide catheter was inserted. A pro-water coronary guidewire was used to cross the lesion. The lesion was predilated with a 2.0 mm balloon. The lesion was then stented with a 2.5 x 12 mm Promus stent. The stent was postdilated with a 2.5 mm noncompliant balloon. Following PCI, there was 0% residual stenosis and TIMI-3 flow. Final angiography confirmed an excellent result. Femoral hemostasis was achieved with manual compression. The patient tolerated the PCI procedure well. There were no immediate procedural complications. The patient was transferred to the post catheterization recovery area for further monitoring.  PCI Data:  Vessel - LAD/Segment - mid to distal  Percent Stenosis (pre) 90%  TIMI-flow 3  Stent 2.5 x 12 mm Promus  Percent Stenosis (post) 0%  TIMI-flow (post) 3  Final Conclusions:  1. 2 vessel obstructive coronary disease.  2. Successful stenting of the mid to distal LAD with a drug-eluting stent.  Recommendations: Continue medical therapy for the posterior lateral disease. Continue dual antiplatelet therapy for one year.  2D Echo 11/2011 - Left ventricle: The cavity size was normal. Wall thickness was increased in a pattern of moderate LVH. The estimated ejection fraction was 60%. Wall motion was normal; there were no regional wall motion abnormalities. - Mitral valve: Flat closure of mitral valve, but no prolapse. No significant regurgitation. - Left atrium: The atrium was mildly dilated. - Right atrium: The atrium was mildly dilated. - Tricuspid valve: Trivial regurgitation. - Pulmonary arteries: PA  peak pressure: 31mm Hg (S). - Pericardium, extracardiac: A trivial pericardial effusion was identified posterior to the heart.     Surgical History:  Past Surgical History  Procedure Laterality Date  . Appendectomy  2012  . Uterine fibroid surgery    . Abdominal hysterectomy    . Abcess drainage  2002    Right axilla  . Insertion of dialysis catheter  January 2014    Right IJ   . Capd insertion Left 04/08/2012    Procedure: LAPAROSCOPIC INSERTION CONTINUOUS AMBULATORY PERITONEAL DIALYSIS  (CAPD) CATHETER;  Surgeon: Axel Filler, MD;  Location: [facility] OR;  Service: General;  Laterality: Left;     Home Meds: Prior to Admission medications   Medication Sig Start Date End Date Taking? Authorizing Provider  aspirin EC 81 MG EC tablet Take 1 tablet (81 mg total) by mouth daily. 02/24/12   Brooke O Edmisten, PA-C  atenolol (TENORMIN) 50 MG tablet Take 50 mg by mouth daily.    Historical Provider, MD  bisacodyl (DULCOLAX) 5 MG EC tablet Take 5 mg by mouth daily as needed for constipation.    Historical Provider, MD  clopidogrel (PLAVIX) 75 MG tablet Take 1 tablet (75 mg total) by mouth daily with breakfast. 02/24/12   Herby Abraham Edmisten, PA-C  Epoetin Alfa (EPOGEN IJ) Inject 7,000 Units as directed once a week. On mondays  Historical Provider, MD  folic acid-vitamin b complex-vitamin c-selenium-zinc (DIALYVITE) 3 MG TABS Take 1 tablet by mouth daily.    Historical Provider, MD  metoprolol succinate (TOPROL-XL) 50 MG 24 hr tablet Take 50 mg by mouth daily. 04/10/12   Lewayne Bunting, MD  potassium chloride SA (K-DUR,KLOR-CON) 20 MEQ tablet Take 20 mEq by mouth daily.    Historical Provider, MD  pravastatin (PRAVACHOL) 40 MG tablet Take 40 mg by mouth every evening.    Historical Provider, MD  sevelamer (RENVELA) 800 MG tablet Take 1,600 mg by mouth 3 (three) times daily with meals. Takes 1600mg  with each meal, and 800mg  with each snack.    Historical Provider, MD    Inpatient Medications:  .  [START ON 07/06/2012]  ceFAZolin (ANCEF) IV  2 g Intravenous On Call to OR  . clopidogrel  75 mg Oral Daily  . darbepoetin (ARANESP) injection - DIALYSIS  100 mcg Intravenous Q Fri-HD  . lactulose  20 g Oral TID  . metoprolol succinate  50 mg Oral Daily  . multivitamin  1 tablet Oral QHS  . sevelamer carbonate  1,600 mg Oral TID WC  . simvastatin  40 mg Oral q1800      Allergies: No Known Allergies  History   Social History  . Marital Status: Married    Spouse Name: N/A    Number of Children: 5  . Years of Education: N/A   Occupational History  . CNA    Social History Main Topics  . Smoking status: Former Smoker    Types: Cigarettes    Quit date: 03/12/2004  . Smokeless tobacco: Never Used  . Alcohol Use: Yes     Comment: rarely  . Drug Use: No  . Sexually Active: Not Currently   Other Topics Concern  . Not on file   Social History Narrative   5 Children           Family History  Problem Relation Age of Onset  . Heart disease Mother     MI at age 58  . Diabetes Father   . Kidney failure Father   . Diabetes Sister     1 sister with diabtes  . Diabetes Brother      Review of Systems: General: negative for chills, fever, night sweats Cardiovascular: see above. No PND, orthopnea Dermatological: negative for rash Respiratory: negative for cough or wheezing Urologic: negative for hematuria Abdominal: negative for nausea, vomiting, diarrhea, bright red blood per rectum, melena, or hematemesis Neurologic: negative for visual changes, syncope, or dizziness All other systems reviewed and are otherwise negative except as noted above.  Labs:  Lab Results  Component Value Date   WBC 5.8 07/04/2012   HGB 8.6* 07/04/2012   HCT 24.1* 07/04/2012   MCV 88.0 07/04/2012   PLT 160 07/04/2012     Recent Labs Lab 07/04/12 2149  NA 139  K 2.8*  CL 105  CO2 23  BUN 61*  CREATININE 7.67*  CALCIUM 8.5  GLUCOSE 85    Radiology/Studies:  Dg Abd 1 View 07/04/2012    *RADIOLOGY REPORT*  Clinical Data: Peritoneal dialysis catheter not functioning well  ABDOMEN - 1 VIEW  Comparison: CT abdomen pelvis of 03/26/2010  Findings: The dialysis catheter enters in the left lower quadrant with the tip coiling in the mid upper abdomen.  The bowel gas pattern is nonspecific.  No opaque calculi are seen.  The bones are unremarkable.  IMPRESSION: Peritoneal dialysis catheter coils in the upper  abdomen near the midline.  Nonspecific bowel gas pattern.   Original Report Authenticated By: Dwyane Dee, M.D.   EKG: NSR 69pm no acute ST-T changes  Physical Exam: Blood pressure 160/82, pulse 72, temperature 98.7 F (37.1 C), temperature source Oral, resp. rate 18, height 5\' 2"  (1.575 m), weight 175 lb 11.3 oz (79.7 kg), SpO2 100.00%. General: Well developed, well nourished AAF, well appearing in no acute distress. Head: Normocephalic, atraumatic, sclera non-icteric, no xanthomas, nares are without discharge.  Neck: R carotid bruit. JVD not elevated. Lungs: Clear bilaterally to auscultation without wheezes, rales, or rhonchi. Breathing is unlabored. Heart: RRR with S1 S2. No rubs or gallops appreciated. Very soft SEM at apex. Abdomen: Soft, non-tender, non-distended with normoactive bowel sounds. No hepatomegaly. No rebound/guarding. No obvious abdominal masses. Pd cath in place Msk:  Strength and tone appear normal for age. Extremities: No clubbing or cyanosis. Tr bilateral LE edema.  Distal pedal pulses are 2+ and equal bilaterally. Neuro: Alert and oriented X 3. No facial asymmetry. No focal deficit. Moves all extremities spontaneously. Psych:  Responds to questions appropriately with a normal affect.   Assessment and Plan:   1. ESRD on peritoneal dialysis, here with nonfunctioning PD catheter 2. CAD s/p DES to LAD 01/2012, residual PL disease 3. R carotid bruit 4. Diabetes mellitus 5. HTN 6. HL 7. Chronic anemia 8. Hypokalemia, lytes managed by renal  Dr. Antoine Poche  discussed the case with Dr. Magnus Ivan. Historically the patient underwent surgery for lap peritoneal dialysis catheter 2 months post-stent and this was performed while on Plavix. Thus we would recommend to continue Plavix if felt fit by surgery team since this has been done previously. She is not having any symptoms suggestive of angina. Continue home beta blocker and statin. Continue ASA if OK with surgery as well. See below for additional thoughts.  Signed, Dayna Dunn PA-C 07/05/2012, 4:55 PM  History and all data above reviewed.  Patient examined.  I agree with the findings as above.  The patient was admitted to have her peritoneal dialysis catheter revised.  We are asked about holding her Plavix and possible "bridging".  She had a DES in 01/2012.  She has had no recent cardiovascular complaints.  The patient exam reveals COR:RRR  ,  Lungs: Clear  ,  Abd: Positive bowel sounds, no rebound no guarding, Ext No edema  .  All available labs, radiology testing, previous records reviewed. Agree with documented assessment and plan. I spoke with Dr. Magnus Ivan on call.  He will not be doing the surgery but thought that it was low risk for bleeding particularly as it is a revision.  We check and we saw that her previous procedure was done on Plavix.  Therefore, if bleeding risk is low on Plavix we would prefer to continue this and ASA as it has been less than 12 months since her DES.   Fayrene Fearing Kourtland Coopman  7:58 PM  07/05/2012

## 2012-07-05 NOTE — H&P (Signed)
I have seen and examined this patient and agree with plan as outlined by Gerome Apley, PA-C.  Unfortunately Lori Greer has not had adequate dialysis since Monday and has 6L of excess fluid.  Will plan to place temporary catheter today for HD and UF.  Will keep trialysis catheter in place until she is able to have the procedure on her PD cath on Monday 07/08/12.  I have also consulted [facility] for vein mapping and placement of a more permanent backup access such as an AVF or AVG.  Pt is amenable to plan as outlined above.  Informed consent was obtained. Cord Wilczynski A,MD 07/05/2012 4:29 PM

## 2012-07-05 NOTE — Telephone Encounter (Signed)
Spoke to [place] this morning who stated that patient's PD cath is unable to be used by flushing or pulling.  She states they have tried everything with no result.  XR was done to show cath is not positioned correctly.  Derrell Lolling MD is currently unavailable so XR and ED notes were taken to Dr. Donell Beers to review and let this RN know the appropriate plan forward.

## 2012-07-05 NOTE — Consult Note (Signed)
[facility] Consult  Reason for Consult:  In need of HD access Referring Physician:  Coladanato  [id number]  History of Present Illness: This is a 42 y.o. female who presented to the hospital 07/05/12 with a non working PD catheter.  She was admitted and a surgery consult was obtained to manipulate catheter.  She could not go to surgery on Friday as she had eaten lunch.  She has been rescheduled for Monday.  [facility] is consulted for HD access for a back up in case she has further problems with her PD catheter in the future.  On Friday, she was taken to get a femoral catheter, but this was unsuccessful.  On Saturday, she had a temporary catheter placed and underwent HD.   She is right handed.  She does have a hx of CAD and had a drug eluding stent placed in January 2014 and is on Plavix.  She also has HTN for which she takes medications for.  She is also on a statin for hypercholesterolemia.  Past Medical History  Diagnosis Date  . Bell's palsy 05/12/2008  . DIABETES MELLITUS, TYPE II 03/16/2008  . HYPERTENSION 01/06/2010  . OBESITY 03/16/2008  . Proliferative diabetic retinopathy(362.02) 04/23/2009  . STASIS DERMATITIS 07/06/2009  . Fibroid, uterine   . Murmur   . Chronic renal failure     ESRD on HD  . Anemia   . CAD (coronary artery disease) 01/2012    cardiac cath 02/23/2012 - 2 vessel CAD s/p DES to mid-LAD    Past Surgical History  Procedure Laterality Date  . Appendectomy  2012  . Uterine fibroid surgery    . Abdominal hysterectomy    . Abcess drainage  2002    Right axilla  . Insertion of dialysis catheter  January 2014    Right IJ   . Capd insertion Left 04/08/2012    Procedure: LAPAROSCOPIC INSERTION CONTINUOUS AMBULATORY PERITONEAL DIALYSIS  (CAPD) CATHETER;  Surgeon: Axel Filler, MD;  Location: [facility] OR;  Service: General;  Laterality: Left;    No Known Allergies  Prior to Admission medications   Medication Sig Start Date End Date Taking? Authorizing  Provider  aspirin EC 81 MG EC tablet Take 1 tablet (81 mg total) by mouth daily. 02/24/12   Brooke O Edmisten, PA-C  atenolol (TENORMIN) 50 MG tablet Take 50 mg by mouth daily.    Historical Provider, MD  bisacodyl (DULCOLAX) 5 MG EC tablet Take 5 mg by mouth daily as needed for constipation.    Historical Provider, MD  clopidogrel (PLAVIX) 75 MG tablet Take 1 tablet (75 mg total) by mouth daily with breakfast. 02/24/12   Herby Abraham Edmisten, PA-C  Epoetin Alfa (EPOGEN IJ) Inject 7,000 Units as directed once a week. On mondays    Historical Provider, MD  folic acid-vitamin b complex-vitamin c-selenium-zinc (DIALYVITE) 3 MG TABS Take 1 tablet by mouth daily.    Historical Provider, MD  metoprolol succinate (TOPROL-XL) 50 MG 24 hr tablet Take 50 mg by mouth daily. 04/10/12   Lewayne Bunting, MD  potassium chloride SA (K-DUR,KLOR-CON) 20 MEQ tablet Take 20 mEq by mouth daily.    Historical Provider, MD  pravastatin (PRAVACHOL) 40 MG tablet Take 40 mg by mouth every evening.    Historical Provider, MD  sevelamer (RENVELA) 800 MG tablet Take 1,600 mg by mouth 3 (three) times daily with meals. Takes 1600mg  with each meal, and 800mg  with each snack.    Historical Provider, MD  History   Social History  . Marital Status: Married    Spouse Name: N/A    Number of Children: 5  . Years of Education: N/A   Occupational History  . CNA    Social History Main Topics  . Smoking status: Former Smoker    Types: Cigarettes    Quit date: 03/12/2004  . Smokeless tobacco: Never Used  . Alcohol Use: Yes     Comment: rarely  . Drug Use: No  . Sexually Active: Not Currently   Other Topics Concern  . Not on file   Social History Narrative   5 Children          Family History  Problem Relation Age of Onset  . Heart disease Mother     MI at age 76  . Diabetes Father   . Kidney failure Father   . Diabetes Sister     1 sister with diabtes  . Diabetes Brother     ROS: [x]  Positive   [ ]  Negative    [ ]  All sytems reviewed and are negative  General: [ ]  Weight loss, [ ]  Fever, [ ]  chills Neurologic: [ ]  Dizziness, [ ]  Blackouts, [ ]  Seizure [ ]  Stroke, [ ]  "Mini stroke", [ ]  Slurred speech, [ ]  Temporary blindness; [ ]  weakness in arms or legs, [ ]  Hoarseness Cardiac:  [ ]  Chest pain/pressure, [ ]  Shortness of breath at rest [ ]  Shortness of breath with exertion, [ ]  Atrial fibrillation or irregular heartbeat [x]  hx of stent in heart earlier this year-denies any recent CP Vascular: [ ]  Pain in legs with walking, [ ]  Pain in legs at rest, [ ]  Pain in legs at night, [x]  soreness in legs with walking/rest denies cramping with walking  [ ]  Non-healing ulcer, [ ]  Blood clot in vein/DVT,   Pulmonary: [ ]  Home oxygen, [ ]  Productive cough, [ ]  Coughing up blood, [ ]  Asthma,  [ ]  Wheezing Musculoskeletal:  [ ]  Arthritis, [ ]  Low back pain, [ ]  Joint pain Hematologic: [ ]  Easy Bruising, [x ] Anemia; [ ]  Hepatitis Gastrointestinal: [ ]  Blood in stool, [ ]  Gastroesophageal Reflux/heartburn, [ ]  Trouble swallowing Urinary: [x ] ESRD, [ ]  on HD - [ ]  MWF or [ ]  TTHS, [x]  PD  [ ]  Burning with urination, [ ]  Difficulty urinating Endocrine: [x ] hx of diabetes, [ ]  hx of thyroid disease Skin: [ ]  Rashes, [ ]  Wounds Psychological: [ ]  Anxiety, [ ]  Depression   Physical Examination  Filed Vitals:   07/05/12 1310  BP: 160/82  Pulse: 72  Temp: 98.7 F (37.1 C)  Resp: 18   Body mass index is 32.13 kg/(m^2).  General:  WDWN in NAD Gait: Normal HENT: WNL, normocephalic Eyes: Pupils equal Pulmonary: normal non-labored breathing , without Rales, rhonchi,  wheezing Cardiac: RRR, without  Murmurs, rubs or gallops; without carotid bruits Skin: without rashes, without ulcers  Vascular Exam/Pulses:  Palpable bilateral radial pulses; there is an IV present in the left arm Extremities: without ischemic changes, without Gangrene , without cellulitis; without open wounds;  Musculoskeletal: no muscle  wasting or atrophy  Neurologic: A&O X 3; Appropriate Affect ; SENSATION: normal; MOTOR FUNCTION:  moving all extremities equally. Speech is fluent/normal   CBC    Component Value Date/Time   WBC 5.8 07/04/2012 2149   RBC 2.74* 07/04/2012 2149   HGB 8.6* 07/04/2012 2149   HCT 24.1* 07/04/2012 2149   PLT 160  07/04/2012 2149   MCV 88.0 07/04/2012 2149   MCH 31.4 07/04/2012 2149   MCHC 35.7 07/04/2012 2149   RDW 14.9 07/04/2012 2149   LYMPHSABS 2.3 07/04/2012 2149   MONOABS 0.5 07/04/2012 2149   EOSABS 0.3 07/04/2012 2149   BASOSABS 0.0 07/04/2012 2149    BMET    Component Value Date/Time   NA 139 07/04/2012 2149   K 2.8* 07/04/2012 2149   CL 105 07/04/2012 2149   CO2 23 07/04/2012 2149   GLUCOSE 85 07/04/2012 2149   BUN 61* 07/04/2012 2149   CREATININE 7.67* 07/04/2012 2149   CALCIUM 8.5 07/04/2012 2149   GFRNONAA 6* 07/04/2012 2149   GFRAA 7* 07/04/2012 2149     Non-Invasive Vascular Imaging:  Upper extremity vein mapping 07/06/12:  Right upper extremity: Cephalic  Segment  Diameter  Depth  Comment   1. Axilla  4.81mm  mm    2. Mid upper arm  4.61mm  mm    3. Above AC  4.89mm  mm    4. In AC  4.23mm  mm    5. Below AC  2.63mm  mm  branch   6. Mid forearm  2.74mm  mm    7. Wrist  2.30mm  mm     mm  mm     mm  mm     mm  mm     Left upper extremity: Cephalic  Segment  Diameter  Depth  Comment   1. Axilla  3.74mm  mm    2. Mid upper arm  mm  mm    3. Above AC  3.10mm  mm    4. In [facility]  4.28mm  mm    5. Below AC  2.74mm  mm    6. Mid forearm  2.88mm  mm    7. Wrist  2.36mm  mm     mm  mm     mm  mm     mm  mm       ASSESSMENT/PLAN: This is a 42 y.o. female with malfunctioning PD catheter who will be taken to the OR tomorrow for re-positioning.  She will then be discharged.  -pt will be scheduled for a left brachiocephalic AVF as pt is right handed. -d/c IV in left arm and place bracelet for no sticks to left arm (ordered) -pt states that she would like to wait to come in after 4th of  July weekend to have her fistula placed.   Doreatha Massed, PA-C [facility] [phone]  Agree with above assessment Plan left brachial to cephalic A-V fistula as soon as patient would like to schedule this diet she would like to wait until after July 4

## 2012-07-05 NOTE — ED Provider Notes (Signed)
I saw and evaluated the patient, reviewed the resident's note and I agree with the findings and plan.  Pt with PD catheter which has not been draining properly. Denies fever or abdominal pain. She does not need emergent dialysis tonight. Has appointment in the AM to have her catheter worked on.   Annalyse Langlais B. Bernette Mayers, MD 07/05/12 [phone]

## 2012-07-06 ENCOUNTER — Inpatient Hospital Stay (HOSPITAL_COMMUNITY): Payer: Medicare Other

## 2012-07-06 DIAGNOSIS — Z01810 Encounter for preprocedural cardiovascular examination: Secondary | ICD-10-CM

## 2012-07-06 DIAGNOSIS — I2510 Atherosclerotic heart disease of native coronary artery without angina pectoris: Secondary | ICD-10-CM

## 2012-07-06 LAB — CBC
HCT: 23.4 % — ABNORMAL LOW (ref 36.0–46.0)
Hemoglobin: 8.1 g/dL — ABNORMAL LOW (ref 12.0–15.0)
MCH: 30.7 pg (ref 26.0–34.0)
MCHC: 34.6 g/dL (ref 30.0–36.0)
MCV: 88.6 fL (ref 78.0–100.0)
Platelets: 164 10*3/uL (ref 150–400)
RBC: 2.64 MIL/uL — ABNORMAL LOW (ref 3.87–5.11)
RDW: 15 % (ref 11.5–15.5)
WBC: 4.8 10*3/uL (ref 4.0–10.5)

## 2012-07-06 LAB — RENAL FUNCTION PANEL
Albumin: 2.5 g/dL — ABNORMAL LOW (ref 3.5–5.2)
BUN: 61 mg/dL — ABNORMAL HIGH (ref 6–23)
CO2: 21 mEq/L (ref 19–32)
Calcium: 8.4 mg/dL (ref 8.4–10.5)
Chloride: 108 mEq/L (ref 96–112)
Creatinine, Ser: 7.44 mg/dL — ABNORMAL HIGH (ref 0.50–1.10)
GFR calc Af Amer: 7 mL/min — ABNORMAL LOW (ref >90)
GFR calc non Af Amer: 6 mL/min — ABNORMAL LOW (ref >90)
Glucose, Bld: 91 mg/dL (ref 70–99)
Phosphorus: 5.2 mg/dL — ABNORMAL HIGH (ref 2.3–4.6)
Potassium: 3 mEq/L — ABNORMAL LOW (ref 3.5–5.1)
Sodium: 140 mEq/L (ref 135–145)

## 2012-07-06 LAB — HEPATITIS B SURFACE ANTIBODY,QUALITATIVE: Hep B S Ab: REACTIVE — AB

## 2012-07-06 LAB — HEPATITIS B SURFACE ANTIGEN: Hepatitis B Surface Ag: NEGATIVE

## 2012-07-06 LAB — HEPATITIS B CORE ANTIBODY, TOTAL: Hep B Core Total Ab: NEGATIVE

## 2012-07-06 MED ORDER — SODIUM CHLORIDE 0.9 % IV SOLN: 100.0000 mL | INTRAVENOUS | Status: DC | PRN

## 2012-07-06 MED ORDER — LIDOCAINE-PRILOCAINE 2.5-2.5 % EX CREA
1.0000 "application " | TOPICAL_CREAM | CUTANEOUS | Status: DC | PRN
  Filled 2012-07-06: qty 5

## 2012-07-06 MED ORDER — ALTEPLASE 2 MG IJ SOLR
2.0000 mg | Freq: Once | INTRAMUSCULAR | Status: DC | PRN
  Filled 2012-07-06: qty 2

## 2012-07-06 MED ORDER — ACETAMINOPHEN 325 MG PO TABS
650.0000 mg | ORAL_TABLET | Freq: Four times a day (QID) | ORAL | Status: DC | PRN
  Administered 2012-07-06: 650 mg via ORAL
  Filled 2012-07-06: qty 2

## 2012-07-06 MED ORDER — NEPRO/CARBSTEADY PO LIQD
237.0000 mL | ORAL | Status: DC | PRN
  Filled 2012-07-06: qty 237

## 2012-07-06 MED ORDER — PENTAFLUOROPROP-TETRAFLUOROETH EX AERO: 1.0000 "application " | INHALATION_SPRAY | CUTANEOUS | Status: DC | PRN

## 2012-07-06 MED ORDER — HEPARIN SODIUM (PORCINE) 1000 UNIT/ML DIALYSIS: 1000.0000 [IU] | INTRAMUSCULAR | Status: DC | PRN

## 2012-07-06 MED ORDER — LIDOCAINE HCL (PF) 1 % IJ SOLN: 5.0000 mL | INTRAMUSCULAR | Status: DC | PRN

## 2012-07-06 MED ORDER — ASPIRIN 81 MG PO CHEW
81.0000 mg | CHEWABLE_TABLET | Freq: Every day | ORAL | Status: DC
  Administered 2012-07-06 – 2012-07-09 (×3): 81 mg via ORAL
  Filled 2012-07-06 (×2): qty 1

## 2012-07-06 MED ORDER — POTASSIUM CHLORIDE CRYS ER 20 MEQ PO TBCR
40.0000 meq | EXTENDED_RELEASE_TABLET | Freq: Two times a day (BID) | ORAL | Status: DC
  Administered 2012-07-06 – 2012-07-07 (×3): 40 meq via ORAL
  Filled 2012-07-06 (×5): qty 2

## 2012-07-06 NOTE — Progress Notes (Signed)
Hemodialysis-Cath placed by Dr. Arrie Aran prior to procedure. No complaints. Hemodialysis completed without issue. Goal met. Report given to RN on 6700. Transported in stable condition.

## 2012-07-06 NOTE — Progress Notes (Signed)
Subjective:  NO cardiac c/o  Objective:  Vital Signs in the last 24 hours: BP 141/64  Pulse 77  Temp(Src) 98.7 F (37.1 C) (Oral)  Resp 20  Ht 5\' 2"  (1.575 m)  Wt 80.3 kg (177 lb 0.5 oz)  BMI 32.37 kg/m2  SpO2 100%  Physical Exam: Pleasant BF in NAD Lungs:  Clear Cardiac:  Regular rhythm, normal S1 and S2, no S3  Intake/Output from previous day:   Weight Filed Weights   07/05/12 1310 07/05/12 2201  Weight: 79.7 kg (175 lb 11.3 oz) 80.3 kg (177 lb 0.5 oz)    Lab Results: Basic Metabolic Panel:  Recent Labs  16/10/96 2149 07/06/12 0834  NA 139 140  K 2.8* 3.0*  CL 105 108  CO2 23 21  GLUCOSE 85 91  BUN 61* 61*  CREATININE 7.67* 7.44*    CBC:  Recent Labs  07/04/12 2149 07/06/12 0834  WBC 5.8 4.8  NEUTROABS 2.7  --   HGB 8.6* 8.1*  HCT 24.1* 23.4*  MCV 88.0 88.6  PLT 160 164   Assessment/Plan: 1. ESRD on peritoneal dialysis, here with nonfunctioning PD catheter  2. CAD s/p DES to LAD 01/2012, residual PL disease   REC:  No cardiac c/o and should continue on Plavix and aspirin as per Dr. Jenene Slicker note.  Please call if  can be of further assistance.       Darden Palmer  MD [facility] Card[facility]  07/06/2012, 10:04 AM

## 2012-07-06 NOTE — Progress Notes (Signed)
Right  Upper Extremity Vein Map    Cephalic  Segment Diameter Depth Comment  1. Axilla 4.53mm mm   2. Mid upper arm 4.70mm mm   3. Above AC 4.34mm mm   4. In AC 4.22mm mm   5. Below AC 2.35mm mm branch  6. Mid forearm 2.85mm mm   7. Wrist 2.45mm mm    mm mm    mm mm    mm mm       Left Upper Extremity Vein Map    Cephalic  Segment Diameter Depth Comment  1. Axilla 3.21mm mm   2. Mid upper arm mm mm   3. Above AC 3.78mm mm   4. In [facility] 4.9mm mm   5. Below AC 2.44mm mm   6. Mid forearm 2.23mm mm   7. Wrist 2.41mm mm    mm mm    mm mm    mm mm

## 2012-07-06 NOTE — Progress Notes (Signed)
Pt seen briefly this morning with Dr. Hart Rochester.  She is right handed.   Pt going to HD this am.  She was in radiology yesterday when formal consult was requested.  Formal consult to follow in the morning.  Vein mapping ordered for today for plan for HD access.  Lori Greer 07/06/2012 10:48 AM

## 2012-07-06 NOTE — Procedures (Signed)
Hemodialysis Catheter Insertion Procedure Note LETRICIA KRINSKY [id number] Feb 08, 1970  Procedure: Insertion of Hemodialysis Catheter Indications: Dialysis Access   Procedure Details Consent: Risks of procedure as well as the alternatives and risks of each were explained to the (patient/caregiver).  Consent for procedure obtained. Time Out: Verified patient identification, verified procedure, site/side was marked, verified correct patient position, special equipment/implants available, medications/allergies/relevent history reviewed, required imaging and test results available.  Performed  Maximum sterile technique was used including antiseptics, gloves and gown. Skin prep: Chlorhexidine; local anesthetic administered Triple lumen hemodialysis catheter was inserted into left femoral vein due to patient being a dialysis patient using the Seldinger technique.  Evaluation Complications: No apparent complications Patient did tolerate procedure well.    Baley Shands A 07/06/2012

## 2012-07-06 NOTE — Progress Notes (Signed)
Subjective:   No current complaints, urinating more, but no BM; no dyspnea, good appetite  Objective: Vital signs in last 24 hours: Temp:  [98.4 F (36.9 C)-98.9 F (37.2 C)] 98.7 F (37.1 C) (06/14 0459) Pulse Rate:  [70-79] 79 (06/14 0459) Resp:  [18-20] 20 (06/14 0459) BP: (125-160)/(68-82) 125/71 mmHg (06/14 0459) SpO2:  [100 %] 100 % (06/14 0459) Weight:  [79.7 kg (175 lb 11.3 oz)-80.3 kg (177 lb 0.5 oz)] 80.3 kg (177 lb 0.5 oz) (06/13 2201) Weight change:      EXAM: General appearance:  Alert, in no apparent distress Resp:  CTA without rales, rhonchi, or wheezes Cardio:  RRR without murmur or rub GI:  + BS, soft and nontender Extremities:  No edema Access:  PD catheter  Lab Results:  Recent Labs  07/04/12 2149  WBC 5.8  HGB 8.6*  HCT 24.1*  PLT 160   BMET:  Recent Labs  07/04/12 2149  NA 139  K 2.8*  CL 105  CO2 23  GLUCOSE 85  BUN 61*  CREATININE 7.67*  CALCIUM 8.5   No results found for this basename: PTH,  in the last 72 hours Iron Studies: No results found for this basename: IRON, TIBC, TRANSFERRIN, FERRITIN,  in the last 72 hours  Dialysis Orders: CAPD x 7 EDW 73 kg 3 daytime exchanges (dwell time 4 hrs) and 1 nighttime exchange (dwell time 8 hrs) with fill vol 2   Assessment/Plan: 1. Poorly functioning PD catheter - no movement of fluid in or out yesterday (per nurse), x-ray showed catheter coiled in right midline of abdomen; laparoscopic repositioning on 6/16 per Surgery. 2. CAD - s/p PTCA with drug-eluting stent placed @ LAD by Dr. [place] 02/23/12; on Plavix (last taken yesterday), may continue Plavix and ASA per Cardiology. 3. Hypokalemia - K 2.8, restarted KCl @ 40 mEq bid while on Lactulose tid.  Recheck K this morning. 4. ESRD - CAPD, 4 exchanges 7 days a week; temporary catheter placement today for HD today. 5. Hypertension/volume - BP 125/71 on Atenolol 50 mg bid & Metoprolol 50 mg qd; current wt 80.3 kg with EDW 73 kg. Hold BP meds today  until post-HD. 6. Anemia - Hgb 8.6, no ESA or Fe. Aranesp 100 mcg with HD today. 7. Metabolic bone disease - Ca 8.5, last P 5.5, iPTH 718 on 6/5; on Renvela 2 with meals, 1 with snacks. 8. Nutrition - last Alb 3.5, high protein renal diet, vitamin.     LOS: 1 day   LYLES,CHARLES 07/06/2012,8:57 AM   I have seen and examined this patient and agree with plan as outlined by Gerome Apley, PA-C.  Unfortunately she will not be able to have her PD catheter replaced/repositioned until Monday per [facility].  Cont with lacutlose to help flip back into position, however we are currently unable to flush catheter.  Have also consulted [facility] for vein mapping and outpt placement of an AVF next week.  Plan for HD today and hopefully resume PD on Monday after procedure. Idella Lamontagne A,MD 07/06/2012 11:32 AM

## 2012-07-07 ENCOUNTER — Encounter (HOSPITAL_COMMUNITY): Payer: Self-pay | Admitting: General Practice

## 2012-07-07 LAB — RENAL FUNCTION PANEL
Albumin: 2.9 g/dL — ABNORMAL LOW (ref 3.5–5.2)
BUN: 39 mg/dL — ABNORMAL HIGH (ref 6–23)
CO2: 26 mEq/L (ref 19–32)
Calcium: 9.1 mg/dL (ref 8.4–10.5)
Chloride: 107 mEq/L (ref 96–112)
Creatinine, Ser: 4.96 mg/dL — ABNORMAL HIGH (ref 0.50–1.10)
GFR calc Af Amer: 11 mL/min — ABNORMAL LOW (ref >90)
GFR calc non Af Amer: 10 mL/min — ABNORMAL LOW (ref >90)
Glucose, Bld: 118 mg/dL — ABNORMAL HIGH (ref 70–99)
Phosphorus: 3.7 mg/dL (ref 2.3–4.6)
Potassium: 3.9 mEq/L (ref 3.5–5.1)
Sodium: 140 mEq/L (ref 135–145)

## 2012-07-07 LAB — CBC
HCT: 26.2 % — ABNORMAL LOW (ref 36.0–46.0)
Hemoglobin: 9.2 g/dL — ABNORMAL LOW (ref 12.0–15.0)
MCH: 31.3 pg (ref 26.0–34.0)
MCHC: 35.1 g/dL (ref 30.0–36.0)
MCV: 89.1 fL (ref 78.0–100.0)
Platelets: 173 10*3/uL (ref 150–400)
RBC: 2.94 MIL/uL — ABNORMAL LOW (ref 3.87–5.11)
RDW: 14.8 % (ref 11.5–15.5)
WBC: 4.9 10*3/uL (ref 4.0–10.5)

## 2012-07-07 MED ORDER — DEXTROSE 5 % IV SOLN
3.0000 g | INTRAVENOUS | Status: AC
  Administered 2012-07-08: 3 g via INTRAVENOUS
  Filled 2012-07-07: qty 3000

## 2012-07-07 MED ORDER — POTASSIUM CHLORIDE CRYS ER 20 MEQ PO TBCR
40.0000 meq | EXTENDED_RELEASE_TABLET | Freq: Every day | ORAL | Status: DC
  Administered 2012-07-09: 40 meq via ORAL
  Filled 2012-07-07 (×2): qty 2

## 2012-07-07 MED ORDER — CHLORHEXIDINE GLUCONATE 4 % EX LIQD
1.0000 "application " | Freq: Once | CUTANEOUS | Status: AC
  Administered 2012-07-08: 1 via TOPICAL
  Filled 2012-07-07: qty 15

## 2012-07-07 NOTE — Progress Notes (Signed)
Pt needs to have PD cath repositioned.  Placed by Dr Derrell Lolling.  Will set up for Monday and will let Dr. Derrell Lolling know.  Will pass on to DOW Dr Andrey Campanile as well if he can possibly assist.   Will make NPO after midnight and   Schedule for Monday.   Discussed with pt.

## 2012-07-07 NOTE — Progress Notes (Signed)
07/07/12 1643 nsg Dr. Luisa Hart wrote an order to d/c plavix, aspirin per comment hold med  5 days prior to surgery at 1124. No order to hold plavix nor aspirin was written before that and  patient received her daily meds of plavix and aspirin at 1000 today. MD on call Dr. Dwain Sarna notified no new orders.

## 2012-07-07 NOTE — Progress Notes (Signed)
Subjective:  No complaints, one slightly loose BM, but not responding to Lactulose  Objective: Vital signs in last 24 hours: Temp:  [97.6 F (36.4 C)-98.8 F (37.1 C)] 97.6 F (36.4 C) (06/14 1928) Pulse Rate:  [65-90] 80 (06/14 1928) Resp:  [16-20] 20 (06/14 1928) BP: (105-159)/(63-101) 128/65 mmHg (06/14 1928) SpO2:  [98 %-100 %] 98 % (06/14 1928) Weight:  [76 kg (167 lb 8.8 oz)-79.9 kg (176 lb 2.4 oz)] 76 kg (167 lb 8.8 oz) (06/14 1500) Weight change: 0.2 kg (7.1 oz)  Intake/Output from previous day: 06/14 0701 - 06/15 0700 In: 480 [P.O.:480] Out: 3900    EXAM: General appearance:  Alert, in no apparent distress Resp:  CTA without rales, rhonchi, or wheezes Cardio:  RRR without murmur or rub GI:  + BS, soft and nontender Extremities:  No edema Access:  Left temporary femoral catheter secure & clean; PD catheter  Lab Results:  Recent Labs  07/04/12 2149 07/06/12 0834  WBC 5.8 4.8  HGB 8.6* 8.1*  HCT 24.1* 23.4*  PLT 160 164   BMET:   Recent Labs  07/04/12 2149 07/06/12 0834  NA 139 140  K 2.8* 3.0*  CL 105 108  CO2 23 21  GLUCOSE 85 91  BUN 61* 61*  CREATININE 7.67* 7.44*  CALCIUM 8.5 8.4  ALBUMIN  --  2.5*   No results found for this basename: PTH,  in the last 72 hours Iron Studies: No results found for this basename: IRON, TIBC, TRANSFERRIN, FERRITIN,  in the last 72 hours  Dialysis Orders: CAPD x 7 EDW 73 kg 3 daytime exchanges (dwell time 4 hrs) and 1 nighttime exchange (dwell time 8 hrs) with fill vol 2   Assessment/Plan: 1. Poorly functioning PD catheter - no movement of fluid in or out on 6/13 (per nurse), x-ray showed catheter coiled in right midline of abdomen; laparoscopic repositioning on 6/16 per Surgery. 2. CAD - s/p PTCA with drug-eluting stent placed @ LAD by Dr. [place] 02/23/12; on Plavix (last taken yesterday), may continue Plavix and ASA per Cardiology. 3. Hypokalemia - K 2.8, restarted KCl @ 40 mEq bid while on Lactulose tid; s/p HD  with 4K bath yesterday.  Recheck K this morning. 4. ESRD - CAPD, 4 exchanges 7 days a week; HD yesterday after placement of temporary L femoral catheter; vein mapping yesterday for placement of permanent HD access. 5. Hypertension/volume - BP 128/65 on Atenolol 50 mg bid & Metoprolol 50 mg qd; wt 76 kg s/p net UF 3.9 L yesterday,  EDW 73 kg. 6. Anemia - Hgb 8.1, no ESA or Fe; s/p Aranesp 100 mcg with HD yesterday. 7. Metabolic bone disease - Ca 8.4 (9.6 corrected), P 5.2, iPTH 718 on 6/5; on Renvela 2 with meals, 1 with snacks. 8. Nutrition - last Alb 3.5, high protein renal diet, vitamin.     LOS: 2 days   LYLES,CHARLES 07/07/2012,7:20 AM  I have seen and examined this patient and agree with plan as outlined by Gerome Apley, PA-C.  Will make her npo after midnight for hopeful procedure tomorrow to replace/reposition nonfunctioning PD catheter.  K3.9, BUN/Cr 39/4.96 and had UF with HD yesterday. Japleen Tornow A,MD 07/07/2012 10:34 AM

## 2012-07-08 ENCOUNTER — Other Ambulatory Visit: Payer: Self-pay

## 2012-07-08 ENCOUNTER — Encounter (HOSPITAL_COMMUNITY): Payer: Self-pay | Admitting: Certified Registered"

## 2012-07-08 ENCOUNTER — Inpatient Hospital Stay (HOSPITAL_COMMUNITY): Payer: Medicare Other | Admitting: Certified Registered"

## 2012-07-08 ENCOUNTER — Encounter (HOSPITAL_COMMUNITY)
Admission: AD | Disposition: A | Discharge status: Home / Self Care | Payer: Self-pay | Source: Ambulatory Visit | Attending: Nephrology

## 2012-07-08 HISTORY — PX: LAPAROSCOPIC REPOSITIONING CAPD CATHETER: SHX5920

## 2012-07-08 LAB — CBC
HCT: 23.3 % — ABNORMAL LOW (ref 36.0–46.0)
Hemoglobin: 8.3 g/dL — ABNORMAL LOW (ref 12.0–15.0)
MCH: 31.4 pg (ref 26.0–34.0)
MCHC: 35.6 g/dL (ref 30.0–36.0)
MCV: 88.3 fL (ref 78.0–100.0)
Platelets: 137 10*3/uL — ABNORMAL LOW (ref 150–400)
RBC: 2.64 MIL/uL — ABNORMAL LOW (ref 3.87–5.11)
RDW: 14.4 % (ref 11.5–15.5)
WBC: 5.9 10*3/uL (ref 4.0–10.5)

## 2012-07-08 LAB — GLUCOSE, CAPILLARY: Glucose-Capillary: 76 mg/dL (ref 70–99)

## 2012-07-08 LAB — RENAL FUNCTION PANEL
Albumin: 2.6 g/dL — ABNORMAL LOW (ref 3.5–5.2)
BUN: 46 mg/dL — ABNORMAL HIGH (ref 6–23)
CO2: 20 mEq/L (ref 19–32)
Calcium: 8.9 mg/dL (ref 8.4–10.5)
Chloride: 108 mEq/L (ref 96–112)
Creatinine, Ser: 5.81 mg/dL — ABNORMAL HIGH (ref 0.50–1.10)
GFR calc Af Amer: 9 mL/min — ABNORMAL LOW (ref >90)
GFR calc non Af Amer: 8 mL/min — ABNORMAL LOW (ref >90)
Glucose, Bld: 81 mg/dL (ref 70–99)
Phosphorus: 4.3 mg/dL (ref 2.3–4.6)
Potassium: 3.8 mEq/L (ref 3.5–5.1)
Sodium: 139 mEq/L (ref 135–145)

## 2012-07-08 LAB — SURGICAL PCR SCREEN
MRSA, PCR: NEGATIVE
Staphylococcus aureus: NEGATIVE

## 2012-07-08 SURGERY — REPOSITIONING, CATHETER, CAPD, LAPAROSCOPIC
Anesthesia: General | Site: Abdomen | Wound class: Contaminated

## 2012-07-08 MED ORDER — LIDOCAINE HCL (CARDIAC) 20 MG/ML IV SOLN
INTRAVENOUS | Status: DC | PRN
  Administered 2012-07-08: 100 mg via INTRAVENOUS

## 2012-07-08 MED ORDER — FENTANYL CITRATE 0.05 MG/ML IJ SOLN
INTRAMUSCULAR | Status: DC | PRN
  Administered 2012-07-08 (×2): 50 ug via INTRAVENOUS
  Administered 2012-07-08: 100 ug via INTRAVENOUS
  Administered 2012-07-08: 150 ug via INTRAVENOUS

## 2012-07-08 MED ORDER — HEPARIN SODIUM (PORCINE) 1000 UNIT/ML DIALYSIS: 1000.0000 [IU] | INTRAMUSCULAR | Status: DC | PRN

## 2012-07-08 MED ORDER — NEPRO/CARBSTEADY PO LIQD
237.0000 mL | ORAL | Status: DC | PRN
  Filled 2012-07-08: qty 237

## 2012-07-08 MED ORDER — DEXAMETHASONE SODIUM PHOSPHATE 4 MG/ML IJ SOLN
INTRAMUSCULAR | Status: DC | PRN
  Administered 2012-07-08: 4 mg via INTRAVENOUS

## 2012-07-08 MED ORDER — DEXTROSE 5 % IV SOLN
INTRAVENOUS | Status: DC | PRN
  Administered 2012-07-08: 18:00:00 via INTRAVENOUS

## 2012-07-08 MED ORDER — CALCITRIOL 0.5 MCG PO CAPS
1.0000 ug | ORAL_CAPSULE | Freq: Every day | ORAL | Status: DC
  Administered 2012-07-09: 1 ug via ORAL
  Filled 2012-07-08 (×2): qty 2

## 2012-07-08 MED ORDER — ONDANSETRON HCL 4 MG/2ML IJ SOLN
INTRAMUSCULAR | Status: DC | PRN
  Administered 2012-07-08: 4 mg via INTRAVENOUS

## 2012-07-08 MED ORDER — OXYCODONE HCL 5 MG PO TABS: 5.0000 mg | ORAL_TABLET | Freq: Once | ORAL | Status: AC | PRN

## 2012-07-08 MED ORDER — MIDAZOLAM HCL 5 MG/5ML IJ SOLN
INTRAMUSCULAR | Status: DC | PRN
  Administered 2012-07-08: 2 mg via INTRAVENOUS

## 2012-07-08 MED ORDER — LIDOCAINE-PRILOCAINE 2.5-2.5 % EX CREA
1.0000 "application " | TOPICAL_CREAM | CUTANEOUS | Status: DC | PRN
  Filled 2012-07-08: qty 5

## 2012-07-08 MED ORDER — CLOPIDOGREL BISULFATE 75 MG PO TABS
75.0000 mg | ORAL_TABLET | Freq: Every day | ORAL | Status: DC
Start: 2012-07-09 — End: 2012-07-09
  Administered 2012-07-09: 75 mg via ORAL
  Filled 2012-07-08: qty 1

## 2012-07-08 MED ORDER — PROPOFOL 10 MG/ML IV BOLUS
INTRAVENOUS | Status: DC | PRN
  Administered 2012-07-08: 100 mg via INTRAVENOUS

## 2012-07-08 MED ORDER — SODIUM CHLORIDE 0.9 % IV SOLN: 100.0000 mL | INTRAVENOUS | Status: DC | PRN

## 2012-07-08 MED ORDER — SODIUM CHLORIDE 0.9 % IR SOLN
Status: DC | PRN
  Administered 2012-07-08: 1000 mL

## 2012-07-08 MED ORDER — LIDOCAINE HCL (PF) 1 % IJ SOLN: 5.0000 mL | INTRAMUSCULAR | Status: DC | PRN

## 2012-07-08 MED ORDER — SODIUM CHLORIDE 0.9 % IV SOLN
INTRAVENOUS | Status: DC
  Administered 2012-07-08: 18:00:00 via INTRAVENOUS

## 2012-07-08 MED ORDER — METOPROLOL TARTRATE 1 MG/ML IV SOLN
INTRAVENOUS | Status: DC | PRN
  Administered 2012-07-08: 5 mg via INTRAVENOUS

## 2012-07-08 MED ORDER — PENTAFLUOROPROP-TETRAFLUOROETH EX AERO: 1.0000 "application " | INHALATION_SPRAY | CUTANEOUS | Status: DC | PRN

## 2012-07-08 MED ORDER — DARBEPOETIN ALFA-POLYSORBATE 100 MCG/0.5ML IJ SOLN
INTRAMUSCULAR | Status: AC
  Administered 2012-07-08: 100 ug via INTRAVENOUS
  Filled 2012-07-08: qty 0.5

## 2012-07-08 MED ORDER — HYDROMORPHONE HCL PF 1 MG/ML IJ SOLN: 0.2500 mg | INTRAMUSCULAR | Status: DC | PRN

## 2012-07-08 MED ORDER — ALTEPLASE 2 MG IJ SOLR
2.0000 mg | Freq: Once | INTRAMUSCULAR | Status: AC | PRN
  Filled 2012-07-08: qty 2

## 2012-07-08 MED ORDER — BUPIVACAINE HCL 0.25 % IJ SOLN
INTRAMUSCULAR | Status: DC | PRN
  Administered 2012-07-08: 5 mL

## 2012-07-08 MED ORDER — ONDANSETRON HCL 4 MG/2ML IJ SOLN: 4.0000 mg | Freq: Once | INTRAMUSCULAR | Status: AC | PRN

## 2012-07-08 MED ORDER — OXYCODONE HCL 5 MG/5ML PO SOLN: 5.0000 mg | Freq: Once | ORAL | Status: AC | PRN

## 2012-07-08 MED ORDER — DARBEPOETIN ALFA-POLYSORBATE 100 MCG/0.5ML IJ SOLN: 100.0000 ug | INTRAMUSCULAR | Status: DC

## 2012-07-08 MED ORDER — SUCCINYLCHOLINE CHLORIDE 20 MG/ML IJ SOLN
INTRAMUSCULAR | Status: DC | PRN
  Administered 2012-07-08: 100 mg via INTRAVENOUS

## 2012-07-08 SURGICAL SUPPLY — 40 items
BENZOIN TINCTURE PRP APPL 2/3 (GAUZE/BANDAGES/DRESSINGS) ×2 IMPLANT
CANISTER SUCTION 2500CC (MISCELLANEOUS) IMPLANT
CHLORAPREP W/TINT 26ML (MISCELLANEOUS) ×2 IMPLANT
CLOTH BEACON ORANGE TIMEOUT ST (SAFETY) ×2 IMPLANT
COIL SWAN NECK LT (MISCELLANEOUS) ×2 IMPLANT
COVER SURGICAL LIGHT HANDLE (MISCELLANEOUS) ×2 IMPLANT
DECANTER SPIKE VIAL GLASS SM (MISCELLANEOUS) ×2 IMPLANT
DERMABOND ADVANCED (GAUZE/BANDAGES/DRESSINGS) ×1
DERMABOND ADVANCED .7 DNX12 (GAUZE/BANDAGES/DRESSINGS) ×1 IMPLANT
DRAPE UTILITY 15X26 W/TAPE STR (DRAPE) ×8 IMPLANT
DRAPE WARM FLUID 44X44 (DRAPE) ×2 IMPLANT
ELECT REM PT RETURN 9FT ADLT (ELECTROSURGICAL) ×2
ELECTRODE REM PT RTRN 9FT ADLT (ELECTROSURGICAL) ×1 IMPLANT
GAUZE SPONGE 2X2 8PLY STRL LF (GAUZE/BANDAGES/DRESSINGS) ×1 IMPLANT
GLOVE BIO SURGEON STRL SZ7.5 (GLOVE) ×2 IMPLANT
GLOVE BIOGEL PI IND STRL 6.5 (GLOVE) ×1 IMPLANT
GLOVE BIOGEL PI IND STRL 7.0 (GLOVE) ×1 IMPLANT
GLOVE BIOGEL PI INDICATOR 6.5 (GLOVE) ×1
GLOVE BIOGEL PI INDICATOR 7.0 (GLOVE) ×1
GLOVE SURG SS PI 6.0 STRL IVOR (GLOVE) ×2 IMPLANT
GOWN STRL NON-REIN LRG LVL3 (GOWN DISPOSABLE) ×6 IMPLANT
GOWN STRL REIN XL XLG (GOWN DISPOSABLE) ×2 IMPLANT
KIT BASIN OR (CUSTOM PROCEDURE TRAY) ×2 IMPLANT
KIT ROOM TURNOVER OR (KITS) ×2 IMPLANT
NS IRRIG 1000ML POUR BTL (IV SOLUTION) ×2 IMPLANT
PAD ARMBOARD 7.5X6 YLW CONV (MISCELLANEOUS) ×4 IMPLANT
SCISSORS LAP 5X35 DISP (ENDOMECHANICALS) IMPLANT
SET EXTENSION TUBING 8  CATH (SET/KITS/TRAYS/PACK) IMPLANT
SET IRRIG TUBING LAPAROSCOPIC (IRRIGATION / IRRIGATOR) IMPLANT
SLEEVE ENDOPATH XCEL 5M (ENDOMECHANICALS) ×4 IMPLANT
SPONGE GAUZE 2X2 STER 10/PKG (GAUZE/BANDAGES/DRESSINGS) ×1
SUT MNCRL AB 4-0 PS2 18 (SUTURE) ×2 IMPLANT
SUT SILK 3 0 SH 30 (SUTURE) ×2 IMPLANT
TOWEL OR 17X24 6PK STRL BLUE (TOWEL DISPOSABLE) ×2 IMPLANT
TOWEL OR 17X26 10 PK STRL BLUE (TOWEL DISPOSABLE) ×2 IMPLANT
TRAY LAPAROSCOPIC (CUSTOM PROCEDURE TRAY) ×2 IMPLANT
TROCAR XCEL BLUNT TIP 100MML (ENDOMECHANICALS) IMPLANT
TROCAR XCEL NON-BLD 11X100MML (ENDOMECHANICALS) ×2 IMPLANT
TROCAR XCEL NON-BLD 5MMX100MML (ENDOMECHANICALS) ×2 IMPLANT
TUBING INSUFFLATION 10FT LAP (TUBING) ×2 IMPLANT

## 2012-07-08 NOTE — Progress Notes (Signed)
[facility] Progress Note  Subjective:   For PD catheter manipulation today. NPO  Objective Filed Vitals:   07/07/12 1600 07/07/12 1700 07/07/12 2103 07/08/12 0530  BP: 118/72 120/64 133/71 109/65  Pulse: 84 87 80 73  Temp: 98.2 F (36.8 C) 98.3 F (36.8 C) 99.4 F (37.4 C) 98.8 F (37.1 C)  TempSrc: Oral Oral Oral Oral  Resp: 20 20 20 18   Height:   5\' 2"  (1.575 m)   Weight:   78.427 kg (172 lb 14.4 oz)   SpO2: 98% 98% 100% 100%   Physical Exam General: NAD Heart: RRR Lungs: clear without rales or wheezes Abdomen: soft Extremities: no LE edema Dialysis Access: PD cath; also left temp fem cath in place  Dialysis Orders: CAPD x 7d/wk   EDW 73kg    3 daytime exchanges (dwell time 4 hrs) and 1 nighttime exchange (dwell time 8 hrs) with fill vol 2   Assessment/Plan:  1. Poorly functioning PD catheter - no movement of fluid in or out on 6/13 (per nurse), x-ray showed catheter coiled in right midline of abdomen; laparoscopic repositioning on 6/16 per Surgery - she may need a short term tunneled HD catheter post laproscopic surgery OR due supine PD with lower volumes - However she normally does CAPD not CCPD  2. CAD - s/p PTCA with drug-eluting stent placed @ LAD by Dr. [place] 02/23/12; on Plavix (last taken yesterday), may continue Plavix and ASA per Cardiology. 3. Hypokalemia - K 3.9 on KCl @ 40 mEq bid while on Lactulose tid; s/p HD . Recheck K this morning. 4. ESRD - CAPD, 4 exchanges 7 days a week; HD Saturday after placement of temporary L femoral catheter; had vein mapping  for placement of permanent HD access. Seen by Dr. Hart Rochester - plan to have a left BC AVF afterh the 4th of July.  HOWEVER, if she needs a tunneled catheter after PD cath manipulation, she needs to have both done at the same time. Plan HD today with temporary catheter after PD cath manipulation. 5. Hypertension/volume - BP 128/65 on Atenolol 50 mg bid & Metoprolol 50 mg qd; wt 76 kg s/p net UF 3.9 L  yesterday, EDW 73 kg - I suspect all weights are bed scales; does not appear fluid overloaded 6. Anemia - Hgb 9.2, no ESA or Fe prior to admission; s/p Aranesp 100 mcg with HD Friday. 7. Metabolic bone disease - , P 3.7, iPTH 718 on 6/5; on Renvela 2 with meals, 1 with snacks - she needs to be started on po calcitriol - will start with 1 mcg qd. 8. Nutrition - last Alb 2.9, needs low P high protein , vitamin.  Sheffield Slider, PA-C [facility] Beeper [phone] 07/08/2012,9:55 AM  LOS: 3 days   Patient seen and examined.  Agree with assessment and plan as above. [name]  MD [phone] pgr    [phone] cell 07/08/2012, 12:47 PM   Additional Objective Labs: Basic Metabolic Panel:  Recent Labs Lab 07/04/12 2149 07/06/12 0834 07/07/12 0700  NA 139 140 140  K 2.8* 3.0* 3.9  CL 105 108 107  CO2 23 21 26   GLUCOSE 85 91 118*  BUN 61* 61* 39*  CREATININE 7.67* 7.44* 4.96*  CALCIUM 8.5 8.4 9.1  PHOS  --  5.2* 3.7   Liver Function Tests:  Recent Labs Lab 07/06/12 0834 07/07/12 0700  ALBUMIN 2.5* 2.9*   CBC:  Recent Labs Lab 07/04/12 2149 07/06/12 0834 07/07/12  0700  WBC 5.8 4.8 4.9  NEUTROABS 2.7  --   --   HGB 8.6* 8.1* 9.2*  HCT 24.1* 23.4* 26.2*  MCV 88.0 88.6 89.1  PLT 160 164 173  Medications:   . aspirin  81 mg Oral Daily  .  ceFAZolin (ANCEF) IV  3 g Intravenous On Call to OR  . [START ON 07/09/2012] clopidogrel  75 mg Oral Daily  . darbepoetin (ARANESP) injection - DIALYSIS  100 mcg Intravenous Q Fri-HD  . lactulose  20 g Oral TID  . metoprolol succinate  50 mg Oral Daily  . multivitamin  1 tablet Oral QHS  . potassium chloride  40 mEq Oral Daily  . sevelamer carbonate  1,600 mg Oral TID WC  . simvastatin  40 mg Oral q1800

## 2012-07-08 NOTE — Preoperative (Signed)
Beta Blockers   Reason not to administer Beta Blockers:Metoprolol not given today due to NPO. Will give Beta Blocker intraoperatively.

## 2012-07-08 NOTE — Transfer of Care (Signed)
Immediate Anesthesia Transfer of Care Note  Patient: Lori Greer  Procedure(s) Performed: Procedure(s):  DIAGNOSTIC LAPAROSCOPY LAPAROSCOPIC REPOSITIONING CAPD CATHETER (N/A)  Patient Location: PACU  Anesthesia Type:General  Level of Consciousness: oriented, sedated, patient cooperative and responds to stimulation  Airway & Oxygen Therapy: Patient Spontanous Breathing and Patient connected to nasal cannula oxygen  Post-op Assessment: Report given to PACU RN, Post -op Vital signs reviewed and stable, Patient moving all extremities and Patient moving all extremities X 4  Post vital signs: Reviewed and stable  Complications: No apparent anesthesia complications

## 2012-07-08 NOTE — Anesthesia Procedure Notes (Signed)
Procedure Name: Intubation Date/Time: 07/08/2012 6:30 PM Performed by: Charm Barges, Zaylee Cornia R Pre-anesthesia Checklist: Patient identified, Emergency Drugs available, Suction available, Patient being monitored and Timeout performed Patient Re-evaluated:Patient Re-evaluated prior to inductionOxygen Delivery Method: Circle system utilized Preoxygenation: Pre-oxygenation with 100% oxygen Intubation Type: IV induction Ventilation: Mask ventilation without difficulty Laryngoscope Size: Mac and 3 Grade View: Grade I Tube type: Oral Tube size: 7.5 mm Number of attempts: 1 Airway Equipment and Method: Stylet Placement Confirmation: ETT inserted through vocal cords under direct vision,  positive ETCO2 and breath sounds checked- equal and bilateral Secured at: 23 cm Tube secured with: Tape Dental Injury: Teeth and Oropharynx as per pre-operative assessment

## 2012-07-08 NOTE — Progress Notes (Signed)
  Subjective: Awaiting PD cath repostioning, she is NPO and has not eaten. She has been receiving plavix Objective: Vital signs in last 24 hours: Temp:  [98.2 F (36.8 C)-99.4 F (37.4 C)] 98.8 F (37.1 C) (06/16 0530) Pulse Rate:  [73-87] 73 (06/16 0530) Resp:  [18-20] 18 (06/16 0530) BP: (109-133)/(64-72) 109/65 mmHg (06/16 0530) SpO2:  [98 %-100 %] 100 % (06/16 0530) Weight:  [78.427 kg (172 lb 14.4 oz)] 78.427 kg (172 lb 14.4 oz) (06/15 2103) Last BM Date: 07/07/12  Intake/Output from previous day: 06/15 0701 - 06/16 0700 In: 840 [P.O.:840] Out: 0  Intake/Output this shift:    General appearance: alert, cooperative and no distress  Lab Results:   Recent Labs  07/06/12 0834 07/07/12 0700  WBC 4.8 4.9  HGB 8.1* 9.2*  HCT 23.4* 26.2*  PLT 164 173    BMET  Recent Labs  07/06/12 0834 07/07/12 0700  NA 140 140  K 3.0* 3.9  CL 108 107  CO2 21 26  GLUCOSE 91 118*  BUN 61* 39*  CREATININE 7.44* 4.96*  CALCIUM 8.4 9.1   PT/INR No results found for this basename: LABPROT, INR,  in the last 72 hours   Recent Labs Lab 07/06/12 0834 07/07/12 0700  ALBUMIN 2.5* 2.9*     Lipase  No results found for this basename: lipase     Studies/Results: No results found.  Medications: . aspirin  81 mg Oral Daily  .  ceFAZolin (ANCEF) IV  3 g Intravenous On Call to OR  . clopidogrel  75 mg Oral Daily  . darbepoetin (ARANESP) injection - DIALYSIS  100 mcg Intravenous Q Fri-HD  . lactulose  20 g Oral TID  . metoprolol succinate  50 mg Oral Daily  . multivitamin  1 tablet Oral QHS  . potassium chloride  40 mEq Oral Daily  . sevelamer carbonate  1,600 mg Oral TID WC  . simvastatin  40 mg Oral q1800    Assessment/Plan 1. 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 Hx of Hidradenitis  8. Body mass  index is 32.1  Plan:  I will hold her Plavix this AM she is already NPO.  Nurse and patient report she has a Consent signed already.   LOS: 3 days    Lori Greer 07/08/2012

## 2012-07-08 NOTE — Progress Notes (Signed)
Dr Derrell Lolling to reposition pd cath late afternoon 6.16  Lori Greer M. Andrey Campanile, MD, FACS General, Bariatric, & Minimally Invasive Surgery [facility], [place]

## 2012-07-08 NOTE — Op Note (Signed)
Pre Operative Diagnosis:  CAPD catheter malposition  Post Operative Diagnosis: same  Procedure: dx laparoscopy and reposition of CAPD catheter  Surgeon: Dr. Axel Filler  Assistant: none  Anesthesia: GETA  EBL: <5cc  Complications: none3  Counts: reported as correct x 2  Findings:  Pt is her PD catheter coiled in omentum in the RUQ area.  The omentum was interwoven into the coil of the catheter.  The catheter was irrgated with 500cc of sterile saline and all 500cc were evacuated out with ease.  The catheter was attached to the peritoneum with a 3-0 silk stitch.  Teh catheter tip lay in the pelvis.  Indications for procedure:  Pt is a 42 y/o F with a h/o ESRD on PD.  Pt was admitted after malfunctioning of her PD catheter.  After appropriate HD prior to the OR, she was taken back for diagnostic laparoscopy and repositioning of her CAPD.  Details of the procedure:The patient was taken back to the operating room. The patient was placed in supine position with bilateral SCDs in place. After appropriate anitbiotics were confirmed, a time-out was confirmed and all facts were verified.  I Veress needle technique was used to insulfate the abdomen to .  Subsequent to this a 5mm trocar and camera was introduced intot he abdomen.  A 2nd and 3rd 5mm trochar was introduced into the abdomen under direct visulalization.  At this time it was noted the CAPD catheter was coiled around some omentum in the LUQ.  The omentum was peeled off the CAPD catheter.  The catheter was repositioned into the Pelvis. ONcce there it was secured to the peritoneum via  3-0 silk stitch x 1.  500cc of sterile saline was used to infiltrate the abdomen and evacuated completely without any resistance.  The gas was evacuated and the trochars removed.  The skin was reapproximated with 4-0 monocryl stitches.  The skin was dressed with steri-strips and 2x2's.  The CAPD catheter tip was resterilized and the  patient was taken to  the recovery room in stable condition.

## 2012-07-08 NOTE — Anesthesia Preprocedure Evaluation (Addendum)
Anesthesia Evaluation  Patient identified by MRN, date of birth, ID band Patient awake    Reviewed: Allergy & Precautions, H&P , NPO status , Patient's Chart, lab work & pertinent test results  Airway Mallampati: II TM Distance: >3 FB Neck ROM: Full    Dental  (+) Edentulous Upper, Edentulous Lower and Dental Advisory Given   Pulmonary  breath sounds clear to auscultation        Cardiovascular hypertension, Pt. on medications and Pt. on home beta blockers + CAD and + Peripheral Vascular Disease Rhythm:Regular Rate:Normal     Neuro/Psych    GI/Hepatic   Endo/Other  diabetes, Well Controlled, Type 2, Insulin Dependent  Renal/GU ESRF and DialysisRenal disease     Musculoskeletal   Abdominal   Peds  Hematology   Anesthesia Other Findings   Reproductive/Obstetrics                          Anesthesia Physical Anesthesia Plan  ASA: III  Anesthesia Plan: General   Post-op Pain Management:    Induction: Intravenous  Airway Management Planned: Oral ETT  Additional Equipment:   Intra-op Plan:   Post-operative Plan: Extubation in OR  Informed Consent: I have reviewed the patients History and Physical, chart, labs and discussed the procedure including the risks, benefits and alternatives for the proposed anesthesia with the patient or authorized representative who has indicated his/her understanding and acceptance.   Dental advisory given  Plan Discussed with: CRNA, Anesthesiologist and Surgeon  Anesthesia Plan Comments:         Anesthesia Quick Evaluation

## 2012-07-08 NOTE — Anesthesia Postprocedure Evaluation (Signed)
Anesthesia Post Note  Patient: Lori Greer  Procedure(s) Performed: Procedure(s) (LRB):  DIAGNOSTIC LAPAROSCOPY LAPAROSCOPIC REPOSITIONING CAPD CATHETER (N/A)  Anesthesia type: general  Patient location: PACU  Post pain: Pain level controlled  Post assessment: Patient's Cardiovascular Status Stable  Last Vitals:  Filed Vitals:   07/08/12 1951  BP: 123/57  Pulse: 74  Temp: 36.9 C  Resp: 13    Post vital signs: Reviewed and stable  Level of consciousness: sedated  Complications: No apparent anesthesia complications

## 2012-07-09 ENCOUNTER — Other Ambulatory Visit: Payer: Self-pay | Admitting: *Deleted

## 2012-07-09 ENCOUNTER — Encounter (HOSPITAL_COMMUNITY): Payer: Self-pay | Admitting: General Surgery

## 2012-07-09 ENCOUNTER — Encounter: Payer: Self-pay | Admitting: *Deleted

## 2012-07-09 MED ORDER — CALCITRIOL 0.5 MCG PO CAPS: 1.0000 ug | ORAL_CAPSULE | Freq: Every day | ORAL | Status: DC

## 2012-07-09 MED ORDER — HYDROMORPHONE HCL PF 1 MG/ML IJ SOLN: 1.0000 mg | INTRAMUSCULAR | Status: DC | PRN

## 2012-07-09 MED ORDER — EPOETIN ALFA 10000 UNIT/ML IJ SOLN: 10000.0000 [IU] | INTRAMUSCULAR | Status: DC

## 2012-07-09 MED ORDER — ONDANSETRON HCL 4 MG/2ML IJ SOLN: 4.0000 mg | Freq: Four times a day (QID) | INTRAMUSCULAR | Status: DC | PRN

## 2012-07-09 MED ORDER — DELFLEX-LC/1.5% DEXTROSE 346 MOSM/L IP SOLN
INTRAPERITONEAL | Status: DC
  Administered 2012-07-09: 2000 mL via INTRAPERITONEAL
  Administered 2012-07-09: 12:00:00 via INTRAPERITONEAL

## 2012-07-09 MED ORDER — HYDROCODONE-ACETAMINOPHEN 5-325 MG PO TABS: 1.0000 | ORAL_TABLET | ORAL | Status: DC | PRN

## 2012-07-09 NOTE — Progress Notes (Signed)
1 Day Post-Op  Subjective: PT doing well today. No abd soreness.  No c/o  Objective: Vital signs in last 24 hours: Temp:  [97.8 F (36.6 C)-99.5 F (37.5 C)] 99.5 F (37.5 C) (06/17 0530) Pulse Rate:  [65-89] 83 (06/17 0530) Resp:  [11-19] 18 (06/17 0530) BP: (88-150)/(47-80) 97/48 mmHg (06/17 0530) SpO2:  [96 %-100 %] 100 % (06/17 0530) Weight:  [166 lb (75.297 kg)-173 lb 11.6 oz (78.8 kg)] 166 lb (75.297 kg) (06/17 0103) Last BM Date: 07/07/12  Intake/Output from previous day: 06/16 0701 - 06/17 0700 In: 200 [I.V.:200] Out: 3025 [Blood:25] Intake/Output this shift: Total I/O In: 150 [I.V.:150] Out: 3025 [Other:3000; Blood:25]  General appearance: alert and cooperative GI: wound c/d/i, CAPD catheter ok  Lab Results:   Recent Labs  07/07/12 0700 07/08/12 2100  WBC 4.9 5.9  HGB 9.2* 8.3*  HCT 26.2* 23.3*  PLT 173 137*   BMET  Recent Labs  07/07/12 0700 07/08/12 2100  NA 140 139  K 3.9 3.8  CL 107 108  CO2 26 20  GLUCOSE 118* 81  BUN 39* 46*  CREATININE 4.96* 5.81*  CALCIUM 9.1 8.9   PT/INR No results found for this basename: LABPROT, INR,  in the last 72 hours ABG No results found for this basename: PHART, PCO2, PO2, HCO3,  in the last 72 hours  Studies/Results: No results found.  Anti-infectives: Anti-infectives   Start     Dose/Rate Route Frequency Ordered Stop   07/08/12 0600  ceFAZolin (ANCEF) 3 g in dextrose 5 % 50 mL IVPB     3 g 160 mL/hr over 30 Minutes Intravenous On call to O.R. 07/07/12 1630 07/08/12 1818   07/06/12 0600  ceFAZolin (ANCEF) IVPB 2 g/50 mL premix    Comments:  Pharmacy may adjust dosing strength, interval, or rate of medication as needed for optimal therapy for the patient  Send with patient on call to the OR.  Anesthesia to complete antibiotic administration <58min prior to incision per [facility].   2 g 100 mL/hr over 30 Minutes Intravenous On call to O.R. 07/05/12 1448 07/06/12 0734      Assessment/Plan: s/p  Procedure(s):  DIAGNOSTIC LAPAROSCOPY LAPAROSCOPIC REPOSITIONING CAPD CATHETER (N/A) OK for DC from my standpoint. OK to start using PD catheter as soon as needed. Pt OK to f/u in 2 weeks with me   LOS: 4 days    Marigene Ehlers., [facility] 07/09/2012

## 2012-07-09 NOTE — Progress Notes (Signed)
Left femoral HD catheter d/c"d, pressure dreesing applied for 20 min. No active bleeding noted, instructed to lie down for 30 min. RN made aware and to check patient femoral site before discharge.

## 2012-07-09 NOTE — Progress Notes (Signed)
[facility] Progress Note  Subjective:   Feels good; would like to go home  Objective Filed Vitals:   07/09/12 0055 07/09/12 0103 07/09/12 0530 07/09/12 0903  BP: 93/47 88/53 97/48  96/67  Pulse: 86 84 83 84  Temp:  98.7 F (37.1 C) 99.5 F (37.5 C) 99.1 F (37.3 C)  TempSrc:  Oral Oral Oral  Resp:  18 18 18   Height:      Weight:  75.297 kg (166 lb)    SpO2:  100% 100% 100%   Physical Exam General: NAD, up and about with ease Heart: RRR Lungs: no wheezes or rales Abdomen: soft NT Extremities: no LE edmea Dialysis Access: PD cath; also left temp fem cath in place  Dialysis Orders: CAPD x 7d/wk EDW 73kg 3 daytime exchanges (dwell time 4 hrs) and 1 nighttime exchange (dwell time 8 hrs) with fill vol 2   Assessment/Plan:  1. Poorly functioning PD catheter - no movement of fluid in or out on 6/13 (per nurse), x-ray showed catheter coiled in right midline of abdomen; laparoscopic repositioning on 6/16 per Surgery - Discussed with [facility] NP who confirmed with Dr. Harden Mo, that it would be ok for her to resme CAPD.  Plan 1 exchange here prior to d/c to confirm no problems. Orders written and discussed with her RN. If no problems, ok for d/c 2. CAD - s/p PTCA with drug-eluting stent placed @ LAD by Dr. [place] 02/23/12; on Plavix (last taken yesterday), may continue Plavix and ASA per Cardiology. 3. Hypokalemia - K 3.8 on KCl @ 40 mEq bid   4. ESRD - CAPD, 4 exchanges 7 days a week; HD Saturday after placement of temporary L femoral catheter; had vein mapping for placement of permanent HD access. Seen by Dr. Hart Rochester - plan to have a left BC AVF afterh the 4th of July. Ok for CAPD as above. Removed temporary catheter 5. Hypertension/volume - BP 128/65 Metoprolol 50 mg qd;  yesterday, EDW 73 kg - UF 3 L on HD yesterday 6. Anemia - Hgb 9.2 to 8.3 , no ESA or Fe prior to admission; s/p Aranesp 100 mcg with HD Friday - will notify her home training unit to increase Epo 7. Metabolic  bone disease - , P 3.7, iPTH 718 on 6/5; on Renvela 2 with meals, 1 with snacks - she needs to be started on po calcitriol - will start with 1 mcg qd. Rx given with d/c 8. Nutrition - last Alb 2.9, needs low P high protein , vitamin. 9. Dis p - d/c today if CAPD cath works and draining well.   Sheffield Slider, PA-C [facility] Beeper [phone] 07/09/2012,9:45 AM  LOS: 4 days   Patient seen and examined.  Agree with assessment and plan as above. [name]  MD [phone] pgr    [phone] cell 07/09/2012, 1:21 PM   Additional Objective Labs: Basic Metabolic Panel:  Recent Labs Lab 07/06/12 0834 07/07/12 0700 07/08/12 2100  NA 140 140 139  K 3.0* 3.9 3.8  CL 108 107 108  CO2 21 26 20   GLUCOSE 91 118* 81  BUN 61* 39* 46*  CREATININE 7.44* 4.96* 5.81*  CALCIUM 8.4 9.1 8.9  PHOS 5.2* 3.7 4.3   Liver Function Tests:  Recent Labs Lab 07/06/12 0834 07/07/12 0700 07/08/12 2100  ALBUMIN 2.5* 2.9* 2.6*   CBC:  Recent Labs Lab 07/04/12 2149 07/06/12 0834 07/07/12 0700 07/08/12 2100  WBC 5.8 4.8 4.9 5.9  NEUTROABS 2.7  --   --   --  HGB 8.6* 8.1* 9.2* 8.3*  HCT 24.1* 23.4* 26.2* 23.3*  MCV 88.0 88.6 89.1 88.3  PLT 160 164 173 137*   Blood Culture    Component Value Date/Time   SDES PERITONEAL CAVITY 06/21/2012 0108   SPECREQUEST Normal 06/21/2012 0108   CULT NO GROWTH 3 DAYS 06/21/2012 0108   REPTSTATUS 06/24/2012 FINAL 06/21/2012 0108  CBG:  Recent Labs Lab 07/08/12 2001  GLUCAP 76   IMedications: . sodium chloride Stopped (07/08/12 1945)   . aspirin  81 mg Oral Daily  . calcitRIOL  1 mcg Oral Daily  . clopidogrel  75 mg Oral Daily  . darbepoetin (ARANESP) injection - DIALYSIS  100 mcg Intravenous Q Mon-HD  . lactulose  20 g Oral TID  . metoprolol succinate  50 mg Oral Daily  . multivitamin  1 tablet Oral QHS  . potassium chloride  40 mEq Oral Daily  . sevelamer carbonate  1,600 mg Oral TID WC  . simvastatin  40 mg Oral q1800

## 2012-07-09 NOTE — Discharge Summary (Signed)
Physician Discharge Summary  Patient ID: Lori Greer MRN: [id number] DOB/AGE: 1970-10-10 42 y.o.  Admit date: 07/05/2012 Discharge date: 07/09/2012  Discharge Diagnoses:  Poorly functional PD catheter - repositioned - resolved ESRD Type 2 DM CAD s/p PTCA with drug elutiing stent LAD 02/23/12 Hypertension Anemia Metabolic Bone Disease  Discharged Condition: good  HPI: Lori Greer is a 42 y.o. female with ESRD on peritoneal dialysis who had decreased output from her peritoneal drain for the last week. She contacted Home [facility] on [employer] on Monday 6/9 when she could not do her daytime exchanges, and completed her 8-hour nighttime exchange using Heparin 2 cc/2 L. She had been instructed to use lactulose 2 tbps every two hours and presented daily to Home Training, which tried Heparin and 500-cc flushes, but she has not had any successful exchanges in four days. X-ray of her abdomen yesterday showed that her peritoneal catheter was coiled in the upper abdomen near the midline. Home training attempted repositioning today, but was unsuccessful. She was admitted for laparoscopic repositioning of her catheter, but she ate around noon today and is on Plavix for drug-eluting cardiac stents placed in January, requiring delay of procedure until Monday. The patient states that her abdomen was swollen and tight earlier in the week, but improved with more frequent urination. She feels well and has no complaints, but will have a temporary catheter placed today for hemodialysis.   Hospital Course: Lori Greer was admitted for evaluation of poorly functional dialysis catheter.  Placement of a temporary femoral catheter was unsuccessful 6/13 and then successfully placed 6/14.  She received hemodialysis 6/14 and again 6/16 with UF volumes of 3.9 L and 3 L respectively.  Cardiology was consulted for cardiac clearance before surgery and to evaluate hold plavix prior to surgery given that she has a drug  eluting stent placed January 2014. She was cleared to hold plavix. Her PD cath was repositioned 6/16.  She did well postoperatively and Dr. Antonieta Pert felt she was safe to resume her CAPD with 2 L fills.  She had a trial fill  Of 2 L dialysate on 6/17 and successfully drained thereafter and was discharged Home. Hgb had been drifting down as an outpatientt; She was dosed with Aranesp while here and will be discharged on Epogen 10k TIW.  iPTH was also noted to be high (718) on her June labs, therefore, calcitriol was started at 1 mcg per day and will be monitored per protocol at [facility]. She also was seen by Dr. Hart Rochester while here and had vein mapping. Plans are for her to have an AVF placed the second week of July to be used as a back up access; [facility] will call her for an appointment  D/c Labs: 6/16 Na 139 K 3.9, Cl 109, CO2 20 BUN 46 Cr 5.8 Gl 81, P 4.3, Alb 2.6 Hgb 8.3, WBC 5.9 platelets 137,000.  Consults: Dr Magnus Ivan, [facility]                   Dr. Hart Rochester, [facility]                   Dr. Antoine Poche, [facility]  Procedures/Treatments:   Placement of left femoral temporary catheter 07/06/12 - Dr. Arrie Aran  Hemodialysis  Diagnostic laparoscopy and reposition of CAPD catheter - Dr. Derrell Lolling 07/08/12  Discharge Exam: Blood pressure 98/61, pulse 75, temperature 99 F (37.2 C), temperature source Oral, resp. rate 17, height 5\' 2"  (1.575 m), weight 75.297 kg (  166 lb), SpO2 100.00%.   Disposition: 01-Home or Self Care  Discharge Orders   Future Appointments Provider Department Dept Phone   07/24/2012 4:40 PM Axel Filler, MD [facility], [place] [phone]   Future Orders Complete By Expires     Activity as tolerated  As directed     Avoid straining  As directed     Bring all medications to your doctor's appointment  As directed     CALL 911 for chest discomfort not relieved by NTG or lasting longer than 20 minutes  As directed     Call doctor for chest discomfort that is more frequent  or severe  As directed     Call doctor for fainting or near blackouts  As directed     Call doctor for shortness of breath, with or without a dry hacking cough  As directed     Call doctor if you have to sleep on extra pillows at night in order to breathe  As directed     No Wound Care  As directed     STOP ANY ACTIVITY THAT CAUSES CHEST PAIN, SHORTNESS OF BREATH, DIZZINESS, SWEATING OR EXCESSIVE WEAKNESS  As directed     renal diet  As directed         Medication List    TAKE these medications       aspirin 81 MG EC tablet  Take 1 tablet (81 mg total) by mouth daily.     bisacodyl 5 MG EC tablet  Commonly known as:  DULCOLAX  Take 5 mg by mouth daily as needed for constipation.     calcitRIOL 0.5 MCG capsule  Commonly known as:  ROCALTROL  Take 2 capsules (1 mcg total) by mouth daily.     clopidogrel 75 MG tablet  Commonly known as:  PLAVIX  Take 1 tablet (75 mg total) by mouth daily with breakfast.     epoetin alfa 09811 UNIT/ML injection  Commonly known as:  EPOGEN  Inject 1 mL (10,000 Units total) into the skin 3 (three) times a week.     folic acid-vitamin b complex-vitamin c-selenium-zinc 3 MG Tabs  Take 1 tablet by mouth daily.     metoprolol succinate 50 MG 24 hr tablet  Commonly known as:  TOPROL-XL  Take 50 mg by mouth daily.     polyethylene glycol powder powder  Commonly known as:  GLYCOLAX/MIRALAX  Take 17 g by mouth daily.     potassium chloride SA 20 MEQ tablet  Commonly known as:  K-DUR,KLOR-CON  Take 20 mEq by mouth daily.     pravastatin 40 MG tablet  Commonly known as:  PRAVACHOL  Take 40 mg by mouth every evening.     sevelamer carbonate 800 MG tablet  Commonly known as:  RENVELA  Take 1,600 mg by mouth 3 (three) times daily with meals. Takes 1600mg  with each meal, and 800mg  with each snack.           Follow-up Information   Follow up with Josephina Gip, MD In 3 weeks. (Office will call you to schedule your surgery for after July 4th.)     Contact information:   [address] [place] [address] [phone]       Follow up with Marigene Ehlers., Jed Limerick, MD. Schedule an appointment as soon as possible for a visit in 2 weeks.   Contact information:   [address] [place] [address] [phone]  Follow up with Home [facility] at [facility]  - they will call you for a follow up appointment.  35 minutes were spent completing discharge summary and convey discharge information to her dialysis center.  Signed: Sheffield SliderPA-C 07/09/2012, 3:40 PM Attending: Dr. Delano Metz  Patient seen and examined.  Agree with assessment and plan as above. [name]  MD [phone] pgr    [phone] cell 07/10/2012, 10:07 AM

## 2012-07-12 ENCOUNTER — Encounter (HOSPITAL_COMMUNITY): Payer: Self-pay | Admitting: Emergency Medicine

## 2012-07-12 ENCOUNTER — Telehealth (INDEPENDENT_AMBULATORY_CARE_PROVIDER_SITE_OTHER): Payer: Self-pay

## 2012-07-12 ENCOUNTER — Emergency Department (HOSPITAL_COMMUNITY): Payer: Medicare Other

## 2012-07-12 ENCOUNTER — Inpatient Hospital Stay (HOSPITAL_COMMUNITY)
Admission: EM | Admit: 2012-07-12 | Discharge: 2012-07-16 | DRG: 907 | Disposition: A | Discharge status: Home / Self Care | Payer: Medicare Other | Attending: Nephrology | Admitting: Nephrology

## 2012-07-12 DIAGNOSIS — Z9861 Coronary angioplasty status: Secondary | ICD-10-CM

## 2012-07-12 DIAGNOSIS — E11359 Type 2 diabetes mellitus with proliferative diabetic retinopathy without macular edema: Secondary | ICD-10-CM | POA: Diagnosis present

## 2012-07-12 DIAGNOSIS — T85611A Breakdown (mechanical) of intraperitoneal dialysis catheter, initial encounter: Secondary | ICD-10-CM

## 2012-07-12 DIAGNOSIS — E1139 Type 2 diabetes mellitus with other diabetic ophthalmic complication: Secondary | ICD-10-CM | POA: Diagnosis present

## 2012-07-12 DIAGNOSIS — N186 End stage renal disease: Secondary | ICD-10-CM | POA: Diagnosis present

## 2012-07-12 DIAGNOSIS — Z7982 Long term (current) use of aspirin: Secondary | ICD-10-CM

## 2012-07-12 DIAGNOSIS — Z87891 Personal history of nicotine dependence: Secondary | ICD-10-CM

## 2012-07-12 DIAGNOSIS — Z6833 Body mass index (BMI) 33.0-33.9, adult: Secondary | ICD-10-CM

## 2012-07-12 DIAGNOSIS — F909 Attention-deficit hyperactivity disorder, unspecified type: Secondary | ICD-10-CM | POA: Diagnosis present

## 2012-07-12 DIAGNOSIS — I2510 Atherosclerotic heart disease of native coronary artery without angina pectoris: Secondary | ICD-10-CM | POA: Diagnosis present

## 2012-07-12 DIAGNOSIS — T85691A Other mechanical complication of intraperitoneal dialysis catheter, initial encounter: Principal | ICD-10-CM | POA: Diagnosis present

## 2012-07-12 DIAGNOSIS — N2581 Secondary hyperparathyroidism of renal origin: Secondary | ICD-10-CM

## 2012-07-12 DIAGNOSIS — N736 Female pelvic peritoneal adhesions (postinfective): Secondary | ICD-10-CM | POA: Diagnosis present

## 2012-07-12 DIAGNOSIS — D649 Anemia, unspecified: Secondary | ICD-10-CM

## 2012-07-12 DIAGNOSIS — Z4902 Encounter for fitting and adjustment of peritoneal dialysis catheter: Secondary | ICD-10-CM

## 2012-07-12 DIAGNOSIS — E669 Obesity, unspecified: Secondary | ICD-10-CM | POA: Diagnosis present

## 2012-07-12 DIAGNOSIS — Z992 Dependence on renal dialysis: Secondary | ICD-10-CM

## 2012-07-12 DIAGNOSIS — I120 Hypertensive chronic kidney disease with stage 5 chronic kidney disease or end stage renal disease: Secondary | ICD-10-CM | POA: Diagnosis present

## 2012-07-12 HISTORY — DX: End stage renal disease: N18.6

## 2012-07-12 LAB — CBC WITH DIFFERENTIAL/PLATELET
Basophils Absolute: 0 10*3/uL (ref 0.0–0.1)
Basophils Relative: 0 % (ref 0–1)
Eosinophils Absolute: 0.3 10*3/uL (ref 0.0–0.7)
Eosinophils Relative: 5 % (ref 0–5)
HCT: 24.7 % — ABNORMAL LOW (ref 36.0–46.0)
Hemoglobin: 8.9 g/dL — ABNORMAL LOW (ref 12.0–15.0)
Lymphocytes Relative: 35 % (ref 12–46)
Lymphs Abs: 2.3 10*3/uL (ref 0.7–4.0)
MCH: 32.1 pg (ref 26.0–34.0)
MCHC: 36 g/dL (ref 30.0–36.0)
MCV: 89.2 fL (ref 78.0–100.0)
Monocytes Absolute: 0.6 10*3/uL (ref 0.1–1.0)
Monocytes Relative: 9 % (ref 3–12)
Neutro Abs: 3.3 10*3/uL (ref 1.7–7.7)
Neutrophils Relative %: 51 % (ref 43–77)
Platelets: 185 10*3/uL (ref 150–400)
RBC: 2.77 MIL/uL — ABNORMAL LOW (ref 3.87–5.11)
RDW: 14.5 % (ref 11.5–15.5)
WBC: 6.5 10*3/uL (ref 4.0–10.5)

## 2012-07-12 LAB — BODY FLUID CELL COUNT WITH DIFFERENTIAL
Eos, Fluid: 11 %
Lymphs, Fluid: 10 %
Monocyte-Macrophage-Serous Fluid: 49 % — ABNORMAL LOW (ref 50–90)
Neutrophil Count, Fluid: 30 % — ABNORMAL HIGH (ref 0–25)
Total Nucleated Cell Count, Fluid: 8 cu mm (ref 0–1000)

## 2012-07-12 LAB — BASIC METABOLIC PANEL
BUN: 38 mg/dL — ABNORMAL HIGH (ref 6–23)
CO2: 26 mEq/L (ref 19–32)
Calcium: 8.9 mg/dL (ref 8.4–10.5)
Chloride: 101 mEq/L (ref 96–112)
Creatinine, Ser: 7.08 mg/dL — ABNORMAL HIGH (ref 0.50–1.10)
GFR calc Af Amer: 7 mL/min — ABNORMAL LOW (ref >90)
GFR calc non Af Amer: 6 mL/min — ABNORMAL LOW (ref >90)
Glucose, Bld: 89 mg/dL (ref 70–99)
Potassium: 3.9 mEq/L (ref 3.5–5.1)
Sodium: 138 mEq/L (ref 135–145)

## 2012-07-12 LAB — GLUCOSE, CAPILLARY
Glucose-Capillary: 59 mg/dL — ABNORMAL LOW (ref 70–99)
Glucose-Capillary: 70 mg/dL (ref 70–99)

## 2012-07-12 MED ORDER — CALCITRIOL 0.5 MCG PO CAPS
1.0000 ug | ORAL_CAPSULE | Freq: Every day | ORAL | Status: DC
  Administered 2012-07-12 – 2012-07-16 (×5): 1 ug via ORAL
  Filled 2012-07-12 (×5): qty 2

## 2012-07-12 MED ORDER — HEPARIN SODIUM (PORCINE) 1000 UNIT/ML IJ SOLN
1500.0000 [IU] | INTRAMUSCULAR | Status: DC
  Administered 2012-07-12: 1500 [IU] via INTRAPERITONEAL
  Filled 2012-07-12 (×2): qty 1.5

## 2012-07-12 MED ORDER — POLYETHYLENE GLYCOL 3350 17 GM/SCOOP PO POWD
17.0000 g | Freq: Every day | ORAL | Status: DC
  Administered 2012-07-14: 17 g via ORAL
  Filled 2012-07-12 (×2): qty 255

## 2012-07-12 MED ORDER — ONDANSETRON HCL 4 MG/2ML IJ SOLN: 4.0000 mg | Freq: Four times a day (QID) | INTRAMUSCULAR | Status: DC | PRN

## 2012-07-12 MED ORDER — CLOPIDOGREL BISULFATE 75 MG PO TABS
75.0000 mg | ORAL_TABLET | Freq: Every day | ORAL | Status: DC
  Administered 2012-07-13 – 2012-07-16 (×4): 75 mg via ORAL
  Filled 2012-07-12 (×6): qty 1

## 2012-07-12 MED ORDER — INSULIN ASPART 100 UNIT/ML ~~LOC~~ SOLN: 0.0000 [IU] | Freq: Every day | SUBCUTANEOUS | Status: DC

## 2012-07-12 MED ORDER — SEVELAMER CARBONATE 800 MG PO TABS
1600.0000 mg | ORAL_TABLET | Freq: Three times a day (TID) | ORAL | Status: DC
  Administered 2012-07-12 – 2012-07-15 (×8): 1600 mg via ORAL
  Filled 2012-07-12 (×13): qty 2

## 2012-07-12 MED ORDER — INSULIN ASPART 100 UNIT/ML ~~LOC~~ SOLN
0.0000 [IU] | Freq: Three times a day (TID) | SUBCUTANEOUS | Status: DC
  Administered 2012-07-14: 2 [IU] via SUBCUTANEOUS

## 2012-07-12 MED ORDER — POTASSIUM CHLORIDE CRYS ER 20 MEQ PO TBCR
20.0000 meq | EXTENDED_RELEASE_TABLET | Freq: Every day | ORAL | Status: DC
  Administered 2012-07-12 – 2012-07-16 (×5): 20 meq via ORAL
  Filled 2012-07-12 (×5): qty 1

## 2012-07-12 MED ORDER — BISACODYL 10 MG RE SUPP
10.0000 mg | Freq: Every day | RECTAL | Status: DC | PRN
  Administered 2012-07-15: 10 mg via RECTAL
  Filled 2012-07-12: qty 1

## 2012-07-12 MED ORDER — DIALYVITE 3000 3 MG PO TABS: 1.0000 | ORAL_TABLET | Freq: Every day | ORAL | Status: DC

## 2012-07-12 MED ORDER — RENA-VITE PO TABS
1.0000 | ORAL_TABLET | Freq: Every day | ORAL | Status: DC
  Administered 2012-07-12 – 2012-07-16 (×5): 1 via ORAL
  Filled 2012-07-12 (×5): qty 1

## 2012-07-12 MED ORDER — HEPARIN SODIUM (PORCINE) 1000 UNIT/ML IJ SOLN
500.0000 [IU] | INTRAMUSCULAR | Status: DC
  Filled 2012-07-12: qty 0.5

## 2012-07-12 MED ORDER — ASPIRIN EC 81 MG PO TBEC
81.0000 mg | DELAYED_RELEASE_TABLET | Freq: Every day | ORAL | Status: DC
  Administered 2012-07-12 – 2012-07-16 (×5): 81 mg via ORAL
  Filled 2012-07-12 (×5): qty 1

## 2012-07-12 MED ORDER — SEVELAMER CARBONATE 800 MG PO TABS: 1600.0000 mg | ORAL_TABLET | Freq: Three times a day (TID) | ORAL | Status: DC

## 2012-07-12 MED ORDER — ACETAMINOPHEN 650 MG RE SUPP: 650.0000 mg | Freq: Four times a day (QID) | RECTAL | Status: DC | PRN

## 2012-07-12 MED ORDER — METOPROLOL SUCCINATE ER 50 MG PO TB24
50.0000 mg | ORAL_TABLET | Freq: Every day | ORAL | Status: DC
  Administered 2012-07-12 – 2012-07-16 (×6): 50 mg via ORAL
  Filled 2012-07-12 (×6): qty 1

## 2012-07-12 MED ORDER — ENOXAPARIN SODIUM 30 MG/0.3ML ~~LOC~~ SOLN
30.0000 mg | SUBCUTANEOUS | Status: DC
  Administered 2012-07-12: 30 mg via SUBCUTANEOUS
  Filled 2012-07-12 (×2): qty 0.3

## 2012-07-12 MED ORDER — ONDANSETRON HCL 4 MG PO TABS: 4.0000 mg | ORAL_TABLET | Freq: Four times a day (QID) | ORAL | Status: DC | PRN

## 2012-07-12 MED ORDER — ACETAMINOPHEN 325 MG PO TABS: 650.0000 mg | ORAL_TABLET | Freq: Four times a day (QID) | ORAL | Status: DC | PRN

## 2012-07-12 MED ORDER — DEXTROSE 50 % IV SOLN
INTRAVENOUS | Status: AC
  Filled 2012-07-12: qty 50

## 2012-07-12 NOTE — Telephone Encounter (Signed)
The kidney ctr called to report that the patient had her pd cath manipulated MOnday and today her abdomen is swollen and she is not draining the fluid well.  She is afraid the fluid is leaking into the abdomen.  I paged Dr Derrell Lolling and he said she needs to go to [facility] ER and the nephrologist will have to admit her.  Dr Derrell Lolling will have to take care of her this weekend.  She may need a temporary access.  I called Angelique Blonder back with the recommendation and she states the patient has a temporary access and will let the patient know.

## 2012-07-12 NOTE — ED Notes (Signed)
Nephrology at bedside

## 2012-07-12 NOTE — ED Provider Notes (Signed)
History     CSN: [id number]  Arrival date & time 07/12/12  4098   First MD Initiated Contact with Patient 07/12/12 1000      Chief Complaint  Patient presents with  . Bloated    (Consider location/radiation/quality/duration/timing/severity/associated sxs/prior treatment) HPI Comments: Patient with history of ESRD on peritoneal dialysis presents with complaint of right-sided abdominal swelling. Swelling started yesterday when patient noted output from catheter was less than input. Patient states that she typically puts 2000 cc of dialysate solution in. During cycle yesterday she only got 1100 cc out and refilled overnight with 1100. This morning she only had 300 cc out. She went to her dialysis center on [facility] and was told to come to the emergency department for evaluation. Patient was admitted to the hospital last week and had catheter repositioning on 06/16 performed by general surgery (Dr. Derrell Lolling). Catheter functioned normally after this. She denies fever, abdominal pain, vomiting. She denies shortness of breath or lower extremity edema. Onset of symptoms gradual. Course constant. Nephrologist is Dr. Lowell Guitar.   The history is provided by the patient and medical records.    Past Medical History  Diagnosis Date  . Bell's palsy 05/12/2008  . DIABETES MELLITUS, TYPE II 03/16/2008  . HYPERTENSION 01/06/2010  . OBESITY 03/16/2008  . Proliferative diabetic retinopathy(362.02) 04/23/2009  . STASIS DERMATITIS 07/06/2009  . Fibroid, uterine   . ESRD (end stage renal disease)     ESRD on HD  . Anemia   . CAD (coronary artery disease) 01/2012    a. Cardiac cath done for abn nuc 02/23/2012 - 2 vessel CAD s/p DES to mid-LAD, residual disease in the PL OM for medical therapy.     Past Surgical History  Procedure Laterality Date  . Appendectomy  2012  . Uterine fibroid surgery    . Abdominal hysterectomy    . Abcess drainage  2002    Right axilla  . Insertion of dialysis catheter  January  2014    Right IJ   . Capd insertion Left 04/08/2012    Procedure: LAPAROSCOPIC INSERTION CONTINUOUS AMBULATORY PERITONEAL DIALYSIS  (CAPD) CATHETER;  Surgeon: Axel Filler, MD;  Location: [facility] OR;  Service: General;  Laterality: Left;  . Laparoscopic repositioning capd catheter N/A 07/08/2012    Procedure:  DIAGNOSTIC LAPAROSCOPY LAPAROSCOPIC REPOSITIONING CAPD CATHETER;  Surgeon: Axel Filler, MD;  Location: [facility] OR;  Service: General;  Laterality: N/A;    Family History  Problem Relation Age of Onset  . Heart disease Mother     MI at age 49  . Diabetes Father   . Kidney failure Father   . Diabetes Sister     1 sister with diabtes  . Diabetes Brother     History  Substance Use Topics  . Smoking status: Former Smoker    Types: Cigarettes    Quit date: 03/12/2004  . Smokeless tobacco: Never Used  . Alcohol Use: Yes     Comment: rarely    OB History   Grav Para Term Preterm Abortions TAB SAB Ect Mult Living                  Review of Systems  Constitutional: Negative for fever.  HENT: Negative for sore throat and rhinorrhea.   Eyes: Negative for redness.  Respiratory: Negative for cough and shortness of breath.   Cardiovascular: Negative for chest pain and leg swelling.  Gastrointestinal: Positive for abdominal distention. Negative for nausea, vomiting, abdominal pain and diarrhea.  Genitourinary: Positive  for decreased urine volume (baseline). Negative for dysuria.  Musculoskeletal: Negative for myalgias.  Skin: Negative for rash.  Neurological: Negative for headaches.    Allergies  Review of patient's allergies indicates no known allergies.  Home Medications   Current Outpatient Rx  Name  Route  Sig  Dispense  Refill  . aspirin EC 81 MG EC tablet   Oral   Take 1 tablet (81 mg total) by mouth daily.         . bisacodyl (DULCOLAX) 5 MG EC tablet   Oral   Take 5 mg by mouth daily as needed for constipation.         . calcitRIOL (ROCALTROL) 0.5 MCG  capsule   Oral   Take 2 capsules (1 mcg total) by mouth daily.         . clopidogrel (PLAVIX) 75 MG tablet   Oral   Take 1 tablet (75 mg total) by mouth daily with breakfast.   30 tablet   11   . epoetin alfa (EPOGEN) 10000 UNIT/ML injection   Subcutaneous   Inject 1 mL (10,000 Units total) into the skin 3 (three) times a week.   1 mL      . folic acid-vitamin b complex-vitamin c-selenium-zinc (DIALYVITE) 3 MG TABS   Oral   Take 1 tablet by mouth daily.         . metoprolol succinate (TOPROL-XL) 50 MG 24 hr tablet   Oral   Take 50 mg by mouth daily.         . polyethylene glycol powder (GLYCOLAX/MIRALAX) powder   Oral   Take 17 g by mouth daily.         . potassium chloride SA (K-DUR,KLOR-CON) 20 MEQ tablet   Oral   Take 20 mEq by mouth daily.         . pravastatin (PRAVACHOL) 40 MG tablet   Oral   Take 40 mg by mouth every evening.         . sevelamer (RENVELA) 800 MG tablet   Oral   Take 1,600 mg by mouth 3 (three) times daily with meals. Takes 1600mg  with each meal, and 800mg  with each snack.           BP 147/86  Pulse 71  Temp(Src) 97.9 F (36.6 C) (Oral)  Resp 18  SpO2 96%  Physical Exam  Nursing note and vitals reviewed. Constitutional: She appears well-developed and well-nourished.  HENT:  Head: Normocephalic and atraumatic.  Eyes: Conjunctivae are normal. Right eye exhibits no discharge. Left eye exhibits no discharge.  Neck: Normal range of motion. Neck supple.  Cardiovascular: Normal rate, regular rhythm and normal heart sounds.   Pulmonary/Chest: Effort normal and breath sounds normal.  Abdominal: Soft. She exhibits distension (mainly right-sided). There is no tenderness.  PD catheter in place left-sided.   Neurological: She is alert.  Skin: Skin is warm and dry.  Psychiatric: She has a normal mood and affect.    ED Course  Procedures (including critical care time)  Labs Reviewed  CBC WITH DIFFERENTIAL - Abnormal; Notable  for the following:    RBC 2.77 (*)    Hemoglobin 8.9 (*)    HCT 24.7 (*)    All other components within normal limits  BASIC METABOLIC PANEL - Abnormal; Notable for the following:    BUN 38 (*)    Creatinine, Ser 7.08 (*)    GFR calc non Af Amer 6 (*)    GFR calc Af Denyse Dago  7 (*)    All other components within normal limits  BODY FLUID CELL COUNT WITH DIFFERENTIAL - Abnormal; Notable for the following:    Color, Fluid COLORLESS (*)    Neutrophil Count, Fluid 30 (*)    Monocyte-Macrophage-Serous Fluid 49 (*)    All other components within normal limits  BODY FLUID CULTURE   Dg Abd 1 View  07/12/2012   *RADIOLOGY REPORT*  Clinical Data: Evaluate Perez needle catheter location and stool burden  ABDOMEN - 1 VIEW  Comparison: Prior radiographs 07/06/2012  Findings: The peritoneal dialysis catheter has been repositioned from the right hemiabdomen into the anatomic pelvis.  The bowel gas pattern is nonobstructed.  Unremarkable colonic stool burden. Surgical clips noted in the right lower quadrant.  Nonspecific reticulation and thickening of the subcutaneous soft tissues on the right consistent with body wall edema.  IMPRESSION:  1.  Peritoneal dialysis catheter has read been repositioned from the right hemiabdomen to the right anatomic pelvis.  2.  Asymmetric body wall edema on the right of uncertain etiology.   Original Report Authenticated By: Malachy Moan, M.D.     1. Peritoneal dialysis catheter dysfunction, initial encounter     10:51 AM Patient seen and examined. D/w Dr. Rhunette Croft. Work-up initiated. Will ask nephrology for reccs.    Vital signs reviewed and are as follows: Filed Vitals:   07/12/12 0942  BP: 147/86  Pulse: 71  Temp: 97.9 F (36.6 C)  Resp: 18   11:19 AM Spoke with Dr. Arlean Hopping who will see in ED.   Patient to be admitted for malfunctioning peritoneal dialysis catheter.   MDM  Admission.       Renne Crigler, PA-C 07/12/12 1549

## 2012-07-12 NOTE — ED Notes (Signed)
Dr. Nanavati at bedside 

## 2012-07-12 NOTE — Progress Notes (Signed)
07/12/2012 2:17 PM  Went down to perform exchange per MD order.  I initially tried to drain patient to send fluid to lab, however, no fluid was able to be obtained.  I added heparin to the bag and instilled about 500cc of 1.5% standard dialysate.  I waited for a few moments before I attempted to drain the fluid back out.  I only obtained about 50-75cc out after this attempt.  The fluid was clear, light yellow and free from fibrin.  Pt seemed to tolerate procedure well, despite not being able to drain.  She stated that she did notice some fibrin in her bags yesterday, and had added heparin to her bag at that time.  She stated her last good drain was around 1600 yesterday.  I called Dr. Arlean Hopping as the order was to instill 2L of dialysate and I was unsure if the patient could tolerate that much being instilled without being able to drain well.  Dr. Arlean Hopping told me to stop attempts, and send what fluid I could get off from that exchange to the lab to be analyzed.  Fluid collected and sent to lab.  Will continue to monitor the situation. Eunice Blase

## 2012-07-12 NOTE — ED Notes (Signed)
Rhea Bleacher, PA at bedside. Pt states she noticed her bloating starting yesterday. Pt states "it goes in real good but don't all of it come out." Pt states tube replaced on Monday. Pt denies pain. Pt denies fever, nvd. Pt denies problems with urination. Pt alert and mentating appropriately. NAD noted at this time.

## 2012-07-12 NOTE — ED Notes (Signed)
Pt. Stated, I do peritoneal dialysis and my stomach has been bloated or swelling since yesterday, and its not draining right.  Went to the dialysis unit today and told to come here.

## 2012-07-12 NOTE — ED Notes (Signed)
Pt states that she does not have any fluids in her at this time. Revonda Standard on 6700 notified and states she will be down soon

## 2012-07-12 NOTE — ED Notes (Signed)
Pt taken to radiology

## 2012-07-12 NOTE — H&P (Signed)
Lori Greer 07/12/2012 Lori Greer D Requesting Physician:  Dr Rhunette Croft  Reason for Consult:  ESRD pt with PD cath not draining well HPI: The patient is a 42 y.o. year-old with ESRD on PD x approx 1 year, doing CAPD.  Was admitted last week with poor draining of cath, had temp cath for HD x 2 and on 6/15 went to OR for repositioning of PD cath done by Dr Grayling Congress.  The cath was in LUQ attached to omentum and was returned to pelvic region and stitched to peritoneum.  It drained and flushed well. Pt sent home, temp HD cath removed.  Returning to ED reporting last 24hrs poor drainage, draining about 50% of fill.  "It just stops".  No cloudy fluid or abd pain, +abd swelling R upper abdomen.    Just now in ED, we instilled 500 cc into abdomen and less than 5cc returned.  No further fluid instilled.  KUB shows PD cath tip in pelvis and normal stool burden, also evidence of R abd body wall edema.    ROS  no fever  no sob or cough  no LE edema  no constipation, takes lactulose, last BM yesterday    Past Medical History  Past Medical History  Diagnosis Date  . Bell's palsy 05/12/2008  . DIABETES MELLITUS, TYPE II 03/16/2008  . HYPERTENSION 01/06/2010  . OBESITY 03/16/2008  . Proliferative diabetic retinopathy(362.02) 04/23/2009  . STASIS DERMATITIS 07/06/2009  . Fibroid, uterine   . ESRD (end stage renal disease)     ESRD on HD  . Anemia   . CAD (coronary artery disease) 01/2012    a. Cardiac cath done for abn nuc 02/23/2012 - 2 vessel CAD s/p DES to mid-LAD, residual disease in the PL OM for medical therapy.     Past Surgical History  Past Surgical History  Procedure Laterality Date  . Appendectomy  2012  . Uterine fibroid surgery    . Abdominal hysterectomy    . Abcess drainage  2002    Right axilla  . Insertion of dialysis catheter  January 2014    Right IJ   . Capd insertion Left 04/08/2012    Procedure: LAPAROSCOPIC INSERTION CONTINUOUS AMBULATORY PERITONEAL DIALYSIS  (CAPD)  CATHETER;  Surgeon: Axel Filler, MD;  Location: [facility] OR;  Service: General;  Laterality: Left;  . Laparoscopic repositioning capd catheter N/A 07/08/2012    Procedure:  DIAGNOSTIC LAPAROSCOPY LAPAROSCOPIC REPOSITIONING CAPD CATHETER;  Surgeon: Axel Filler, MD;  Location: [facility] OR;  Service: General;  Laterality: N/A;    Family History  Family History  Problem Relation Age of Onset  . Heart disease Mother     MI at age 65  . Diabetes Father   . Kidney failure Father   . Diabetes Sister     1 sister with diabtes  . Diabetes Brother    Social History  reports that she quit smoking about 8 years ago. Her smoking use included Cigarettes. She smoked 0.00 packs per day. She has never used smokeless tobacco. She reports that  drinks alcohol. She reports that she does not use illicit drugs.  Allergies No Known Allergies  Home medications Prior to Admission medications   Medication Sig Start Date End Date Taking? Authorizing Provider  aspirin EC 81 MG EC tablet Take 1 tablet (81 mg total) by mouth daily. 02/24/12  Yes Brooke O Edmisten, PA-C  bisacodyl (DULCOLAX) 5 MG EC tablet Take 5 mg by mouth daily as needed for constipation.  Yes Historical Provider, MD  calcitRIOL (ROCALTROL) 0.5 MCG capsule Take 2 capsules (1 mcg total) by mouth daily. 07/09/12  Yes Sheffield Slider, PA-C  clopidogrel (PLAVIX) 75 MG tablet Take 1 tablet (75 mg total) by mouth daily with breakfast. 02/24/12  Yes Brooke O Edmisten, PA-C  epoetin alfa (EPOGEN) 10000 UNIT/ML injection Inject 1 mL (10,000 Units total) into the skin 3 (three) times a week. 07/09/12  Yes Sheffield Slider, PA-C  folic acid-vitamin b complex-vitamin c-selenium-zinc (DIALYVITE) 3 MG TABS Take 1 tablet by mouth daily.   Yes Historical Provider, MD  metoprolol succinate (TOPROL-XL) 50 MG 24 hr tablet Take 50 mg by mouth daily. 04/10/12  Yes Lewayne Bunting, MD  polyethylene glycol powder (GLYCOLAX/MIRALAX) powder Take 17 g by mouth daily.   Yes  Historical Provider, MD  potassium chloride SA (K-DUR,KLOR-CON) 20 MEQ tablet Take 20 mEq by mouth daily.   Yes Historical Provider, MD  pravastatin (PRAVACHOL) 40 MG tablet Take 40 mg by mouth every evening.   Yes Historical Provider, MD  sevelamer (RENVELA) 800 MG tablet Take 1,600 mg by mouth 3 (three) times daily with meals. Takes 1600mg  with each meal, and 800mg  with each snack.   Yes Historical Provider, MD    LABS Liver Function Tests  Recent Labs Lab 07/06/12 0834 07/07/12 0700 07/08/12 2100  ALBUMIN 2.5* 2.9* 2.6*   No results found for this basename: LIPASE, AMYLASE,  in the last 168 hours CBC  Recent Labs Lab 07/07/12 0700 07/08/12 2100 07/12/12 1109  WBC 4.9 5.9 6.5  NEUTROABS  --   --  3.3  HGB 9.2* 8.3* 8.9*  HCT 26.2* 23.3* 24.7*  MCV 89.1 88.3 89.2  PLT 173 137* 185   Basic Metabolic Panel  Recent Labs Lab 07/06/12 0834 07/07/12 0700 07/08/12 2100 07/12/12 1109  NA 140 140 139 138  K 3.0* 3.9 3.8 3.9  CL 108 107 108 101  CO2 21 26 20 26   GLUCOSE 91 118* 81 89  BUN 61* 39* 46* 38*  CREATININE 7.44* 4.96* 5.81* 7.08*  CALCIUM 8.4 9.1 8.9 8.9  PHOS 5.2* 3.7 4.3  --     Physical Exam:  Blood pressure 166/83, pulse 67, temperature 97.9 F (36.6 C), temperature source Oral, resp. rate 16, SpO2 98.00%. Gen: alert no distress HEENT:  EOMI, sclera anicteric, throat clear Neck: no JVD, no LAN Chest: clear bilat CV: regular, no rub or gallop, pedal pulses intact Abdomen: soft, obese, nontender, LLQ cath in place Ext: no LE edema, no joint effusion or deformity, no gangrene or ulceration Neuro: alert, Ox3, no focal deficit Access: LLQ PD cath intact   Impression/Plan  1. Nonfunctioning PD cath- confirmed malfunction (entry ok, very poor drainage) in ED this afternoon.  Just had repositioning earlier this week. Will have to admit, give enemas, consult surgery to evaluate, Watch closely, place HD cath if needed for signs of uremia or vol excess.    2. HTN/volume- mild vol excess 3. DM2 4. HTN 5. CAD s/p DES    [name]  MD [facility] [phone] pgr    [phone] cell 07/12/2012, 12:31 PM

## 2012-07-12 NOTE — ED Notes (Signed)
Revonda Standard, RN from [phone] at bedside

## 2012-07-13 DIAGNOSIS — N186 End stage renal disease: Secondary | ICD-10-CM

## 2012-07-13 LAB — COMPREHENSIVE METABOLIC PANEL
ALT: 18 U/L (ref 0–35)
AST: 40 U/L — ABNORMAL HIGH (ref 0–37)
Albumin: 2.2 g/dL — ABNORMAL LOW (ref 3.5–5.2)
Alkaline Phosphatase: 96 U/L (ref 39–117)
BUN: 43 mg/dL — ABNORMAL HIGH (ref 6–23)
CO2: 26 mEq/L (ref 19–32)
Calcium: 8.1 mg/dL — ABNORMAL LOW (ref 8.4–10.5)
Chloride: 103 mEq/L (ref 96–112)
Creatinine, Ser: 7.51 mg/dL — ABNORMAL HIGH (ref 0.50–1.10)
GFR calc Af Amer: 7 mL/min — ABNORMAL LOW (ref >90)
GFR calc non Af Amer: 6 mL/min — ABNORMAL LOW (ref >90)
Glucose, Bld: 100 mg/dL — ABNORMAL HIGH (ref 70–99)
Potassium: 3.6 mEq/L (ref 3.5–5.1)
Sodium: 139 mEq/L (ref 135–145)
Total Bilirubin: 0.1 mg/dL — ABNORMAL LOW (ref 0.3–1.2)
Total Protein: 4.9 g/dL — ABNORMAL LOW (ref 6.0–8.3)

## 2012-07-13 LAB — CBC
HCT: 22 % — ABNORMAL LOW (ref 36.0–46.0)
Hemoglobin: 7.6 g/dL — ABNORMAL LOW (ref 12.0–15.0)
MCH: 31.5 pg (ref 26.0–34.0)
MCHC: 34.5 g/dL (ref 30.0–36.0)
MCV: 91.3 fL (ref 78.0–100.0)
Platelets: 180 10*3/uL (ref 150–400)
RBC: 2.41 MIL/uL — ABNORMAL LOW (ref 3.87–5.11)
RDW: 14.8 % (ref 11.5–15.5)
WBC: 5.4 10*3/uL (ref 4.0–10.5)

## 2012-07-13 LAB — GLUCOSE, CAPILLARY
Glucose-Capillary: 102 mg/dL — ABNORMAL HIGH (ref 70–99)
Glucose-Capillary: 116 mg/dL — ABNORMAL HIGH (ref 70–99)
Glucose-Capillary: 115 mg/dL — ABNORMAL HIGH (ref 70–99)
Glucose-Capillary: 112 mg/dL — ABNORMAL HIGH (ref 70–99)

## 2012-07-13 MED ORDER — DOCUSATE SODIUM 283 MG RE ENEM
1.0000 | ENEMA | Freq: Once | RECTAL | Status: AC
  Administered 2012-07-13: 283 mg via RECTAL
  Filled 2012-07-13: qty 1

## 2012-07-13 MED ORDER — ALTEPLASE 100 MG IV SOLR
6.0000 mg | Freq: Once | INTRAVENOUS | Status: DC
  Filled 2012-07-13: qty 6

## 2012-07-13 MED ORDER — ENOXAPARIN SODIUM 30 MG/0.3ML ~~LOC~~ SOLN
30.0000 mg | SUBCUTANEOUS | Status: DC
  Administered 2012-07-13 – 2012-07-15 (×2): 30 mg via SUBCUTANEOUS
  Filled 2012-07-13 (×3): qty 0.3

## 2012-07-13 NOTE — Progress Notes (Addendum)
[facility]  Daily Progress Note  Assessment/Planning: Malfunctioning PD catheter, ESRD   See prior consult by Dr. Hart Rochester on 07/05/12  Dr. Arlean Hopping requests placement of access this admission as backup for pd cath given recurrent difficulties with the PD cath  Will tenatively scheduled for Tuesday for L BC AVF  Pt agrees to proceed.  Subjective   No complaints  Objective Filed Vitals:   07/12/12 1812 07/12/12 2123 07/13/12 0627 07/13/12 0947  BP: 178/88 154/74 120/82 166/78  Pulse: 78 88 72 73  Temp: 98.1 F (36.7 C) 98.7 F (37.1 C) 99.1 F (37.3 C) 98.1 F (36.7 C)  TempSrc:  Oral Oral Oral  Resp: 18 18 18 18   Height: 5\' 2"  (1.575 m) 5\' 2"  (1.575 m)    Weight: 181 lb (82.1 kg)     SpO2:  100% 100% 100%    Intake/Output Summary (Last 24 hours) at 07/13/12 1042 Last data filed at 07/12/12 2341  Gross per 24 hour  Intake    240 ml  Output      0 ml  Net    240 ml    PULM  CTAB CV  RRR GI  soft, NTND, PD in place and bandaged VASC  L arm without IV, palpable brachial pulse  Laboratory CBC    Component Value Date/Time   WBC 5.4 07/13/2012 0445   HGB 7.6* 07/13/2012 0445   HCT 22.0* 07/13/2012 0445   PLT 180 07/13/2012 0445    BMET    Component Value Date/Time   NA 139 07/13/2012 0445   K 3.6 07/13/2012 0445   CL 103 07/13/2012 0445   CO2 26 07/13/2012 0445   GLUCOSE 100* 07/13/2012 0445   BUN 43* 07/13/2012 0445   CREATININE 7.51* 07/13/2012 0445   CALCIUM 8.1* 07/13/2012 0445   GFRNONAA 6* 07/13/2012 0445   GFRAA 7* 07/13/2012 0445    Leonides Sake, MD [facility] Office: [phone] Pager: [phone]  07/13/2012, 10:42 AM

## 2012-07-13 NOTE — Progress Notes (Signed)
Administered enemeez enema. Patient stated she had a large BM. At the current time she has refused any other enemas. Patient stated we could re-assess need later in the day.

## 2012-07-13 NOTE — ED Provider Notes (Signed)
Medical screening examination/treatment/procedure(s) were conducted as a shared visit with non-physician practitioner(s) and myself.  I personally evaluated the patient during the encounter.  Pt with recent new peritoneal dialysis catheter. Is and Os are not matching x 2 days - and she has some right sided fullness. Nephro consulted -likely will need surgery involvement, but we will start with nephro team to see if they can trouble shoot.   Derwood Kaplan, MD 07/13/12 [phone]

## 2012-07-13 NOTE — Progress Notes (Signed)
Subjective: Feels good, no complaints  Objective Filed Vitals:   07/12/12 1600 07/12/12 1812 07/12/12 2123 07/13/12 0627  BP: 151/88 178/88 154/74 120/82  Pulse: 73 78 88 72  Temp:  98.1 F (36.7 C) 98.7 F (37.1 C) 99.1 F (37.3 C)  TempSrc:   Oral Oral  Resp: 16 18 18 18   Height:  5\' 2"  (1.575 m) 5\' 2"  (1.575 m)   Weight:  82.1 kg (181 lb)    SpO2: 97%  100% 100%    CBC:  Recent Labs Lab 07/08/12 2100 07/12/12 1109 07/13/12 0445  WBC 5.9 6.5 5.4  NEUTROABS  --  3.3  --   HGB 8.3* 8.9* 7.6*  HCT 23.3* 24.7* 22.0*  MCV 88.3 89.2 91.3  PLT 137* 185 180   Basic Metabolic Panel:  Recent Labs Lab 07/07/12 0700 07/08/12 2100 07/12/12 1109 07/13/12 0445  NA 140 139 138 139  K 3.9 3.8 3.9 3.6  CL 107 108 101 103  CO2 26 20 26 26   GLUCOSE 118* 81 89 100*  BUN 39* 46* 38* 43*  CREATININE 4.96* 5.81* 7.08* 7.51*  CALCIUM 9.1 8.9 8.9 8.1*  PHOS 3.7 4.3  --   --     Physical Exam:  Blood pressure 120/82, pulse 72, temperature 99.1 F (37.3 C), temperature source Oral, resp. rate 18, height 5\' 2"  (1.575 m), weight 82.1 kg (181 lb), SpO2 100.00%.  Gen: alert no distress  HEENT: EOMI, sclera anicteric, throat clear  Neck: no JVD, no LAN  Chest: clear bilat  CV: regular, no rub or gallop, pedal pulses intact  Abdomen: soft, obese, nontender, LLQ cath in place  Ext: no LE edema, no joint effusion or deformity, no gangrene or ulceration  Neuro: alert, Ox3, no focal deficit  Access: LLQ PD cath intact   Impression/Plan  1. Malfunctioning PD cath- instills but won't drain. Repositioned into pelvic on 6/15, KUB shows still in pelvis. Give enemas today. Gen surg aware and saw pt yest, per pt plan is for OR tomorrow per Dr Derrell Lolling. Appreciate surg assistance 2. HTN/volume- mild vol excess, pt instructed to limit po intake so that we can hopefully refrain from having to do HD again 3. DM2- on SSI, no diabetic meds at home, BS' less than 100 4. CAD s/p DES    [name]   MD [phone] pgr    [phone] cell 07/13/2012, 8:46 AM

## 2012-07-14 ENCOUNTER — Encounter (HOSPITAL_COMMUNITY): Payer: Self-pay | Admitting: Anesthesiology

## 2012-07-14 ENCOUNTER — Encounter (HOSPITAL_COMMUNITY)
Admission: EM | Disposition: A | Discharge status: Home / Self Care | Payer: Self-pay | Source: Home / Self Care | Attending: Nephrology

## 2012-07-14 ENCOUNTER — Inpatient Hospital Stay (HOSPITAL_COMMUNITY): Payer: Medicare Other | Admitting: Anesthesiology

## 2012-07-14 DIAGNOSIS — T85691A Other mechanical complication of intraperitoneal dialysis catheter, initial encounter: Secondary | ICD-10-CM

## 2012-07-14 DIAGNOSIS — N186 End stage renal disease: Secondary | ICD-10-CM

## 2012-07-14 HISTORY — PX: LAPAROSCOPY: SHX197

## 2012-07-14 LAB — BASIC METABOLIC PANEL
BUN: 45 mg/dL — ABNORMAL HIGH (ref 6–23)
CO2: 23 mEq/L (ref 19–32)
Calcium: 8.5 mg/dL (ref 8.4–10.5)
Chloride: 105 mEq/L (ref 96–112)
Creatinine, Ser: 7.4 mg/dL — ABNORMAL HIGH (ref 0.50–1.10)
GFR calc Af Amer: 7 mL/min — ABNORMAL LOW (ref >90)
GFR calc non Af Amer: 6 mL/min — ABNORMAL LOW (ref >90)
Glucose, Bld: 93 mg/dL (ref 70–99)
Potassium: 3.5 mEq/L (ref 3.5–5.1)
Sodium: 137 mEq/L (ref 135–145)

## 2012-07-14 LAB — CBC
HCT: 22.5 % — ABNORMAL LOW (ref 36.0–46.0)
Hemoglobin: 7.7 g/dL — ABNORMAL LOW (ref 12.0–15.0)
MCH: 31.3 pg (ref 26.0–34.0)
MCHC: 34.2 g/dL (ref 30.0–36.0)
MCV: 91.5 fL (ref 78.0–100.0)
Platelets: 192 10*3/uL (ref 150–400)
RBC: 2.46 MIL/uL — ABNORMAL LOW (ref 3.87–5.11)
RDW: 15.4 % (ref 11.5–15.5)
WBC: 5.6 10*3/uL (ref 4.0–10.5)

## 2012-07-14 LAB — GLUCOSE, CAPILLARY
Glucose-Capillary: 84 mg/dL (ref 70–99)
Glucose-Capillary: 103 mg/dL — ABNORMAL HIGH (ref 70–99)
Glucose-Capillary: 86 mg/dL (ref 70–99)
Glucose-Capillary: 181 mg/dL — ABNORMAL HIGH (ref 70–99)
Glucose-Capillary: 127 mg/dL — ABNORMAL HIGH (ref 70–99)

## 2012-07-14 SURGERY — LAPAROSCOPY, DIAGNOSTIC
Anesthesia: General | Wound class: Clean

## 2012-07-14 MED ORDER — PROMETHAZINE HCL 25 MG/ML IJ SOLN: 6.2500 mg | INTRAMUSCULAR | Status: DC | PRN

## 2012-07-14 MED ORDER — MENTHOL 3 MG MT LOZG
1.0000 | LOZENGE | OROMUCOSAL | Status: DC | PRN
  Administered 2012-07-14: 3 mg via ORAL
  Filled 2012-07-14: qty 9

## 2012-07-14 MED ORDER — SIMVASTATIN 20 MG PO TABS
20.0000 mg | ORAL_TABLET | Freq: Every day | ORAL | Status: DC
  Administered 2012-07-14 – 2012-07-15 (×2): 20 mg via ORAL
  Filled 2012-07-14 (×3): qty 1

## 2012-07-14 MED ORDER — DARBEPOETIN ALFA-POLYSORBATE 100 MCG/0.5ML IJ SOLN
100.0000 ug | INTRAMUSCULAR | Status: DC
  Administered 2012-07-14: 100 ug via SUBCUTANEOUS
  Filled 2012-07-14: qty 0.5

## 2012-07-14 MED ORDER — SODIUM CHLORIDE 0.9 % IV SOLN
INTRAVENOUS | Status: DC | PRN
  Administered 2012-07-14: 07:00:00 via INTRAVENOUS

## 2012-07-14 MED ORDER — OXYCODONE-ACETAMINOPHEN 5-325 MG PO TABS
1.0000 | ORAL_TABLET | ORAL | Status: DC | PRN
  Administered 2012-07-14: 1 via ORAL
  Filled 2012-07-14: qty 1

## 2012-07-14 MED ORDER — PROPOFOL 10 MG/ML IV BOLUS
INTRAVENOUS | Status: DC | PRN
  Administered 2012-07-14: 100 mg via INTRAVENOUS

## 2012-07-14 MED ORDER — DEXTROSE 5 % IV SOLN
INTRAVENOUS | Status: AC
  Filled 2012-07-14: qty 50

## 2012-07-14 MED ORDER — FENTANYL CITRATE 0.05 MG/ML IJ SOLN: 25.0000 ug | INTRAMUSCULAR | Status: DC | PRN

## 2012-07-14 MED ORDER — GLYCOPYRROLATE 0.2 MG/ML IJ SOLN
INTRAMUSCULAR | Status: DC | PRN
  Administered 2012-07-14: .6 mg via INTRAVENOUS

## 2012-07-14 MED ORDER — ONDANSETRON HCL 4 MG/2ML IJ SOLN
INTRAMUSCULAR | Status: DC | PRN
  Administered 2012-07-14: 4 mg via INTRAVENOUS

## 2012-07-14 MED ORDER — MIDAZOLAM HCL 5 MG/5ML IJ SOLN
INTRAMUSCULAR | Status: DC | PRN
  Administered 2012-07-14: 2 mg via INTRAVENOUS

## 2012-07-14 MED ORDER — OXYCODONE HCL 5 MG/5ML PO SOLN: 5.0000 mg | Freq: Once | ORAL | Status: AC | PRN

## 2012-07-14 MED ORDER — FENTANYL CITRATE 0.05 MG/ML IJ SOLN
INTRAMUSCULAR | Status: DC | PRN
  Administered 2012-07-14 (×3): 50 ug via INTRAVENOUS

## 2012-07-14 MED ORDER — OXYCODONE HCL 5 MG PO TABS: 5.0000 mg | ORAL_TABLET | Freq: Once | ORAL | Status: AC | PRN

## 2012-07-14 MED ORDER — SODIUM CHLORIDE 0.9 % IR SOLN
Status: DC | PRN
  Administered 2012-07-14: 1000 mL

## 2012-07-14 MED ORDER — BUPIVACAINE HCL 0.25 % IJ SOLN
INTRAMUSCULAR | Status: DC | PRN
  Administered 2012-07-14: 10 mL

## 2012-07-14 MED ORDER — ROCURONIUM BROMIDE 100 MG/10ML IV SOLN
INTRAVENOUS | Status: DC | PRN
  Administered 2012-07-14: 35 mg via INTRAVENOUS
  Administered 2012-07-14: 15 mg via INTRAVENOUS

## 2012-07-14 MED ORDER — LIDOCAINE HCL (CARDIAC) 20 MG/ML IV SOLN
INTRAVENOUS | Status: DC | PRN
  Administered 2012-07-14: 100 mg via INTRAVENOUS

## 2012-07-14 MED ORDER — BUPIVACAINE-EPINEPHRINE PF 0.25-1:200000 % IJ SOLN
INTRAMUSCULAR | Status: AC
  Filled 2012-07-14: qty 30

## 2012-07-14 MED ORDER — NEOSTIGMINE METHYLSULFATE 1 MG/ML IJ SOLN
INTRAMUSCULAR | Status: DC | PRN
  Administered 2012-07-14: 3 mg via INTRAVENOUS

## 2012-07-14 MED ORDER — CEFUROXIME SODIUM 1.5 G IJ SOLR
INTRAMUSCULAR | Status: AC
  Administered 2012-07-14: 1.5 g via INTRAMUSCULAR
  Filled 2012-07-14: qty 1.5

## 2012-07-14 SURGICAL SUPPLY — 37 items
BLADE SURG ROTATE 9660 (MISCELLANEOUS) IMPLANT
CANISTER SUCTION 2500CC (MISCELLANEOUS) ×2 IMPLANT
CHLORAPREP W/TINT 26ML (MISCELLANEOUS) ×4 IMPLANT
CLOSURE STERI-STRIP 1/4X4 (GAUZE/BANDAGES/DRESSINGS) ×2 IMPLANT
CLOTH BEACON ORANGE TIMEOUT ST (SAFETY) ×2 IMPLANT
COVER SURGICAL LIGHT HANDLE (MISCELLANEOUS) ×2 IMPLANT
DECANTER SPIKE VIAL GLASS SM (MISCELLANEOUS) IMPLANT
DERMABOND ADVANCED (GAUZE/BANDAGES/DRESSINGS) ×1
DERMABOND ADVANCED .7 DNX12 (GAUZE/BANDAGES/DRESSINGS) ×1 IMPLANT
DRAPE UTILITY 15X26 W/TAPE STR (DRAPE) ×4 IMPLANT
DRAPE WARM FLUID 44X44 (DRAPE) IMPLANT
ELECT REM PT RETURN 9FT ADLT (ELECTROSURGICAL) ×2
ELECTRODE REM PT RTRN 9FT ADLT (ELECTROSURGICAL) ×1 IMPLANT
GAUZE SPONGE 2X2 8PLY STRL LF (GAUZE/BANDAGES/DRESSINGS) ×1 IMPLANT
GLOVE BIO SURGEON STRL SZ7.5 (GLOVE) ×2 IMPLANT
GOWN STRL NON-REIN LRG LVL3 (GOWN DISPOSABLE) ×4 IMPLANT
GOWN STRL REIN XL XLG (GOWN DISPOSABLE) ×2 IMPLANT
IV NS 1000ML (IV SOLUTION) ×2
IV NS 1000ML BAXH (IV SOLUTION) ×2 IMPLANT
KIT BASIN OR (CUSTOM PROCEDURE TRAY) ×2 IMPLANT
KIT ROOM TURNOVER OR (KITS) ×2 IMPLANT
NS IRRIG 1000ML POUR BTL (IV SOLUTION) ×2 IMPLANT
PAD ARMBOARD 7.5X6 YLW CONV (MISCELLANEOUS) ×4 IMPLANT
SCISSORS LAP 5X35 DISP (ENDOMECHANICALS) ×2 IMPLANT
SET IRRIG TUBING LAPAROSCOPIC (IRRIGATION / IRRIGATOR) IMPLANT
SLEEVE ENDOPATH XCEL 5M (ENDOMECHANICALS) ×4 IMPLANT
SPONGE GAUZE 2X2 STER 10/PKG (GAUZE/BANDAGES/DRESSINGS) ×1
SUT MNCRL AB 4-0 PS2 18 (SUTURE) ×2 IMPLANT
TAPE CLOTH SURG 4X10 WHT LF (GAUZE/BANDAGES/DRESSINGS) ×2 IMPLANT
TOWEL OR 17X24 6PK STRL BLUE (TOWEL DISPOSABLE) ×2 IMPLANT
TOWEL OR 17X26 10 PK STRL BLUE (TOWEL DISPOSABLE) ×2 IMPLANT
TRAY LAPAROSCOPIC (CUSTOM PROCEDURE TRAY) ×2 IMPLANT
TROCAR XCEL BLUNT TIP 100MML (ENDOMECHANICALS) IMPLANT
TROCAR XCEL NON-BLD 11X100MML (ENDOMECHANICALS) ×2 IMPLANT
TROCAR XCEL NON-BLD 5MMX100MML (ENDOMECHANICALS) ×2 IMPLANT
TUBING CYSTO DISP (UROLOGICAL SUPPLIES) ×2 IMPLANT
WATER STERILE IRR 1000ML POUR (IV SOLUTION) IMPLANT

## 2012-07-14 NOTE — Progress Notes (Signed)
CAPD exchange was completed. Patient tolerated procedure well. No complications or complaints throughout the process.

## 2012-07-14 NOTE — Anesthesia Preprocedure Evaluation (Addendum)
Anesthesia Evaluation  Patient identified by MRN, date of birth, ID band Patient awake    Reviewed: Allergy & Precautions, H&P , NPO status , Patient's Chart, lab work & pertinent test results, reviewed documented beta blocker date and time   Airway Mallampati: II TM Distance: >3 FB Neck ROM: full    Dental  (+) Dental Advidsory Given, Edentulous Lower, Edentulous Upper, Upper Dentures and Lower Dentures   Pulmonary          Cardiovascular hypertension, On Home Beta Blockers + CAD     Neuro/Psych    GI/Hepatic   Endo/Other  diabetes, Type 1, Insulin Dependent  Renal/GU ESRF and DialysisRenal disease     Musculoskeletal   Abdominal   Peds  Hematology  (+) Blood dyscrasia, ,   Anesthesia Other Findings   Reproductive/Obstetrics                         Anesthesia Physical Anesthesia Plan  ASA: III  Anesthesia Plan: General   Post-op Pain Management:    Induction: Intravenous  Airway Management Planned: Oral ETT  Additional Equipment:   Intra-op Plan:   Post-operative Plan: Extubation in OR  Informed Consent: I have reviewed the patients History and Physical, chart, labs and discussed the procedure including the risks, benefits and alternatives for the proposed anesthesia with the patient or authorized representative who has indicated his/her understanding and acceptance.   Dental Advisory Given and Dental advisory given  Plan Discussed with: Anesthesiologist, CRNA and Surgeon  Anesthesia Plan Comments:        Anesthesia Quick Evaluation

## 2012-07-14 NOTE — Preoperative (Signed)
Beta Blockers   Reason not to administer Beta Blockers:Not Applicable 

## 2012-07-14 NOTE — Anesthesia Procedure Notes (Signed)
Procedure Name: Intubation Date/Time: 07/14/2012 9:14 AM Performed by: Carmela Rima Pre-anesthesia Checklist: Patient identified, Timeout performed, Emergency Drugs available, Suction available and Patient being monitored Patient Re-evaluated:Patient Re-evaluated prior to inductionOxygen Delivery Method: Circle system utilized Preoxygenation: Pre-oxygenation with 100% oxygen Intubation Type: IV induction Ventilation: Mask ventilation without difficulty Laryngoscope Size: Mac and 3 Grade View: Grade I Tube type: Oral Tube size: 7.5 mm Number of attempts: 1 Placement Confirmation: ETT inserted through vocal cords under direct vision,  positive ETCO2 and breath sounds checked- equal and bilateral Secured at: 21 cm Tube secured with: Tape Dental Injury: Teeth and Oropharynx as per pre-operative assessment

## 2012-07-14 NOTE — Transfer of Care (Signed)
Immediate Anesthesia Transfer of Care Note  Patient: Lori Greer  Procedure(s) Performed: Procedure(s): LAPAROSCOPY DIAGNOSTIC WITH LYSIS OF ADHESIONS AND REPOSITIONING OF PERITONEAL DIALYSIS CATHETER (N/A)  Patient Location: PACU  Anesthesia Type:General  Level of Consciousness: awake, alert  and oriented  Airway & Oxygen Therapy: Patient Spontanous Breathing and Patient connected to nasal cannula oxygen  Post-op Assessment: Report given to PACU RN, Post -op Vital signs reviewed and stable and Patient moving all extremities X 4  Post vital signs: Reviewed and stable  Complications: No apparent anesthesia complications

## 2012-07-14 NOTE — Anesthesia Postprocedure Evaluation (Signed)
  Anesthesia Post-op Note  Patient: Lori Greer  Procedure(s) Performed: Procedure(s): LAPAROSCOPY DIAGNOSTIC WITH LYSIS OF ADHESIONS AND REPOSITIONING OF PERITONEAL DIALYSIS CATHETER (N/A)  Patient Location: PACU  Anesthesia Type:General  Level of Consciousness: awake and alert   Airway and Oxygen Therapy: Patient Spontanous Breathing  Post-op Pain: mild  Post-op Assessment: Post-op Vital signs reviewed  Post-op Vital Signs: stable  Complications: No apparent anesthesia complications

## 2012-07-14 NOTE — Progress Notes (Signed)
[name] Progress Note  Subjective:   Last real dialysis was Thursday  Objective Filed Vitals:   07/13/12 1458 07/13/12 1745 07/13/12 2103 07/14/12 0535  BP: 146/69 146/85 155/72 131/52  Pulse: 79 88 76 83  Temp: 98.3 F (36.8 C) 98.2 F (36.8 C) 98.5 F (36.9 C) 99 F (37.2 C)  TempSrc: Oral Oral Oral Oral  Resp: 18 18 18 18   Height:   5\' 2"  (1.575 m)   Weight:   82.101 kg (181 lb)   SpO2: 100% 100% 100% 100%   Physical Exam in PACU General: drowsy Heart: RRR Lungs: grossly clear Abdomen: min BS - soft Extremities: SCDs in place Dialysis Access: PD catheter  Impression/Plan  1. ESRD with Malfunctioning PD cath( instills but won't drain) . Repositioned into pelvic on 6/15, KUB shows still in pelvis. Dx lap today showed catheter in pelvis.  Had lysis of adhesions of the anterior abd wall and intra bowel by Dr. Antonieta Pert. On daily KCl 20 meQ; resume CAPD today - ok'd by Dr. Antonieta Pert 2. HTN/volume- mild vol excess, pt instructed to limit po intake so that we can hopefully refrain from having to do HD again; metoprolol 50 daily; needs decrease volume 3. DM2- on SSI, no diabetic meds at home, 4. CAD s/p DES, on plavix, resume statin 5. Anemia -Hgb 7.7  Epo 10K SQ 3 x per week - start Aranesp 100 q week SQ today - had 100 6/14 Received 1 unit PRBC during surgery today. 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 Nutrition - resume CL for lunch and then low P diet at supper; Alb low at 2.2   Lori Slider, PA-C [facility] [phone] 07/14/2012,10:28 AM  LOS: 2 days   Patient seen and examined.  Agree with assessment and plan as above. Lori Moselle  MD [phone] pgr    [phone] cell 07/14/2012, 5:21 PM   Additional Objective Labs: Basic Metabolic Panel:  Recent Labs Lab 07/08/12 2100 07/12/12 1109 07/13/12 0445 07/14/12 0540  NA 139 138 139 137  K 3.8 3.9 3.6 3.5  CL 108 101 103 105   CO2 20 26 26 23   GLUCOSE 81 89 100* 93  BUN 46* 38* 43* 45*  CREATININE 5.81* 7.08* 7.51* 7.40*  CALCIUM 8.9 8.9 8.1* 8.5  PHOS 4.3  --   --   --    Liver Function Tests:  Recent Labs Lab 07/08/12 2100 07/13/12 0445  AST  --  40*  ALT  --  18  ALKPHOS  --  96  BILITOT  --  0.1*  PROT  --  4.9*  ALBUMIN 2.6* 2.2*  CBC:  Recent Labs Lab 07/08/12 2100 07/12/12 1109 07/13/12 0445 07/14/12 0540  WBC 5.9 6.5 5.4 5.6  NEUTROABS  --  3.3  --   --   HGB 8.3* 8.9* 7.6* 7.7*  HCT 23.3* 24.7* 22.0* 22.5*  MCV 88.3 89.2 91.3 91.5  PLT 137* 185 180 192   Blood Culture    Component Value Date/Time   SDES FLUID PERITONEAL 07/12/2012 1403   SPECREQUEST Normal 07/12/2012 1403   CULT NO GROWTH 1 DAY 07/12/2012 1403   REPTSTATUS PENDING 07/12/2012 1403   CBG:  Recent Labs Lab 07/13/12 0720 07/13/12 1136 07/13/12 1701 07/13/12 2106 07/14/12 0729  GLUCAP 102* 116* 115* 112* 84  Studies/Results: Dg Abd 1 View  07/12/2012   *RADIOLOGY REPORT*  Clinical Data: Evaluate  Perez needle catheter location and stool burden  ABDOMEN - 1 VIEW  Comparison: Prior radiographs 07/06/2012  Findings: The peritoneal dialysis catheter has been repositioned from the right hemiabdomen into the anatomic pelvis.  The bowel gas pattern is nonobstructed.  Unremarkable colonic stool burden. Surgical clips noted in the right lower quadrant.  Nonspecific reticulation and thickening of the subcutaneous soft tissues on the right consistent with body wall edema.  IMPRESSION:  1.  Peritoneal dialysis catheter has read been repositioned from the right hemiabdomen to the right anatomic pelvis.  2.  Asymmetric body wall edema on the right of uncertain etiology.   Original Report Authenticated By: Malachy Moan, M.D.   Medications: . heparin     . aspirin EC  81 mg Oral Daily  . calcitRIOL  1 mcg Oral Daily  . cefUROXime      . clopidogrel  75 mg Oral Q breakfast  . dextrose      . enoxaparin (LOVENOX)  injection  30 mg Subcutaneous Custom  . insulin aspart  0-5 Units Subcutaneous QHS  . insulin aspart  0-9 Units Subcutaneous TID WC  . metoprolol succinate  50 mg Oral Daily  . multivitamin  1 tablet Oral Daily  . polyethylene glycol powder  17 g Oral Daily  . potassium chloride SA  20 mEq Oral Daily  . sevelamer carbonate  1,600 mg Oral TID WC

## 2012-07-14 NOTE — Op Note (Signed)
Pre Operative Diagnosis:  CAPD malfunction  Post Operative Diagnosis: same  Procedure: Reposition of CAP catheter and LOA  Surgeon: Dr. Axel Filler  Assistant: none  Anesthesia: GETA  EBL: <5cc  Complications: none  Counts: reported as correct x 2  Findings:  Pt had the catheter within her pelvis in good position.  There were some adhesion to the anterior abd wall, and intra bowel adhesion.  These were lysed.  1000cc of saline were instilled easily and returned with no issues.  The catheter lay in the pelvis  Indications for procedure:  Pt is a 42 y/o F with ESRD on CAPD with a malfunctioning CAPD. After cousneling she wished to continue CAPD, and was set up for dx lap and repositioing.    Details of the procedure:The patient was taken back to the operating room. The patient was placed in supine position with bilateral SCDs in place. After appropriate anitbiotics were confirmed, a time-out was confirmed and all facts were verified.  A veress needle technique was used to insuflate the abd to .  This was followed by a 5mm trocar and camera.  There was no injury to any intra abd organ.  A 2nd and 3rd trochar were then placed in the right lower quadrant and epigastric region. The catheter was identified to be in the pelvis. This was placed into view the camera. I initially flushed the catheter with 40 cc of saline which flushed easily. There were some small bowel to anterior abdominal wall adhesions are taken down. There also some pelvic adhesions that were lysed. At this time 1 L of sterile saline and tubing were used to instill saline into the abdomen. This easily draining to the abdomen. This was evacuated by gravity back into the bag. This drained easily 1000 cc of fluid exited the abdomen.   At this time the insufflation was evacuated the trochars removed the trocar sites reapproximated Monocryl subcuticular fashion.The patient was taken to the recovery room in stable  condition.

## 2012-07-15 LAB — GLUCOSE, CAPILLARY
Glucose-Capillary: 149 mg/dL — ABNORMAL HIGH (ref 70–99)
Glucose-Capillary: 100 mg/dL — ABNORMAL HIGH (ref 70–99)
Glucose-Capillary: 117 mg/dL — ABNORMAL HIGH (ref 70–99)
Glucose-Capillary: 116 mg/dL — ABNORMAL HIGH (ref 70–99)

## 2012-07-15 LAB — PATHOLOGIST SMEAR REVIEW

## 2012-07-15 MED ORDER — SORBITOL 70 % SOLN
60.0000 mL | Freq: Once | Status: AC
  Administered 2012-07-15: 60 mL via ORAL
  Filled 2012-07-15: qty 30

## 2012-07-15 NOTE — Progress Notes (Signed)
UR COMPLETED  

## 2012-07-15 NOTE — Progress Notes (Signed)
Subjective:  Feeling better Objective Vital signs in last 24 hours: Filed Vitals:   07/14/12 1719 07/14/12 1818 07/14/12 2105 07/15/12 0624  BP: 182/75 117/67 131/78 129/76  Pulse: 69 67 76 78  Temp: 99.5 F (37.5 C)  99.4 F (37.4 C) 99.5 F (37.5 C)  TempSrc: Oral  Oral Oral  Resp: 16  18 18   Height:   5\' 2"  (1.575 m)   Weight:   82.101 kg (181 lb)   SpO2: 100%  100% 100%   Weight change: 0 kg (0 lb)  Intake/Output Summary (Last 24 hours) at 07/15/12 0840 Last data filed at 07/15/12 1610  Gross per 24 hour  Intake    770 ml  Output     25 ml  Net    745 ml   Labs: Basic Metabolic Panel:  Recent Labs Lab 07/08/12 2100 07/12/12 1109 07/13/12 0445 07/14/12 0540  NA 139 138 139 137  K 3.8 3.9 3.6 3.5  CL 108 101 103 105  CO2 20 26 26 23   GLUCOSE 81 89 100* 93  BUN 46* 38* 43* 45*  CREATININE 5.81* 7.08* 7.51* 7.40*  CALCIUM 8.9 8.9 8.1* 8.5  PHOS 4.3  --   --   --    Liver Function Tests:  Recent Labs Lab 07/08/12 2100 07/13/12 0445  AST  --  40*  ALT  --  18  ALKPHOS  --  96  BILITOT  --  0.1*  PROT  --  4.9*  ALBUMIN 2.6* 2.2*   No results found for this basename: LIPASE, AMYLASE,  in the last 168 hours No results found for this basename: AMMONIA,  in the last 168 hours CBC:  Recent Labs Lab 07/08/12 2100 07/12/12 1109 07/13/12 0445 07/14/12 0540  WBC 5.9 6.5 5.4 5.6  NEUTROABS  --  3.3  --   --   HGB 8.3* 8.9* 7.6* 7.7*  HCT 23.3* 24.7* 22.0* 22.5*  MCV 88.3 89.2 91.3 91.5  PLT 137* 185 180 192   Cardiac Enzymes: No results found for this basename: CKTOTAL, CKMB, CKMBINDEX, TROPONINI,  in the last 168 hours CBG:  Recent Labs Lab 07/14/12 1040 07/14/12 1139 07/14/12 1722 07/14/12 2104 07/15/12 0725  GLUCAP 103* 86 181* 127* 149*    Iron Studies: No results found for this basename: IRON, TIBC, TRANSFERRIN, FERRITIN,  in the last 72 hours Studies/Results: No results found. Medications: . heparin     . aspirin EC  81 mg Oral  Daily  . calcitRIOL  1 mcg Oral Daily  . clopidogrel  75 mg Oral Q breakfast  . darbepoetin  100 mcg Subcutaneous Q7 days  . enoxaparin (LOVENOX) injection  30 mg Subcutaneous Custom  . insulin aspart  0-5 Units Subcutaneous QHS  . insulin aspart  0-9 Units Subcutaneous TID WC  . metoprolol succinate  50 mg Oral Daily  . multivitamin  1 tablet Oral Daily  . polyethylene glycol powder  17 g Oral Daily  . potassium chloride SA  20 mEq Oral Daily  . sevelamer carbonate  1,600 mg Oral TID WC  . simvastatin  20 mg Oral q1800   I  have reviewed scheduled and prn medications.  Physical Exam: General: Alert, nad, Appropriate Heart: RRR Lungs: CTA bilaterally Abdomen: BS pos. Pd fluid in , soft, non tender, exist site dressing no dc at pd cath Extremities: Dialysis Access:  No pedal edema, pd  cath   Problem/Plan:  1. ESRD with Malfunctioning PD cath( ON ADmit  instills but won't drain) . 07/13/12 Dr. Antonieta Pert Dx lap = Had lysis of adhesions of the anterior abd wall and intra bowel by Dr. Antonieta Pert K = 3.5 On daily KCl 20 meQ;No uremic symptoms. Wt stable/ resumed CAPD yesterday - ok'd by Dr. Antonieta Pert and so far draining okay per RN staff. PLANS for AVF placement in am for backup is needed. Dr. Ivar Bury follows for PD 2. HTN/volume-bp stable ,vol stable  No sob/ hopefully refrain from having to do HD ; metoprolol 50 daily. 3. DM2- on SSI, no diabetic meds at home, 4. CAD s/p DES, on plavix, resumed statin 5. 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 Nutrition -  low P diet ; Alb low at 2.2/ renavite   Lenny Pastel, PA-C [facility] [phone] 07/15/2012,8:40 AM  LOS: 3 days  I have seen and examined this patient and agree with plan per Lenny Pastel.  Catheter working well.  She feels well.  Plan for  AVF in AM then DC if catheter cont to work well.  Hg low even as outpt.  Recheck labs in AM Xoie Kreuser T,MD 07/15/2012 9:44 AM

## 2012-07-16 ENCOUNTER — Telehealth: Payer: Self-pay | Admitting: Vascular Surgery

## 2012-07-16 ENCOUNTER — Encounter (HOSPITAL_COMMUNITY)
Admission: EM | Disposition: A | Discharge status: Home / Self Care | Payer: Self-pay | Source: Home / Self Care | Attending: Nephrology

## 2012-07-16 ENCOUNTER — Inpatient Hospital Stay (HOSPITAL_COMMUNITY): Payer: Medicare Other | Admitting: Certified Registered"

## 2012-07-16 ENCOUNTER — Encounter (HOSPITAL_COMMUNITY): Payer: Self-pay | Admitting: Certified Registered"

## 2012-07-16 ENCOUNTER — Other Ambulatory Visit: Payer: Self-pay | Admitting: *Deleted

## 2012-07-16 ENCOUNTER — Encounter (HOSPITAL_COMMUNITY): Payer: Self-pay | Admitting: General Surgery

## 2012-07-16 ENCOUNTER — Other Ambulatory Visit: Payer: Self-pay

## 2012-07-16 DIAGNOSIS — T85611A Breakdown (mechanical) of intraperitoneal dialysis catheter, initial encounter: Secondary | ICD-10-CM

## 2012-07-16 DIAGNOSIS — N186 End stage renal disease: Secondary | ICD-10-CM

## 2012-07-16 DIAGNOSIS — D649 Anemia, unspecified: Secondary | ICD-10-CM

## 2012-07-16 DIAGNOSIS — N2581 Secondary hyperparathyroidism of renal origin: Secondary | ICD-10-CM

## 2012-07-16 DIAGNOSIS — Z48812 Encounter for surgical aftercare following surgery on the circulatory system: Secondary | ICD-10-CM

## 2012-07-16 DIAGNOSIS — Z4931 Encounter for adequacy testing for hemodialysis: Secondary | ICD-10-CM

## 2012-07-16 HISTORY — PX: AV FISTULA PLACEMENT: SHX1204

## 2012-07-16 LAB — BODY FLUID CULTURE
Culture: NO GROWTH
Special Requests: NORMAL

## 2012-07-16 LAB — RENAL FUNCTION PANEL
Albumin: 2.2 g/dL — ABNORMAL LOW (ref 3.5–5.2)
BUN: 40 mg/dL — ABNORMAL HIGH (ref 6–23)
CO2: 24 mEq/L (ref 19–32)
Calcium: 8.6 mg/dL (ref 8.4–10.5)
Chloride: 105 mEq/L (ref 96–112)
Creatinine, Ser: 6.41 mg/dL — ABNORMAL HIGH (ref 0.50–1.10)
GFR calc Af Amer: 8 mL/min — ABNORMAL LOW (ref >90)
GFR calc non Af Amer: 7 mL/min — ABNORMAL LOW (ref >90)
Glucose, Bld: 99 mg/dL (ref 70–99)
Phosphorus: 3.4 mg/dL (ref 2.3–4.6)
Potassium: 3.8 mEq/L (ref 3.5–5.1)
Sodium: 139 mEq/L (ref 135–145)

## 2012-07-16 LAB — GLUCOSE, CAPILLARY
Glucose-Capillary: 161 mg/dL — ABNORMAL HIGH (ref 70–99)
Glucose-Capillary: 79 mg/dL (ref 70–99)
Glucose-Capillary: 84 mg/dL (ref 70–99)
Glucose-Capillary: 69 mg/dL — ABNORMAL LOW (ref 70–99)
Glucose-Capillary: 156 mg/dL — ABNORMAL HIGH (ref 70–99)

## 2012-07-16 LAB — CBC
HCT: 26.1 % — ABNORMAL LOW (ref 36.0–46.0)
Hemoglobin: 8.8 g/dL — ABNORMAL LOW (ref 12.0–15.0)
MCH: 31.3 pg (ref 26.0–34.0)
MCHC: 33.7 g/dL (ref 30.0–36.0)
MCV: 92.9 fL (ref 78.0–100.0)
Platelets: 199 10*3/uL (ref 150–400)
RBC: 2.81 MIL/uL — ABNORMAL LOW (ref 3.87–5.11)
RDW: 15.9 % — ABNORMAL HIGH (ref 11.5–15.5)
WBC: 6 10*3/uL (ref 4.0–10.5)

## 2012-07-16 SURGERY — ARTERIOVENOUS (AV) FISTULA CREATION
Anesthesia: Monitor Anesthesia Care | Site: Arm Upper | Laterality: Left | Wound class: Clean

## 2012-07-16 MED ORDER — CEFUROXIME SODIUM 1.5 G IJ SOLR
INTRAMUSCULAR | Status: AC
  Administered 2012-07-16: 1.5 g via INTRAMUSCULAR
  Filled 2012-07-16: qty 1.5

## 2012-07-16 MED ORDER — SODIUM CHLORIDE 0.9 % IR SOLN
Status: DC | PRN
  Administered 2012-07-16: 10:00:00

## 2012-07-16 MED ORDER — SORBITOL 70 % SOLN: 60.0000 mL | Freq: Once | Status: DC

## 2012-07-16 MED ORDER — ONDANSETRON HCL 4 MG/2ML IJ SOLN
INTRAMUSCULAR | Status: DC | PRN
  Administered 2012-07-16: 4 mg via INTRAVENOUS

## 2012-07-16 MED ORDER — FENTANYL CITRATE 0.05 MG/ML IJ SOLN
INTRAMUSCULAR | Status: DC | PRN
  Administered 2012-07-16: 100 ug via INTRAVENOUS

## 2012-07-16 MED ORDER — PROTAMINE SULFATE 10 MG/ML IV SOLN
INTRAVENOUS | Status: DC | PRN
  Administered 2012-07-16: 20 mg via INTRAVENOUS
  Administered 2012-07-16: 10 mg via INTRAVENOUS

## 2012-07-16 MED ORDER — SODIUM CHLORIDE 0.9 % IV SOLN
INTRAVENOUS | Status: DC
  Administered 2012-07-16 (×2): via INTRAVENOUS

## 2012-07-16 MED ORDER — PROPOFOL INFUSION 10 MG/ML OPTIME
INTRAVENOUS | Status: DC | PRN
  Administered 2012-07-16: 100 ug/kg/min via INTRAVENOUS

## 2012-07-16 MED ORDER — LIDOCAINE HCL (PF) 1 % IJ SOLN
INTRAMUSCULAR | Status: DC | PRN
  Administered 2012-07-16: 30 mL

## 2012-07-16 MED ORDER — MORPHINE SULFATE 2 MG/ML IJ SOLN: 2.0000 mg | INTRAMUSCULAR | Status: DC | PRN

## 2012-07-16 MED ORDER — MIDAZOLAM HCL 5 MG/5ML IJ SOLN
INTRAMUSCULAR | Status: DC | PRN
  Administered 2012-07-16: 2 mg via INTRAVENOUS

## 2012-07-16 MED ORDER — DEXTROSE 5 % IV SOLN
INTRAVENOUS | Status: AC
  Filled 2012-07-16: qty 50

## 2012-07-16 MED ORDER — SORBITOL 70 % SOLN
60.0000 mL | Freq: Once | Status: DC
  Filled 2012-07-16: qty 60

## 2012-07-16 MED ORDER — HEPARIN SODIUM (PORCINE) 1000 UNIT/ML IJ SOLN
INTRAMUSCULAR | Status: DC | PRN
  Administered 2012-07-16: 6000 [IU] via INTRAVENOUS

## 2012-07-16 MED ORDER — LIDOCAINE HCL (PF) 1 % IJ SOLN
INTRAMUSCULAR | Status: AC
  Filled 2012-07-16: qty 30

## 2012-07-16 MED ORDER — PHENYLEPHRINE HCL 10 MG/ML IJ SOLN
INTRAMUSCULAR | Status: DC | PRN
  Administered 2012-07-16: 80 ug via INTRAVENOUS

## 2012-07-16 MED ORDER — 0.9 % SODIUM CHLORIDE (POUR BTL) OPTIME
TOPICAL | Status: DC | PRN
  Administered 2012-07-16: 1000 mL

## 2012-07-16 MED ORDER — THROMBIN 20000 UNITS EX SOLR
CUTANEOUS | Status: AC
  Filled 2012-07-16: qty 20000

## 2012-07-16 SURGICAL SUPPLY — 45 items
ARMBAND PINK RESTRICT EXTREMIT (MISCELLANEOUS) ×2 IMPLANT
CANISTER SUCTION 2500CC (MISCELLANEOUS) ×2 IMPLANT
CLIP TI MEDIUM 6 (CLIP) ×2 IMPLANT
CLIP TI WIDE RED SMALL 6 (CLIP) ×4 IMPLANT
CLOTH BEACON ORANGE TIMEOUT ST (SAFETY) ×2 IMPLANT
COVER PROBE W GEL 5X96 (DRAPES) IMPLANT
COVER SURGICAL LIGHT HANDLE (MISCELLANEOUS) ×2 IMPLANT
DECANTER SPIKE VIAL GLASS SM (MISCELLANEOUS) IMPLANT
DERMABOND ADHESIVE PROPEN (GAUZE/BANDAGES/DRESSINGS) ×1
DERMABOND ADVANCED (GAUZE/BANDAGES/DRESSINGS) ×1
DERMABOND ADVANCED .7 DNX12 (GAUZE/BANDAGES/DRESSINGS) ×1 IMPLANT
DERMABOND ADVANCED .7 DNX6 (GAUZE/BANDAGES/DRESSINGS) ×1 IMPLANT
DRAIN PENROSE 1/2X12 LTX STRL (WOUND CARE) IMPLANT
ELECT REM PT RETURN 9FT ADLT (ELECTROSURGICAL) ×2
ELECTRODE REM PT RTRN 9FT ADLT (ELECTROSURGICAL) ×1 IMPLANT
GLOVE BIO SURGEON STRL SZ7 (GLOVE) ×2 IMPLANT
GLOVE BIO SURGEON STRL SZ7.5 (GLOVE) ×2 IMPLANT
GLOVE BIOGEL PI IND STRL 6.5 (GLOVE) ×2 IMPLANT
GLOVE BIOGEL PI IND STRL 7.0 (GLOVE) ×1 IMPLANT
GLOVE BIOGEL PI IND STRL 7.5 (GLOVE) ×1 IMPLANT
GLOVE BIOGEL PI IND STRL 8 (GLOVE) ×2 IMPLANT
GLOVE BIOGEL PI INDICATOR 6.5 (GLOVE) ×2
GLOVE BIOGEL PI INDICATOR 7.0 (GLOVE) ×1
GLOVE BIOGEL PI INDICATOR 7.5 (GLOVE) ×1
GLOVE BIOGEL PI INDICATOR 8 (GLOVE) ×2
GLOVE ECLIPSE 6.5 STRL STRAW (GLOVE) ×2 IMPLANT
GLOVE SS BIOGEL STRL SZ 6.5 (GLOVE) ×1 IMPLANT
GLOVE SUPERSENSE BIOGEL SZ 6.5 (GLOVE) ×1
GLOVE SURG SS PI 7.5 STRL IVOR (GLOVE) ×2 IMPLANT
GOWN STRL NON-REIN LRG LVL3 (GOWN DISPOSABLE) ×8 IMPLANT
KIT BASIN OR (CUSTOM PROCEDURE TRAY) ×2 IMPLANT
KIT ROOM TURNOVER OR (KITS) ×2 IMPLANT
NS IRRIG 1000ML POUR BTL (IV SOLUTION) ×2 IMPLANT
PACK CV ACCESS (CUSTOM PROCEDURE TRAY) ×2 IMPLANT
PAD ARMBOARD 7.5X6 YLW CONV (MISCELLANEOUS) ×4 IMPLANT
SPONGE GAUZE 4X4 12PLY (GAUZE/BANDAGES/DRESSINGS) IMPLANT
SPONGE SURGIFOAM ABS GEL 100 (HEMOSTASIS) IMPLANT
SUT PROLENE 6 0 BV (SUTURE) ×4 IMPLANT
SUT VIC AB 3-0 SH 27 (SUTURE) ×1
SUT VIC AB 3-0 SH 27X BRD (SUTURE) ×1 IMPLANT
SUT VICRYL 4-0 PS2 18IN ABS (SUTURE) ×2 IMPLANT
TOWEL OR 17X24 6PK STRL BLUE (TOWEL DISPOSABLE) ×2 IMPLANT
TOWEL OR 17X26 10 PK STRL BLUE (TOWEL DISPOSABLE) ×2 IMPLANT
UNDERPAD 30X30 INCONTINENT (UNDERPADS AND DIAPERS) ×2 IMPLANT
WATER STERILE IRR 1000ML POUR (IV SOLUTION) ×2 IMPLANT

## 2012-07-16 NOTE — Preoperative (Signed)
Beta Blockers   Reason not to administer Beta Blockers:Not Applicable 

## 2012-07-16 NOTE — Discharge Summary (Signed)
Physician Discharge Summary   Physician Discharge Summary  Patient ID: Lori Greer MRN: [id number] DOB/AGE: January 26, 1970 42 y.o.  Admit date: 07/12/2012 Discharge date: 07/16/2012  Discharge Diagnoses: 1. Poorly functioned PD catheter secondary to adhesions - resolved 2. ESRD on CAPD 3. Anemia 4. Hypertension 5. Type 2 DM 6. Secondary hyperparathyroidism 7. CAD s/p PTCA with drug eluting stent LAD 02/23/12  Consults: Dr. Waverly Ferrari - [facility]                   Dr. Meade Maw - [facility] Procedures:   1.   07/14/12 Dx lap with lysis of abdominal and pelvic adhesions - Dr. Antonieta Pert 2.   07/16/12 - placement of left brachial cephalic AVF - Dr. Edilia Bo  Treatments:  Continuous Ambulatory Peritoneal Dialysis   HPI:  The patient was a 42 y.o. year-old with ESRD on PD x approx 1 year, doing CAPD. Was admitted last week with poor draining of cath, had temp cath for HD x 2 and on 6/15 went to OR for repositioning of PD cath done by Dr Grayling Congress. The cath was in LUQ attached to omentum and was returned to pelvic region and stitched to peritoneum. It drained and flushed well. Pt sent home, temp HD cath removed. Returning to ED reporting last 24hrs poor drainage, draining about 50% of fill. "It just stops". No cloudy fluid or abd pain, +abd swelling R upper abdomen  Hospital Course: 1. Poorly functional PD catheter - repositioned last week; repeat diagnostic laproscopic surgery this week showed adhesions in pelvis and abdomen which were lysed and she subsequently had no problems with CAPD during filling or draining throughout the remainder of her hospitalization. She will follow up with [facility] after discharge. 2. ESRD - CAPD as above; addditionally she had a left upper AVF placed for back up access during this hospitalization.  It was functioning well and she is to follow up with Dr. Edilia Bo in 3 weeks. 3. Anemia - Hgb noted to be trending down during her hospitalization last week; Epogen was  increased. However, it was down to 7.7 the date of surgery so she was transfused one unit PRBC during surgery with Hgb increase to 8.8 at discharge. 4. Metabolic bone disease - controlled with hectorol and binders - no change 5. Hypertension/volume - BP was elevated early into her hospitalization when she was not getting CAPD. Post surgery, higher concentrate dialysate was used and BP ranged between 112 - 140 systolic the day of discharge. Weights were consistently 82 kg. 6. Nutrition  - Albumin is low at 2.2.  I have notified the [facility] staff to ask the Dietitian at her dialysis center to follow up regarding this.  Diet: low phosphorus Discharge Medications:    Medication List    TAKE these medications       aspirin 81 MG EC tablet  Take 1 tablet (81 mg total) by mouth daily.     bisacodyl 5 MG EC tablet  Commonly known as:  DULCOLAX  Take 5 mg by mouth daily as needed for constipation.     calcitRIOL 0.5 MCG capsule  Commonly known as:  ROCALTROL  Take 2 capsules (1 mcg total) by mouth daily.     clopidogrel 75 MG tablet  Commonly known as:  PLAVIX  Take 1 tablet (75 mg total) by mouth daily with breakfast.     epoetin alfa 57846 UNIT/ML injection  Commonly known as:  EPOGEN  Inject 1 mL (10,000 Units total)  into the skin 3 (three) times a week.     folic acid-vitamin b complex-vitamin c-selenium-zinc 3 MG Tabs  Take 1 tablet by mouth daily.     metoprolol succinate 50 MG 24 hr tablet  Commonly known as:  TOPROL-XL  Take 50 mg by mouth daily.     polyethylene glycol powder powder  Commonly known as:  GLYCOLAX/MIRALAX  Take 17 g by mouth daily.     potassium chloride SA 20 MEQ tablet  Commonly known as:  K-DUR,KLOR-CON  Take 20 mEq by mouth daily.     pravastatin 40 MG tablet  Commonly known as:  PRAVACHOL  Take 40 mg by mouth every evening.     sevelamer carbonate 800 MG tablet  Commonly known as:  RENVELA  Take 1,600 mg by mouth 3 (three) times  daily with meals. Takes 1600mg  with each meal, and 800mg  with each snack.        Disposition: good     Discharge Orders   Future Appointments Provider Department Dept Phone   07/24/2012 4:40 PM Axel Filler, MD [facility], [place] [phone]   08/14/2012 11:00 AM [facility] Lab 5 [facility] [phone]   08/14/2012 12:45 PM Chuck Hint, MD [facility] [phone]   Future Orders Complete By Expires     Activity as tolerated  As directed     Avoid straining  As directed     Bring all medications to your doctor's appointment  As directed     CALL 911 for chest discomfort not relieved by NTG or lasting longer than 20 minutes  As directed     Call doctor for chest discomfort that is more frequent or severe  As directed     Call doctor for fainting or near blackouts  As directed     Call doctor for shortness of breath, with or without a dry hacking cough  As directed     Call doctor if you have to sleep on extra pillows at night in order to breathe  As directed     STOP ANY ACTIVITY THAT CAUSES CHEST PAIN, SHORTNESS OF BREATH, DIZZINESS, SWEATING OR EXCESSIVE WEAKNESS  As directed     Wound care instructions  As directed     Scheduling Instructions:      May wash with soap and water.    renal diet  As directed        Follow-up Information   Follow up with Lajean Saver, MD. Schedule an appointment as soon as possible for a visit in 2 weeks.   Contact information:   [address] [place] [address] [phone]       Follow up with DICKSON,CHRISTOPHER S, MD In 4 weeks. (office will arrange-sent)    Contact information:   [address] [place] [address] [phone]       Follow up with Lauris Poag, MD. ([facility] will call you with time for your next appt.)    Contact information:   [address] [place] [facility] [place] [address] [phone]        Discharge Vital Signs and Labs: Temp:  [98.5 F (36.9 C)-99.6 F (37.6 C)] 98.5 F (36.9 C) (06/24 1055) Pulse Rate:  [64-86] 64 (06/24 1153) Resp:  [16-21] 16 (06/24 1115) BP: (112-151)/(58-82) 141/68 mmHg (06/24 1153) SpO2:  [98 %-100 %] 100 % (06/24 1115) Weight:  [82.101 kg (181 lb)] 82.101 kg (181 lb) (06/23 2138)  Recent  Labs  07/14/12 0540 07/16/12 0543  WBC 5.6 6.0  HGB 7.7* 8.8*  HCT 22.5* 26.1*  PLT 192 199   Renal:  Recent Labs  07/14/12 0540 07/16/12 0543  NA 137 139  K 3.5 3.8  CL 105 105  CO2 23 24  GLUCOSE 93 99  BUN 45* 40*  CREATININE 7.40* 6.41*  CALCIUM 8.5 8.6  PHOS  --  3.4  ALBUMIN  --  2.2*   35 minutes were spent completing this discharge summary, discharge med reconciliation, discharge patient instructions and communicating discharge information to the patient's dialysis center.  Signed Sheffield Slider, PA-C [facility] [phone] 07/16/2012, 2:05 PM  Attending Physician: Dr. Primitivo Gauze

## 2012-07-16 NOTE — Anesthesia Procedure Notes (Signed)
Procedure Name: MAC Date/Time: 07/16/2012 9:18 AM Performed by: Jerilee Hoh Pre-anesthesia Checklist: Patient identified, Emergency Drugs available, Suction available and Patient being monitored Patient Re-evaluated:Patient Re-evaluated prior to inductionOxygen Delivery Method: Simple face mask Intubation Type: IV induction Placement Confirmation: positive ETCO2 and breath sounds checked- equal and bilateral

## 2012-07-16 NOTE — Op Note (Signed)
NAME: Lori Greer   MRN: [id number] DOB: May 06, 1970    DATE OF OPERATION: 07/16/2012  PREOP DIAGNOSIS: Chronic kidney disease  POSTOP DIAGNOSIS: Same  PROCEDURE: Left brachial cephalic AV fistula  SURGEON: Di Kindle. Edilia Bo, MD, FACS  ASSIST: Della Goo PA  ANESTHESIA: local with sedation   EBL: minimal  INDICATIONS: [name] is a 42 y.o. female who is on peritoneal dialysis. We were asked to place a fistula as a backup.  FINDINGS: 4 mm vein. Small artery  TECHNIQUE: The patient was taken to the operating room and sedated by anesthesia. The left upper extremity was prepped and draped in usual sterile fashion. The cephalic vein was dissected free and ligated distally. It irrigated out with heparinized saline. It was a 4 mm vein. The patient was heparinized. The brachial artery was dissected free beneath the fascia. It was somewhat small. Approximately 3.5 mm. The brachial artery was clamped proximally and distally and a longitudinal arteriotomy was made. The vein was sewn end-to-side to the artery using continuous 6-0 Prolene suture. At the completion was a good thrill in the fistula. There was a radial and ulnar signal with the Doppler. No stasis was obtained in the wound. The wounds closed the deep layer of 3-0 Vicryl and the subcutaneous layer closed with 3-0 Vicryl. The skin was closed with a 4-0 subcuticular stitch. Dermabond was applied. The patient tolerated the procedure well and was transferred to the recovery room in stable condition. All needle and sponge counts were correct.  Waverly Ferrari, MD, FACS [facility]  DATE OF DICTATION:   07/16/2012

## 2012-07-16 NOTE — Interval H&P Note (Signed)
History and Physical Interval Note:  07/16/2012 8:38 AM  Lori Greer  has presented today for surgery, with the diagnosis of ESRD  The various methods of treatment have been discussed with the patient and family. After consideration of risks, benefits and other options for treatment, the patient has consented to  Procedure(s): ARTERIOVENOUS (AV) FISTULA CREATION BRACHIO-CEPHALIC (Left) as a surgical intervention .  The patient's history has been reviewed, patient examined, no change in status, stable for surgery.  I have reviewed the patient's chart and labs.  Questions were answered to the patient's satisfaction.     DICKSON,CHRISTOPHER S

## 2012-07-16 NOTE — Progress Notes (Signed)
Patient discharged home. Patient discharged home with instructions, specifically on AVF care. Patient was told to call MD with fever, increased pain, bleeding, numbness on AVF site. Patient had no new prescriptions. Patient was stable upon discharge.

## 2012-07-16 NOTE — Anesthesia Postprocedure Evaluation (Signed)
  Anesthesia Post-op Note  Patient: Lori Greer  Procedure(s) Performed: Procedure(s): ARTERIOVENOUS (AV) FISTULA CREATION BRACHIO-CEPHALIC (Left)  Patient Location: PACU  Anesthesia Type:MAC  Level of Consciousness: awake, alert  and oriented  Airway and Oxygen Therapy: Patient Spontanous Breathing  Post-op Pain: none  Post-op Assessment: Post-op Vital signs reviewed, Patient's Cardiovascular Status Stable, Respiratory Function Stable, Patent Airway and No signs of Nausea or vomiting  Post-op Vital Signs: Reviewed and stable  Complications: No apparent anesthesia complications

## 2012-07-16 NOTE — Progress Notes (Signed)
[facility] Progress Note  Subjective:  No significant pain in new left AVF. Ready to go home. CAPD without problems.  Objective Filed Vitals:   07/15/12 1706 07/15/12 2138 07/16/12 0608 07/16/12 0834  BP: 151/82 127/63 138/74 139/62  Pulse: 69 76 86 65  Temp: 99.6 F (37.6 C) 99.5 F (37.5 C) 99 F (37.2 C) 99.1 F (37.3 C)  TempSrc:  Oral Oral Oral  Resp: 18 18 18 18   Height:  5\' 2"  (1.575 m)    Weight:  82.101 kg (181 lb)    SpO2: 100% 100% 100% 100%   Physical Exam General: NAD Heart: RRR Lungs: no wheezes or rales Abdomen: soft Extremities: no LE edmea Dialysis Access:  PD cath and new left upper AVF + bruit and thrill  Assessment/Plan: 1. ESRD with Malfunctioning PD cath( ON ADmit instills but wouldn't drain) . 07/13/12 Dr. Antonieta Pert Dx lap = Had lysis of adhesions of the anterior abd wall and intra bowel by Dr. Antonieta Pert K = 3.8 On daily KCl 20 meQ;No uremic symptoms. Wt stable/ resumed CAPD yesterday - ok'd by Dr. Antonieta Pert and so far draining okay per RN staff. PLANS for AVF placement in am for backup is needed. Dr. Ivar Bury follows for PD 2. HTN/volume-bp stable ,vol stable No sob/ hopefully refrain from having to do HD ; metoprolol 50 daily. 3. DM2- on SSI, no diabetic meds at home, 4. CAD s/p DES, on plavix, resumed statin 5. 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 Nutrition - low P diet ; Alb low at 2.2/ renavite 8. Disp -- ok for d/c; educated on how to exercise AVF after one week and also to check for thrill daily; to follow-up with Dr. Lowell Guitar next week.   Sheffield Slider, PA-C [facility] [phone] 07/16/2012,9:36 AM  LOS: 4 days   I have seen and examined this patient and agree with plan per Bard Herbert.  PD catheter worked well yest. 800cc pulled of in last 2  exchanges.  SP LUA AVF placement.  Plan DC today. Xion Debruyne T,MD 07/16/2012 12:06 PM Additional Objective Labs: Basic Metabolic Panel:  Recent Labs Lab 07/13/12 0445 07/14/12 0540 07/16/12 0543  NA 139 137 139  K 3.6 3.5 3.8  CL 103 105 105  CO2 26 23 24   GLUCOSE 100* 93 99  BUN 43* 45* 40*  CREATININE 7.51* 7.40* 6.41*  CALCIUM 8.1* 8.5 8.6  PHOS  --   --  3.4   Liver Function Tests:  Recent Labs Lab 07/13/12 0445 07/16/12 0543  AST 40*  --   ALT 18  --   ALKPHOS 96  --   BILITOT 0.1*  --   PROT 4.9*  --   ALBUMIN 2.2* 2.2*   CBC:  Recent Labs Lab 07/12/12 1109 07/13/12 0445 07/14/12 0540 07/16/12 0543  WBC 6.5 5.4 5.6 6.0  NEUTROABS 3.3  --   --   --   HGB 8.9* 7.6* 7.7* 8.8*  HCT 24.7* 22.0* 22.5* 26.1*  MCV 89.2 91.3 91.5 92.9  PLT 185 180 192 199   Blood Culture    Component Value Date/Time   SDES FLUID PERITONEAL 07/12/2012 1403   SPECREQUEST Normal 07/12/2012 1403   CULT NO GROWTH 3 DAYS 07/12/2012  1403   REPTSTATUS PENDING 07/12/2012 1403    CBG:  Recent Labs Lab 07/15/12 0725 07/15/12 1146 07/15/12 1708 07/15/12 2137 07/16/12 0758  GLUCAP 149* 100* 117* 116* 161*  Medications: . sodium chloride 20 mL/hr at 07/16/12 0855  . [MAR HOLD] heparin     . [facility] HOLD] aspirin EC  81 mg Oral Daily  . [facility] HOLD] calcitRIOL  1 mcg Oral Daily  . cefUROXime      . [facility] HOLD] clopidogrel  75 mg Oral Q breakfast  . [MAR HOLD] darbepoetin  100 mcg Subcutaneous Q7 days  . dextrose      . [MAR HOLD] enoxaparin (LOVENOX) injection  30 mg Subcutaneous Custom  . [MAR HOLD] insulin aspart  0-5 Units Subcutaneous QHS  . [MAR HOLD] insulin aspart  0-9 Units Subcutaneous TID WC  . [facility] HOLD] metoprolol succinate  50 mg Oral Daily  . [facility] HOLD] multivitamin  1 tablet Oral Daily  . [MAR HOLD] polyethylene glycol powder  17 g Oral Daily  . [MAR HOLD] potassium chloride SA  20 mEq Oral Daily  . [facility] HOLD] sevelamer carbonate  1,600 mg Oral TID WC  .  [facility] HOLD] simvastatin  20 mg Oral q1800  . [MAR HOLD] sorbitol  60 mL Oral Once  . [MAR HOLD] sorbitol  60 mL Oral Once

## 2012-07-16 NOTE — Anesthesia Preprocedure Evaluation (Addendum)
Anesthesia Evaluation  Patient identified by MRN, date of birth, ID band Patient awake    Reviewed: Allergy & Precautions, H&P , NPO status , Patient's Chart, lab work & pertinent test results, reviewed documented beta blocker date and time   Airway Mallampati: II TM Distance: >3 FB Neck ROM: Full    Dental no notable dental hx. (+) Edentulous Upper, Edentulous Lower and Dental Advisory Given   Pulmonary neg pulmonary ROS,  breath sounds clear to auscultation  Pulmonary exam normal       Cardiovascular hypertension, On Home Beta Blockers + CAD Rhythm:Regular Rate:Normal     Neuro/Psych negative neurological ROS  negative psych ROS   GI/Hepatic negative GI ROS, Neg liver ROS,   Endo/Other  diabetes, Type 2, Oral Hypoglycemic Agents  Renal/GU ESRF and DialysisRenal disease  negative genitourinary   Musculoskeletal   Abdominal   Peds  Hematology negative hematology ROS (+) anemia ,   Anesthesia Other Findings   Reproductive/Obstetrics negative OB ROS                         Anesthesia Physical Anesthesia Plan  ASA: III  Anesthesia Plan: MAC   Post-op Pain Management:    Induction: Intravenous  Airway Management Planned: Simple Face Mask  Additional Equipment:   Intra-op Plan:   Post-operative Plan:   Informed Consent: I have reviewed the patients History and Physical, chart, labs and discussed the procedure including the risks, benefits and alternatives for the proposed anesthesia with the patient or authorized representative who has indicated his/her understanding and acceptance.   Dental advisory given  Plan Discussed with: CRNA and Surgeon  Anesthesia Plan Comments:         Anesthesia Quick Evaluation

## 2012-07-16 NOTE — H&P (Signed)
[facility] [place]  Brief History and Physical  History of Present Illness  Lori Greer is a 42 y.o. female who presents with chief complaint: recurrent PD catheter malfunction.  The patient presents today for placement of L BC AVF as backup.    Past Medical History  Diagnosis Date  . Bell's palsy 05/12/2008  . DIABETES MELLITUS, TYPE II 03/16/2008  . HYPERTENSION 01/06/2010  . OBESITY 03/16/2008  . Proliferative diabetic retinopathy(362.02) 04/23/2009  . Fibroid, uterine   . ESRD (end stage renal disease)     ESRD on HD  . Anemia   . CAD (coronary artery disease) 01/2012    a. Cardiac cath done for abn nuc 02/23/2012 - 2 vessel CAD s/p DES to mid-LAD, residual disease in the PL OM for medical therapy.   . End-stage renal disease on peritoneal dialysis 03/12/2012    PD cath placed in Mar 2014 and had tunneled HD cath placed in Jan 2014. Having problems as of Jun '14 with PD cath draining poorly .  PD cath repositioned from LUQ to pelvis by Dr Derrell Lolling on 07/07/12 by lap procedure.       Past Surgical History  Procedure Laterality Date  . Appendectomy  2012  . Uterine fibroid surgery    . Abdominal hysterectomy    . Abcess drainage  2002    Right axilla  . Insertion of dialysis catheter  January 2014    Right IJ   . Capd insertion Left 04/08/2012    Procedure: LAPAROSCOPIC INSERTION CONTINUOUS AMBULATORY PERITONEAL DIALYSIS  (CAPD) CATHETER;  Surgeon: Axel Filler, MD;  Location: [facility] OR;  Service: General;  Laterality: Left;  . Laparoscopic repositioning capd catheter N/A 07/08/2012    Procedure:  DIAGNOSTIC LAPAROSCOPY LAPAROSCOPIC REPOSITIONING CAPD CATHETER;  Surgeon: Axel Filler, MD;  Location: [facility] OR;  Service: General;  Laterality: N/A;    History   Social History  . Marital Status: Married    Spouse Name: N/A    Number of Children: 5  . Years of Education: N/A   Occupational History  . CNA    Social History Main Topics  . Smoking status:  Former Smoker    Types: Cigarettes    Quit date: 03/12/2004  . Smokeless tobacco: Never Used  . Alcohol Use: Yes     Comment: rarely  . Drug Use: No  . Sexually Active: Not Currently   Other Topics Concern  . Not on file   Social History Narrative   5 Children          Family History  Problem Relation Age of Onset  . Heart disease Mother     MI at age 69  . Diabetes Father   . Kidney failure Father   . Diabetes Sister     1 sister with diabtes  . Diabetes Brother     No current facility-administered medications on file prior to encounter.   Current Outpatient Prescriptions on File Prior to Encounter  Medication Sig Dispense Refill  . aspirin EC 81 MG EC tablet Take 1 tablet (81 mg total) by mouth daily.      . bisacodyl (DULCOLAX) 5 MG EC tablet Take 5 mg by mouth daily as needed for constipation.      . calcitRIOL (ROCALTROL) 0.5 MCG capsule Take 2 capsules (1 mcg total) by mouth daily.      . clopidogrel (PLAVIX) 75 MG tablet Take 1 tablet (75 mg total) by mouth daily with breakfast.  30  tablet  11  . epoetin alfa (EPOGEN) 10000 UNIT/ML injection Inject 1 mL (10,000 Units total) into the skin 3 (three) times a week.  1 mL    . folic acid-vitamin b complex-vitamin c-selenium-zinc (DIALYVITE) 3 MG TABS Take 1 tablet by mouth daily.      . metoprolol succinate (TOPROL-XL) 50 MG 24 hr tablet Take 50 mg by mouth daily.      . polyethylene glycol powder (GLYCOLAX/MIRALAX) powder Take 17 g by mouth daily.      . potassium chloride SA (K-DUR,KLOR-CON) 20 MEQ tablet Take 20 mEq by mouth daily.      . pravastatin (PRAVACHOL) 40 MG tablet Take 40 mg by mouth every evening.      . sevelamer (RENVELA) 800 MG tablet Take 1,600 mg by mouth 3 (three) times daily with meals. Takes 1600mg  with each meal, and 800mg  with each snack.        No Known Allergies  Review of Systems: As listed above, otherwise negative.  Physical Examination  Filed Vitals:   07/15/12 1629 07/15/12 1706  07/15/12 2138 07/16/12 0608  BP: 133/68 151/82 127/63 138/74  Pulse:  69 76 86  Temp:  99.6 F (37.6 C) 99.5 F (37.5 C) 99 F (37.2 C)  TempSrc:   Oral Oral  Resp:  18 18 18   Height:   5\' 2"  (1.575 m)   Weight:   181 lb (82.101 kg)   SpO2:  100% 100% 100%    General: A&O x 3, WDWN  Pulmonary: Sym exp, good air movt, CTAB, no rales, rhonchi, & wheezing  Cardiac: RRR, Nl S1, S2, no Murmurs, rubs or gallops  Gastrointestinal: soft, NTND, -G/R, - HSM, - masses, - CVAT B, PD catheter  Musculoskeletal: M/S 5/5 throughout , Extremities without ischemic changes   Laboratory See [facility]  Medical Decision Making  Lori Greer is a 42 y.o. female who presents with: recurrent PD catheter malfunction.   The patient is scheduled for: L BC AVF  Risk, benefits, and alternatives to access surgery were discussed.  The patient is aware the risks include but are not limited to: bleeding, infection, steal syndrome, nerve damage, ischemic monomelic neuropathy, failure to mature, and need for additional procedures.  The patient is aware of the risks and agrees to proceed.  Leonides Sake, MD [facility] [place] Office: [phone] Pager: [phone]  07/16/2012, 6:52 AM

## 2012-07-16 NOTE — Telephone Encounter (Signed)
lvm re appt info, sent letter - kf °

## 2012-07-16 NOTE — Transfer of Care (Signed)
Immediate Anesthesia Transfer of Care Note  Patient: Lori Greer  Procedure(s) Performed: Procedure(s): ARTERIOVENOUS (AV) FISTULA CREATION BRACHIO-CEPHALIC (Left)  Patient Location: PACU  Anesthesia Type:MAC  Level of Consciousness: sedated and patient cooperative  Airway & Oxygen Therapy: Patient Spontanous Breathing  Post-op Assessment: Report given to PACU RN, Post -op Vital signs reviewed and stable and Patient moving all extremities  Post vital signs: Reviewed and stable  Complications: No apparent anesthesia complications

## 2012-07-16 NOTE — Telephone Encounter (Signed)
Message copied by Margaretmary Eddy on Tue Jul 16, 2012  1:35 PM ------      Message from: Melene Plan      Created: Tue Jul 16, 2012 10:45 AM                   ----- Message -----         From: Marlowe Shores, PA-C         Sent: 07/16/2012  10:32 AM           To: Melene Plan, RN, [facility]            4-6 weeks left AVF F/U - Edilia Bo ------

## 2012-07-16 NOTE — Progress Notes (Signed)
CBG:69 Treatment: 15 GM carbohydrate snack  Symptoms: Hungry  Follow-up CBG: Time  CBG Result156  Possible Reasons for Event: Inadequate meal intake  Comments/MD notified:

## 2012-07-17 ENCOUNTER — Encounter (HOSPITAL_COMMUNITY): Payer: Self-pay | Admitting: Vascular Surgery

## 2012-07-17 DIAGNOSIS — N186 End stage renal disease: Secondary | ICD-10-CM | POA: Insufficient documentation

## 2012-07-17 DIAGNOSIS — D649 Anemia, unspecified: Secondary | ICD-10-CM | POA: Insufficient documentation

## 2012-07-17 DIAGNOSIS — Z8669 Personal history of other diseases of the nervous system and sense organs: Secondary | ICD-10-CM | POA: Insufficient documentation

## 2012-07-17 DIAGNOSIS — I120 Hypertensive chronic kidney disease with stage 5 chronic kidney disease or end stage renal disease: Secondary | ICD-10-CM | POA: Insufficient documentation

## 2012-07-17 DIAGNOSIS — E11359 Type 2 diabetes mellitus with proliferative diabetic retinopathy without macular edema: Secondary | ICD-10-CM | POA: Insufficient documentation

## 2012-07-17 DIAGNOSIS — Z7902 Long term (current) use of antithrombotics/antiplatelets: Secondary | ICD-10-CM | POA: Insufficient documentation

## 2012-07-17 DIAGNOSIS — E1139 Type 2 diabetes mellitus with other diabetic ophthalmic complication: Secondary | ICD-10-CM | POA: Insufficient documentation

## 2012-07-17 DIAGNOSIS — Z79899 Other long term (current) drug therapy: Secondary | ICD-10-CM | POA: Insufficient documentation

## 2012-07-17 DIAGNOSIS — I2510 Atherosclerotic heart disease of native coronary artery without angina pectoris: Secondary | ICD-10-CM | POA: Insufficient documentation

## 2012-07-17 DIAGNOSIS — Z7982 Long term (current) use of aspirin: Secondary | ICD-10-CM | POA: Insufficient documentation

## 2012-07-17 DIAGNOSIS — R1909 Other intra-abdominal and pelvic swelling, mass and lump: Secondary | ICD-10-CM | POA: Insufficient documentation

## 2012-07-17 DIAGNOSIS — Z87891 Personal history of nicotine dependence: Secondary | ICD-10-CM | POA: Insufficient documentation

## 2012-07-17 DIAGNOSIS — E669 Obesity, unspecified: Secondary | ICD-10-CM | POA: Insufficient documentation

## 2012-07-17 DIAGNOSIS — Z992 Dependence on renal dialysis: Secondary | ICD-10-CM | POA: Insufficient documentation

## 2012-07-17 LAB — COMPREHENSIVE METABOLIC PANEL
ALT: 8 U/L (ref 0–35)
AST: 16 U/L (ref 0–37)
Albumin: 2.5 g/dL — ABNORMAL LOW (ref 3.5–5.2)
Alkaline Phosphatase: 82 U/L (ref 39–117)
BUN: 41 mg/dL — ABNORMAL HIGH (ref 6–23)
CO2: 29 mEq/L (ref 19–32)
Calcium: 8.7 mg/dL (ref 8.4–10.5)
Chloride: 101 mEq/L (ref 96–112)
Creatinine, Ser: 6.63 mg/dL — ABNORMAL HIGH (ref 0.50–1.10)
GFR calc Af Amer: 8 mL/min — ABNORMAL LOW (ref >90)
GFR calc non Af Amer: 7 mL/min — ABNORMAL LOW (ref >90)
Glucose, Bld: 127 mg/dL — ABNORMAL HIGH (ref 70–99)
Potassium: 3.6 mEq/L (ref 3.5–5.1)
Sodium: 138 mEq/L (ref 135–145)
Total Bilirubin: 0.2 mg/dL — ABNORMAL LOW (ref 0.3–1.2)
Total Protein: 5.8 g/dL — ABNORMAL LOW (ref 6.0–8.3)

## 2012-07-17 LAB — TYPE AND SCREEN
ABO/RH(D): O POS
Antibody Screen: NEGATIVE
Unit division: 0
Unit division: 0

## 2012-07-17 LAB — CBC
HCT: 28.4 % — ABNORMAL LOW (ref 36.0–46.0)
Hemoglobin: 9.6 g/dL — ABNORMAL LOW (ref 12.0–15.0)
MCH: 31.6 pg (ref 26.0–34.0)
MCHC: 33.8 g/dL (ref 30.0–36.0)
MCV: 93.4 fL (ref 78.0–100.0)
Platelets: 206 10*3/uL (ref 150–400)
RBC: 3.04 MIL/uL — ABNORMAL LOW (ref 3.87–5.11)
RDW: 16.1 % — ABNORMAL HIGH (ref 11.5–15.5)
WBC: 5.8 10*3/uL (ref 4.0–10.5)

## 2012-07-17 NOTE — ED Notes (Signed)
Pt states she does home dialysis, and recently had surgery to fix her access. Since than her right side abdomin has swollen. States new back pain that is sharp and constqant

## 2012-07-18 ENCOUNTER — Emergency Department (HOSPITAL_COMMUNITY): Payer: Medicare Other

## 2012-07-18 ENCOUNTER — Emergency Department (HOSPITAL_COMMUNITY)
Admission: EM | Admit: 2012-07-18 | Discharge: 2012-07-18 | Disposition: A | Discharge status: Home / Self Care | Payer: Medicare Other | Attending: Emergency Medicine | Admitting: Emergency Medicine

## 2012-07-18 DIAGNOSIS — N186 End stage renal disease: Secondary | ICD-10-CM

## 2012-07-18 DIAGNOSIS — R1900 Intra-abdominal and pelvic swelling, mass and lump, unspecified site: Secondary | ICD-10-CM

## 2012-07-18 NOTE — ED Provider Notes (Signed)
History    CSN: [id number] Arrival date & time 07/17/12  2102  First MD Initiated Contact with Patient 07/18/12 0234     Chief Complaint  Patient presents with  . Bloated   (Consider location/radiation/quality/duration/timing/severity/associated sxs/prior Treatment) HPI Pt presents with c/o continued swelling in right side of her abdomen.  She does peritoneal dialysis for ESRD- she was recently hospitalized for replacement of her dialysis catheter which had not been functioning well and it was operatively replaced from the LUQ to the pelvis.   She also had some lysis of adhesions during surgery per op note.  Since then per chart review and patient the catheter has been functioning well with output the same is input.  She initially complained of right sided abdominal swelling at time of admission and this has continued.  She denies any fever, no vomiting, no change in bowel movements, no abdominal pain.  She states she d/w her nephrologist Dr. Lowell Guitar who recommended she continue her normal PD, but she was concerned about the continued swelling so came to the ED for further evaluation.  She denies any change in the swelling since her recent hospitalization and the PD catheter is functioning normally.  There are no other associated systemic symptoms, there are no other alleviating or modifying factors.  Past Medical History  Diagnosis Date  . Bell's palsy 05/12/2008  . DIABETES MELLITUS, TYPE II 03/16/2008  . HYPERTENSION 01/06/2010  . OBESITY 03/16/2008  . Proliferative diabetic retinopathy(362.02) 04/23/2009  . Fibroid, uterine   . ESRD (end stage renal disease)     ESRD on HD  . Anemia   . CAD (coronary artery disease) 01/2012    a. Cardiac cath done for abn nuc 02/23/2012 - 2 vessel CAD s/p DES to mid-LAD, residual disease in the PL OM for medical therapy.   . End-stage renal disease on peritoneal dialysis 03/12/2012    PD cath placed in Mar 2014 and had tunneled HD cath placed in Jan 2014.  Having problems as of Jun '14 with PD cath draining poorly .  PD cath repositioned from LUQ to pelvis by Dr Derrell Lolling on 07/07/12 by lap procedure.      Past Surgical History  Procedure Laterality Date  . Appendectomy  2012  . Uterine fibroid surgery    . Abdominal hysterectomy    . Abcess drainage  2002    Right axilla  . Insertion of dialysis catheter  January 2014    Right IJ   . Capd insertion Left 04/08/2012    Procedure: LAPAROSCOPIC INSERTION CONTINUOUS AMBULATORY PERITONEAL DIALYSIS  (CAPD) CATHETER;  Surgeon: Axel Filler, MD;  Location: [facility] OR;  Service: General;  Laterality: Left;  . Laparoscopic repositioning capd catheter N/A 07/08/2012    Procedure:  DIAGNOSTIC LAPAROSCOPY LAPAROSCOPIC REPOSITIONING CAPD CATHETER;  Surgeon: Axel Filler, MD;  Location: [facility] OR;  Service: General;  Laterality: N/A;  . Laparoscopy N/A 07/14/2012    Procedure: LAPAROSCOPY DIAGNOSTIC WITH LYSIS OF ADHESIONS AND REPOSITIONING OF PERITONEAL DIALYSIS CATHETER;  Surgeon: Axel Filler, MD;  Location: [facility] OR;  Service: General;  Laterality: N/A;  . Av fistula placement Left 07/16/2012    Procedure: ARTERIOVENOUS (AV) FISTULA CREATION BRACHIO-CEPHALIC;  Surgeon: Chuck Hint, MD;  Location: [facility] OR;  Service: Vascular;  Laterality: Left;   Family History  Problem Relation Age of Onset  . Heart disease Mother     MI at age 70  . Diabetes Father   . Kidney failure Father   . Diabetes Sister  1 sister with diabtes  . Diabetes Brother    History  Substance Use Topics  . Smoking status: Former Smoker    Types: Cigarettes    Quit date: 03/12/2004  . Smokeless tobacco: Never Used  . Alcohol Use: Yes     Comment: rarely   OB History   Grav Para Term Preterm Abortions TAB SAB Ect Mult Living                 Review of Systems ROS reviewed and all otherwise negative except for mentioned in HPI  Allergies  Review of patient's allergies indicates no known allergies.  Home Medications    Current Outpatient Rx  Name  Route  Sig  Dispense  Refill  . aspirin EC 81 MG tablet   Oral   Take 81 mg by mouth daily.         . bisacodyl (DULCOLAX) 5 MG EC tablet   Oral   Take 5 mg by mouth daily as needed for constipation.         . calcitRIOL (ROCALTROL) 0.5 MCG capsule   Oral   Take 1 mcg by mouth daily.         . clopidogrel (PLAVIX) 75 MG tablet   Oral   Take 75 mg by mouth daily.         [name] epoetin alfa (EPOGEN,PROCRIT) 16109 UNIT/ML injection   Subcutaneous   Inject 10,000 Units into the skin 3 (three) times a week.         . folic acid-vitamin b complex-vitamin c-selenium-zinc (DIALYVITE) 3 MG TABS   Oral   Take 1 tablet by mouth daily.         . metoprolol succinate (TOPROL-XL) 50 MG 24 hr tablet   Oral   Take 50 mg by mouth daily.         . polyethylene glycol powder (GLYCOLAX/MIRALAX) powder   Oral   Take 17 g by mouth daily.         . potassium chloride SA (K-DUR,KLOR-CON) 20 MEQ tablet   Oral   Take 20 mEq by mouth daily.         . pravastatin (PRAVACHOL) 40 MG tablet   Oral   Take 40 mg by mouth every evening.         . sevelamer (RENVELA) 800 MG tablet   Oral   Take 1,600 mg by mouth 3 (three) times daily with meals. Takes 1600mg  with each meal, and 800mg  with each snack.          BP 131/73  Pulse 98  Temp(Src) 98.7 F (37.1 C) (Oral)  Resp 18  SpO2 99% Vitals reviewed Physical Exam Physical Examination: General appearance - alert, well appearing, and in no distress Mental status - alert, oriented to person, place, and time Eyes - no conjunctival injection, no scleral icterus Mouth - mucous membranes moist, pharynx normal without lesions Chest - clear to auscultation, no wheezes, rales or rhonchi, symmetric air entry Heart - normal rate, regular rhythm, normal S1, S2, no murmurs, rubs, clicks or gallops Abdomen - soft, nontender, nondistended, no masses or organomegaly, PD catheter in place, well healing  incision sites, appears to have fullness of right side of abdomen but no overlying erythema or tenderness Extremities - peripheral pulses normal, no pedal edema, no clubbing or cyanosis, AV fistula in left arm with positive thrill Skin - normal coloration and turgor, no rashes  ED Course  Procedures (including critical care time) Labs Reviewed  CBC - Abnormal; Notable for the following:    RBC 3.04 (*)    Hemoglobin 9.6 (*)    HCT 28.4 (*)    RDW 16.1 (*)    All other components within normal limits  COMPREHENSIVE METABOLIC PANEL - Abnormal; Notable for the following:    Glucose, Bld 127 (*)    BUN 41 (*)    Creatinine, Ser 6.63 (*)    Total Protein 5.8 (*)    Albumin 2.5 (*)    Total Bilirubin 0.2 (*)    GFR calc non Af Amer 7 (*)    GFR calc Af Amer 8 (*)    All other components within normal limits   Dg Abd 2 Views  07/18/2012   *RADIOLOGY REPORT*  Clinical Data: Abdominal swelling.  Peritoneal dialysis.  ABDOMEN - 2 VIEW  Comparison: 07/12/2012  Findings: Peritoneal dialysis catheter projects over the midline of the pelvis.  Nonobstructive bowel gas pattern.  No free air.  No organomegaly or suspicious calcification.  Lung bases are clear. No acute bony abnormality.  IMPRESSION: No acute findings.   Original Report Authenticated By: Charlett Nose, M.D.   1. Abdominal wall swelling   2. End stage renal disease on dialysis     MDM  Pt presenting with continued right sided abdominal swelling- she states this is the same as during her recent hospital stay.  Abdomen is nontender, no fever.  She states PD catheter is working properly and draining fluid well.  Xray shows PD catheter tip in pelvis.  Doubt SBO, spontaneous bacterial peritonitis, catheter dysfunction or other acute emergent condition at this time.  Pt encouraged to arrange for follow up with Dr. Lowell Guitar, her nephrologist.    Ethelda Chick, MD 07/18/12 [phone]

## 2012-07-22 ENCOUNTER — Telehealth (INDEPENDENT_AMBULATORY_CARE_PROVIDER_SITE_OTHER): Payer: Self-pay | Admitting: General Surgery

## 2012-07-22 NOTE — Telephone Encounter (Signed)
LMOM at 11:25 6/30 asking patient to return my call...just wanting patient to come in sooner like 4:00 on 7/2.Marland Kitchenplease let Lawson Fiscal know if you take this call

## 2012-07-24 ENCOUNTER — Encounter (INDEPENDENT_AMBULATORY_CARE_PROVIDER_SITE_OTHER): Payer: Self-pay | Admitting: General Surgery

## 2012-07-24 ENCOUNTER — Ambulatory Visit (INDEPENDENT_AMBULATORY_CARE_PROVIDER_SITE_OTHER): Payer: Medicare Other | Admitting: General Surgery

## 2012-07-24 VITALS — BP 170/90 | HR 92 | Resp 14 | Ht 62.0 in | Wt 182.6 lb

## 2012-07-24 DIAGNOSIS — Z4902 Encounter for fitting and adjustment of peritoneal dialysis catheter: Secondary | ICD-10-CM

## 2012-07-24 NOTE — Progress Notes (Signed)
Patient ID: Lori Greer, female   DOB: October 02, 1970, 42 y.o.   MRN: [id number] The patient is a 42 year old female status post a laparoscopic repositioning of a PD catheter. The patient has been doing well postoperatively. She most recently underwent an AV fistula, she states his back up into his any issues occur with her PD catheter. Patient also has a left Vas-Cath in place at this time. She states that postoperatively she did undergo CAPD dialysis that went well without issues.  On exam: Her wound is clean dry and intact. Catheters in place.  Assessment and plan: 42 year old female status post PD Catheter repositioning 1. The patient did continue her PD catheter dialysis at any point. 2. Patient followup when necessary

## 2012-08-01 ENCOUNTER — Ambulatory Visit: Admit: 2012-08-01 | Payer: MEDICAID | Admitting: Vascular Surgery

## 2012-08-01 SURGERY — ARTERIOVENOUS (AV) FISTULA CREATION
Anesthesia: Monitor Anesthesia Care | Site: Arm Upper | Laterality: Left

## 2012-08-13 ENCOUNTER — Encounter: Payer: Self-pay | Admitting: Vascular Surgery

## 2012-08-14 ENCOUNTER — Encounter: Payer: Self-pay | Admitting: Vascular Surgery

## 2012-08-14 ENCOUNTER — Encounter (INDEPENDENT_AMBULATORY_CARE_PROVIDER_SITE_OTHER): Payer: Medicare Other | Admitting: *Deleted

## 2012-08-14 ENCOUNTER — Ambulatory Visit: Payer: No Typology Code available for payment source | Admitting: Vascular Surgery

## 2012-08-14 ENCOUNTER — Ambulatory Visit (INDEPENDENT_AMBULATORY_CARE_PROVIDER_SITE_OTHER): Payer: Medicare Other | Admitting: Vascular Surgery

## 2012-08-14 VITALS — BP 125/77 | HR 93 | Ht 62.0 in | Wt 166.5 lb

## 2012-08-14 DIAGNOSIS — Z4931 Encounter for adequacy testing for hemodialysis: Secondary | ICD-10-CM

## 2012-08-14 DIAGNOSIS — Z48812 Encounter for surgical aftercare following surgery on the circulatory system: Secondary | ICD-10-CM

## 2012-08-14 DIAGNOSIS — N186 End stage renal disease: Secondary | ICD-10-CM

## 2012-08-14 NOTE — Progress Notes (Signed)
[facility] [place]  Patient name: Lori Greer MRN: [id number] DOB: 07/10/1970 Sex: female  REASON FOR VISIT: Follow-up of left brachiocephalic AV fistula.  HPI: Lori Greer is a 42 y.o. female does peritoneal dialysis. We were asked to place a fistula as a backup. She had a left brachiocephalic AV fistula placed on 78/29/5621. The artery was noted to be small. The vein was 4 mm. She is one month postop. He denies any pain or paresthesias in the left arm.  REVIEW OF SYSTEMS: [name] ] denotes positive finding; [  ] denotes negative finding  CARDIOVASCULAR:  [ ]  chest pain   [ ]  dyspnea on exertion    CONSTITUTIONAL:  [ ]  fever   [ ]  chills  PHYSICAL EXAM: Filed Vitals:   08/14/12 1539  BP: 125/77  Pulse: 93  Height: 5\' 2"  (1.575 m)  Weight: 166 lb 8 oz (75.524 kg)  SpO2: 100%   Body mass index is 30.45 kg/(m^2). GENERAL: The patient is a well-nourished female, in no acute distress. The vital signs are documented above. CARDIOVASCULAR: There is a regular rate and rhythm  PULMONARY: There is good air exchange bilaterally without wheezing or rales. The fistula has an excellent thrill and bruit. It appears to be maturing adequately. She has a palpable left radial pulse.  Duplex of her fistula shows a patent fistula was then elevated velocities at the proximal anastomosis which is not unusual. No other significant areas of concern are noted.  MEDICAL ISSUES: The fistula appears to be maturing adequately. I'll see her back in 6 weeks with a follow up duplex scan. Hopefully the fistula to be used shortly after that.  Annis Lagoy S [facility] [place] Beeper: [phone]

## 2012-08-16 ENCOUNTER — Inpatient Hospital Stay (HOSPITAL_COMMUNITY)
Admission: EM | Admit: 2012-08-16 | Discharge: 2012-08-21 | DRG: 907 | Disposition: A | Discharge status: Home / Self Care | Payer: Medicare Other | Attending: Internal Medicine | Admitting: Internal Medicine

## 2012-08-16 ENCOUNTER — Encounter (HOSPITAL_COMMUNITY): Payer: Self-pay | Admitting: Emergency Medicine

## 2012-08-16 ENCOUNTER — Inpatient Hospital Stay (HOSPITAL_COMMUNITY): Payer: Medicare Other

## 2012-08-16 ENCOUNTER — Emergency Department (HOSPITAL_COMMUNITY): Payer: Medicare Other

## 2012-08-16 DIAGNOSIS — E669 Obesity, unspecified: Secondary | ICD-10-CM | POA: Diagnosis present

## 2012-08-16 DIAGNOSIS — R112 Nausea with vomiting, unspecified: Secondary | ICD-10-CM

## 2012-08-16 DIAGNOSIS — E872 Acidosis, unspecified: Secondary | ICD-10-CM

## 2012-08-16 DIAGNOSIS — M899 Disorder of bone, unspecified: Secondary | ICD-10-CM | POA: Diagnosis present

## 2012-08-16 DIAGNOSIS — E876 Hypokalemia: Secondary | ICD-10-CM

## 2012-08-16 DIAGNOSIS — Z6832 Body mass index (BMI) 32.0-32.9, adult: Secondary | ICD-10-CM

## 2012-08-16 DIAGNOSIS — Z992 Dependence on renal dialysis: Secondary | ICD-10-CM

## 2012-08-16 DIAGNOSIS — E1165 Type 2 diabetes mellitus with hyperglycemia: Secondary | ICD-10-CM | POA: Diagnosis present

## 2012-08-16 DIAGNOSIS — I2510 Atherosclerotic heart disease of native coronary artery without angina pectoris: Secondary | ICD-10-CM

## 2012-08-16 DIAGNOSIS — E11319 Type 2 diabetes mellitus with unspecified diabetic retinopathy without macular edema: Secondary | ICD-10-CM | POA: Diagnosis present

## 2012-08-16 DIAGNOSIS — K67 Disorders of peritoneum in infectious diseases classified elsewhere: Secondary | ICD-10-CM

## 2012-08-16 DIAGNOSIS — N2581 Secondary hyperparathyroidism of renal origin: Secondary | ICD-10-CM | POA: Diagnosis present

## 2012-08-16 DIAGNOSIS — Z9861 Coronary angioplasty status: Secondary | ICD-10-CM

## 2012-08-16 DIAGNOSIS — E113599 Type 2 diabetes mellitus with proliferative diabetic retinopathy without macular edema, unspecified eye: Secondary | ICD-10-CM | POA: Diagnosis present

## 2012-08-16 DIAGNOSIS — N179 Acute kidney failure, unspecified: Secondary | ICD-10-CM | POA: Diagnosis not present

## 2012-08-16 DIAGNOSIS — K668 Other specified disorders of peritoneum: Secondary | ICD-10-CM

## 2012-08-16 DIAGNOSIS — N058 Unspecified nephritic syndrome with other morphologic changes: Secondary | ICD-10-CM

## 2012-08-16 DIAGNOSIS — E860 Dehydration: Secondary | ICD-10-CM

## 2012-08-16 DIAGNOSIS — M949 Disorder of cartilage, unspecified: Secondary | ICD-10-CM | POA: Diagnosis present

## 2012-08-16 DIAGNOSIS — Z87891 Personal history of nicotine dependence: Secondary | ICD-10-CM

## 2012-08-16 DIAGNOSIS — D649 Anemia, unspecified: Secondary | ICD-10-CM | POA: Diagnosis present

## 2012-08-16 DIAGNOSIS — E1129 Type 2 diabetes mellitus with other diabetic kidney complication: Secondary | ICD-10-CM | POA: Diagnosis present

## 2012-08-16 DIAGNOSIS — Y831 Surgical operation with implant of artificial internal device as the cause of abnormal reaction of the patient, or of later complication, without mention of misadventure at the time of the procedure: Secondary | ICD-10-CM | POA: Diagnosis present

## 2012-08-16 DIAGNOSIS — I10 Essential (primary) hypertension: Secondary | ICD-10-CM

## 2012-08-16 DIAGNOSIS — IMO0002 Reserved for concepts with insufficient information to code with codable children: Secondary | ICD-10-CM

## 2012-08-16 DIAGNOSIS — R739 Hyperglycemia, unspecified: Secondary | ICD-10-CM

## 2012-08-16 DIAGNOSIS — E119 Type 2 diabetes mellitus without complications: Secondary | ICD-10-CM

## 2012-08-16 DIAGNOSIS — K658 Other peritonitis: Secondary | ICD-10-CM | POA: Diagnosis present

## 2012-08-16 DIAGNOSIS — R109 Unspecified abdominal pain: Secondary | ICD-10-CM

## 2012-08-16 DIAGNOSIS — B49 Unspecified mycosis: Secondary | ICD-10-CM | POA: Diagnosis present

## 2012-08-16 DIAGNOSIS — I959 Hypotension, unspecified: Secondary | ICD-10-CM

## 2012-08-16 DIAGNOSIS — N186 End stage renal disease: Secondary | ICD-10-CM

## 2012-08-16 DIAGNOSIS — T8571XA Infection and inflammatory reaction due to peritoneal dialysis catheter, initial encounter: Principal | ICD-10-CM | POA: Diagnosis present

## 2012-08-16 DIAGNOSIS — E1139 Type 2 diabetes mellitus with other diabetic ophthalmic complication: Secondary | ICD-10-CM | POA: Diagnosis present

## 2012-08-16 HISTORY — DX: Shortness of breath: R06.02

## 2012-08-16 LAB — PHOSPHORUS: Phosphorus: 7.5 mg/dL — ABNORMAL HIGH (ref 2.3–4.6)

## 2012-08-16 LAB — CBC
HCT: 47.1 % — ABNORMAL HIGH (ref 36.0–46.0)
Hemoglobin: 16.8 g/dL — ABNORMAL HIGH (ref 12.0–15.0)
MCH: 30.9 pg (ref 26.0–34.0)
MCHC: 35.7 g/dL (ref 30.0–36.0)
MCV: 86.7 fL (ref 78.0–100.0)
Platelets: 359 10*3/uL (ref 150–400)
RBC: 5.43 MIL/uL — ABNORMAL HIGH (ref 3.87–5.11)
RDW: 14.6 % (ref 11.5–15.5)
WBC: 10.9 10*3/uL — ABNORMAL HIGH (ref 4.0–10.5)

## 2012-08-16 LAB — POCT I-STAT, CHEM 8
BUN: 43 mg/dL — ABNORMAL HIGH (ref 6–23)
Calcium, Ion: 1.16 mmol/L (ref 1.12–1.23)
Chloride: 97 mEq/L (ref 96–112)
Creatinine, Ser: 9.1 mg/dL — ABNORMAL HIGH (ref 0.50–1.10)
Glucose, Bld: 526 mg/dL — ABNORMAL HIGH (ref 70–99)
HCT: 54 % — ABNORMAL HIGH (ref 36.0–46.0)
Hemoglobin: 18.4 g/dL — ABNORMAL HIGH (ref 12.0–15.0)
Potassium: 2.4 mEq/L — CL (ref 3.5–5.1)
Sodium: 134 mEq/L — ABNORMAL LOW (ref 135–145)
TCO2: 23 mmol/L (ref 0–100)

## 2012-08-16 LAB — COMPREHENSIVE METABOLIC PANEL
ALT: <5 U/L (ref 0–35)
AST: 30 U/L (ref 0–37)
Albumin: 3.6 g/dL (ref 3.5–5.2)
Alkaline Phosphatase: 122 U/L — ABNORMAL HIGH (ref 39–117)
BUN: 45 mg/dL — ABNORMAL HIGH (ref 6–23)
CO2: 23 mEq/L (ref 19–32)
Calcium: 11.1 mg/dL — ABNORMAL HIGH (ref 8.4–10.5)
Chloride: 88 mEq/L — ABNORMAL LOW (ref 96–112)
Creatinine, Ser: 9.49 mg/dL — ABNORMAL HIGH (ref 0.50–1.10)
GFR calc Af Amer: 5 mL/min — ABNORMAL LOW (ref >90)
GFR calc non Af Amer: 4 mL/min — ABNORMAL LOW (ref >90)
Glucose, Bld: 520 mg/dL — ABNORMAL HIGH (ref 70–99)
Potassium: 2.5 mEq/L — CL (ref 3.5–5.1)
Sodium: 133 mEq/L — ABNORMAL LOW (ref 135–145)
Total Bilirubin: 0.3 mg/dL (ref 0.3–1.2)
Total Protein: 9.6 g/dL — ABNORMAL HIGH (ref 6.0–8.3)

## 2012-08-16 LAB — BODY FLUID CELL COUNT WITH DIFFERENTIAL
Eos, Fluid: 0 %
Lymphs, Fluid: 5 %
Monocyte-Macrophage-Serous Fluid: 43 % — ABNORMAL LOW (ref 50–90)
Neutrophil Count, Fluid: 52 % — ABNORMAL HIGH (ref 0–25)
Total Nucleated Cell Count, Fluid: 73 cu mm (ref 0–1000)

## 2012-08-16 LAB — POCT I-STAT 3, VENOUS BLOOD GAS (G3P V)
Acid-base deficit: 4 mmol/L — ABNORMAL HIGH (ref 0.0–2.0)
Bicarbonate: 24.8 mEq/L — ABNORMAL HIGH (ref 20.0–24.0)
O2 Saturation: 46 %
TCO2: 27 mmol/L (ref 0–100)
pCO2, Ven: 57.2 mmHg — ABNORMAL HIGH (ref 45.0–50.0)
pH, Ven: 7.245 — ABNORMAL LOW (ref 7.250–7.300)
pO2, Ven: 30 mmHg (ref 30.0–45.0)

## 2012-08-16 LAB — PATHOLOGIST SMEAR REVIEW

## 2012-08-16 LAB — GLUCOSE, CAPILLARY
Glucose-Capillary: 101 mg/dL — ABNORMAL HIGH (ref 70–99)
Glucose-Capillary: 127 mg/dL — ABNORMAL HIGH (ref 70–99)
Glucose-Capillary: 149 mg/dL — ABNORMAL HIGH (ref 70–99)

## 2012-08-16 LAB — MAGNESIUM: Magnesium: 2.3 mg/dL (ref 1.5–2.5)

## 2012-08-16 LAB — HEMOGLOBIN A1C
Hgb A1c MFr Bld: 5.7 % — ABNORMAL HIGH (ref <5.7)
Mean Plasma Glucose: 117 mg/dL — ABNORMAL HIGH (ref <117)

## 2012-08-16 LAB — CG4 I-STAT (LACTIC ACID): Lactic Acid, Venous: 3.6 mmol/L — ABNORMAL HIGH (ref 0.5–2.2)

## 2012-08-16 LAB — LIPASE, BLOOD: Lipase: 20 U/L (ref 11–59)

## 2012-08-16 LAB — KETONES, QUALITATIVE: Acetone, Bld: NEGATIVE

## 2012-08-16 MED ORDER — INSULIN ASPART 100 UNIT/ML ~~LOC~~ SOLN
0.0000 [IU] | SUBCUTANEOUS | Status: DC
  Administered 2012-08-16 – 2012-08-17 (×2): 1 [IU] via SUBCUTANEOUS
  Administered 2012-08-17 (×3): 2 [IU] via SUBCUTANEOUS
  Administered 2012-08-18 (×2): 1 [IU] via SUBCUTANEOUS
  Administered 2012-08-19: 2 [IU] via SUBCUTANEOUS
  Administered 2012-08-19: 1 [IU] via SUBCUTANEOUS
  Administered 2012-08-19: 2 [IU] via SUBCUTANEOUS
  Administered 2012-08-19: 1 [IU] via SUBCUTANEOUS
  Administered 2012-08-20 – 2012-08-21 (×2): 2 [IU] via SUBCUTANEOUS

## 2012-08-16 MED ORDER — DELFLEX-LC/1.5% DEXTROSE 346 MOSM/L IP SOLN: Freq: Once | INTRAPERITONEAL | Status: DC

## 2012-08-16 MED ORDER — SODIUM CHLORIDE 0.9 % IV BOLUS (SEPSIS)
1000.0000 mL | Freq: Once | INTRAVENOUS | Status: AC
  Administered 2012-08-16: 1000 mL via INTRAVENOUS

## 2012-08-16 MED ORDER — ACETAMINOPHEN 325 MG PO TABS
650.0000 mg | ORAL_TABLET | Freq: Four times a day (QID) | ORAL | Status: DC | PRN
  Administered 2012-08-16 – 2012-08-19 (×2): 650 mg via ORAL
  Filled 2012-08-16 (×3): qty 2

## 2012-08-16 MED ORDER — INSULIN ASPART 100 UNIT/ML IV SOLN
6.0000 [IU] | Freq: Once | INTRAVENOUS | Status: AC
  Administered 2012-08-16: 6 [IU] via INTRAVENOUS
  Filled 2012-08-16: qty 0.06

## 2012-08-16 MED ORDER — FENTANYL CITRATE 0.05 MG/ML IJ SOLN
25.0000 ug | INTRAMUSCULAR | Status: DC | PRN
  Administered 2012-08-16: 25 ug via INTRAVENOUS
  Filled 2012-08-16: qty 2

## 2012-08-16 MED ORDER — POTASSIUM CHLORIDE 10 MEQ/100ML IV SOLN
10.0000 meq | Freq: Once | INTRAVENOUS | Status: AC
  Administered 2012-08-16: 10 meq via INTRAVENOUS
  Filled 2012-08-16: qty 100

## 2012-08-16 MED ORDER — CHLORHEXIDINE GLUCONATE 4 % EX LIQD
CUTANEOUS | Status: AC
  Filled 2012-08-16: qty 45

## 2012-08-16 MED ORDER — PIPERACILLIN-TAZOBACTAM IN DEX 2-0.25 GM/50ML IV SOLN
2.2500 g | Freq: Two times a day (BID) | INTRAVENOUS | Status: DC
  Administered 2012-08-16 – 2012-08-17 (×3): 2.25 g via INTRAVENOUS
  Filled 2012-08-16 (×4): qty 50

## 2012-08-16 MED ORDER — PIPERACILLIN-TAZOBACTAM 3.375 G IVPB
3.3750 g | Freq: Once | INTRAVENOUS | Status: AC
  Administered 2012-08-16: 3.375 g via INTRAVENOUS
  Filled 2012-08-16: qty 50

## 2012-08-16 MED ORDER — ONDANSETRON HCL 4 MG/2ML IJ SOLN
4.0000 mg | Freq: Once | INTRAMUSCULAR | Status: AC
  Administered 2012-08-16: 4 mg via INTRAVENOUS
  Filled 2012-08-16: qty 2

## 2012-08-16 MED ORDER — ONDANSETRON HCL 4 MG/2ML IJ SOLN
4.0000 mg | Freq: Four times a day (QID) | INTRAMUSCULAR | Status: DC | PRN
  Administered 2012-08-17: 4 mg via INTRAVENOUS
  Filled 2012-08-16: qty 2

## 2012-08-16 MED ORDER — SODIUM CHLORIDE 0.9 % IV SOLN
INTRAVENOUS | Status: AC
  Administered 2012-08-16: 10 mL/h via INTRAVENOUS

## 2012-08-16 MED ORDER — INSULIN DETEMIR 100 UNIT/ML ~~LOC~~ SOLN
5.0000 [IU] | Freq: Once | SUBCUTANEOUS | Status: AC
  Administered 2012-08-16: 5 [IU] via SUBCUTANEOUS
  Filled 2012-08-16: qty 0.05

## 2012-08-16 MED ORDER — IOHEXOL 300 MG/ML  SOLN
25.0000 mL | INTRAMUSCULAR | Status: AC
  Administered 2012-08-16: 25 mL via ORAL

## 2012-08-16 MED ORDER — DELFLEX-LC/1.5% DEXTROSE 346 MOSM/L IP SOLN
INTRAPERITONEAL | Status: DC
  Administered 2012-08-16: 17:00:00 via INTRAPERITONEAL

## 2012-08-16 MED ORDER — SODIUM CHLORIDE 0.9 % IJ SOLN
3.0000 mL | Freq: Two times a day (BID) | INTRAMUSCULAR | Status: DC
  Administered 2012-08-18 – 2012-08-20 (×3): 3 mL via INTRAVENOUS

## 2012-08-16 MED ORDER — SODIUM CHLORIDE 0.9 % IV SOLN: INTRAVENOUS | Status: DC

## 2012-08-16 MED ORDER — SODIUM CHLORIDE 0.9 % IV SOLN: INTRAVENOUS | Status: AC

## 2012-08-16 MED ORDER — ONDANSETRON HCL 4 MG PO TABS: 4.0000 mg | ORAL_TABLET | Freq: Four times a day (QID) | ORAL | Status: DC | PRN

## 2012-08-16 MED ORDER — FENTANYL CITRATE 0.05 MG/ML IJ SOLN
25.0000 ug | INTRAMUSCULAR | Status: DC | PRN
  Administered 2012-08-17 (×5): 25 ug via INTRAVENOUS
  Filled 2012-08-16 (×5): qty 2

## 2012-08-16 NOTE — Procedures (Signed)
LIJV HD catheter removal No comp

## 2012-08-16 NOTE — ED Notes (Signed)
I stat chem 8 and I stat Lactic acid results given to Dr. Dierdre Highman by B. Bing Plume, EMT

## 2012-08-16 NOTE — Progress Notes (Signed)
Pt is under treatment for PD catheter related corynebacterium peritonitis at the time of admission and will complete a 3 week course of antibiotics.  Bard Herbert, PA-C

## 2012-08-16 NOTE — Progress Notes (Signed)
ANTIBIOTIC CONSULT NOTE - INITIAL  Pharmacy Consult for Zosyn Indication: potential intrabdominal infection  No Known Allergies  Patient Measurements: Height: 5\' 1"  (154.9 cm) Weight: 166 lb 7.2 oz (75.5 kg) IBW/kg (Calculated) : 47.8   Vital Signs: Temp: 97.7 F (36.5 C) (07/25 0349) Temp src: Oral (07/25 0349) BP: 128/84 mmHg (07/25 0921) Pulse Rate: 84 (07/25 0921) Intake/Output from previous day:   Intake/Output from this shift:    Labs:  Recent Labs  08/16/12 0400 08/16/12 0404  WBC 10.9*  --   HGB 16.8* 18.4*  PLT 359  --   CREATININE 9.49* 9.10*   Estimated Creatinine Clearance: 7.4 ml/min (by C-G formula based on Cr of 9.1).   Microbiology: Recent Results (from the past 720 hour(s))  BODY FLUID CULTURE     Status: None   Collection Time    08/16/12  4:26 AM      Result Value Range Status   Specimen Description PERITONEAL DIALYSATE   Final   Special Requests NONE   Final   Gram Stain     Final   Value: FEW WBC PRESENT, PREDOMINANTLY PMN     NO ORGANISMS SEEN   Culture PENDING   Incomplete   Report Status PENDING   Incomplete    Medical History: Past Medical History  Diagnosis Date  . Bell's palsy 05/12/2008  . DIABETES MELLITUS, TYPE II 03/16/2008  . HYPERTENSION 01/06/2010  . OBESITY 03/16/2008  . Proliferative diabetic retinopathy(362.02) 04/23/2009  . Fibroid, uterine   . ESRD (end stage renal disease)     ESRD on HD  . Anemia   . CAD (coronary artery disease) 01/2012    a. Cardiac cath done for abn nuc 02/23/2012 - 2 vessel CAD s/p DES to mid-LAD, residual disease in the PL OM for medical therapy.   . End-stage renal disease on peritoneal dialysis 03/12/2012    PD cath placed in Mar 2014 and had tunneled HD cath placed in Jan 2014. Having problems as of Jun '14 with PD cath draining poorly .  PD cath repositioned from LUQ to pelvis by Dr Derrell Lolling on 07/07/12 by lap procedure.       Assessment: Lori Greer with ESRD on PD- per renal, at home she does  [employer] with a long day time dwell. She was on cefazolin as an outpatient for corynebacterium which was to continue through 8/5. She presented with worsening abdominal pain and CT showed free air and per surgery not consistent with a perforation. Pharmacy consulted to dose Zosyn IV empirically. WBC 10.9 and patient currently afebrile.  Goal of Therapy:  Eradication of infection  Plan:  1. Start Zoysn 2.25gm IV Q12hr 2. Follow up cultures/sensitivities, LOT, de-escalation, and clinical changes  Thank you for allowing me to take part in this patient's care,  Shanon Becvar D. Oliviya Gilkison, PharmD Clinical Pharmacist Pager: [phone] 08/16/2012 1:38 PM

## 2012-08-16 NOTE — ED Notes (Signed)
Unable to flush pt.s IV, IV nurse at the bedside.

## 2012-08-16 NOTE — Consult Note (Signed)
Lori Greer 10-Apr-1970  [id number].   Requesting MD: Dr. Dan Humphreys in ED Chief Complaint/Reason for Consult: free air HPI: This is a 42 yo black female with multiple medical problems who is currently undergoing peritoneal dialysis.  She had this placed by Dr. Derrell Lolling and repositioned about a month and half ago.  Apparently, she started having some abdominal pain last night around 6pm.  It persisted.  Apparently, she also had an elevation in her blood pressure.  The patient is a very poor historian and difficult to obtain a good history.  The patient then felt weak and presented to [facility].  She had a CXR which revealed air under her diaphragm.  A CT was ordered which revealed no extravasation, but a significant amount of free air.  There was also some thickening of her third portion of her duodenum noted.  We have been asked to see.  Review of Systems: Please see HPI, otherwise all other systems have been reviewed and are negative  Family History  Problem Relation Age of Onset  . Heart disease Mother     MI at age 47  . Diabetes Father   . Kidney failure Father   . Diabetes Sister     1 sister with diabtes  . Diabetes Brother     Past Medical History  Diagnosis Date  . Bell's palsy 05/12/2008  . DIABETES MELLITUS, TYPE II 03/16/2008  . HYPERTENSION 01/06/2010  . OBESITY 03/16/2008  . Proliferative diabetic retinopathy(362.02) 04/23/2009  . Fibroid, uterine   . ESRD (end stage renal disease)     ESRD on HD  . Anemia   . CAD (coronary artery disease) 01/2012    a. Cardiac cath done for abn nuc 02/23/2012 - 2 vessel CAD s/p DES to mid-LAD, residual disease in the PL OM for medical therapy.   . End-stage renal disease on peritoneal dialysis 03/12/2012    PD cath placed in Mar 2014 and had tunneled HD cath placed in Jan 2014. Having problems as of Jun '14 with PD cath draining poorly .  PD cath repositioned from LUQ to pelvis by Dr Derrell Lolling on 07/07/12 by lap procedure.       Past Surgical  History  Procedure Laterality Date  . Appendectomy  2012  . Uterine fibroid surgery    . Abdominal hysterectomy    . Abcess drainage  2002    Right axilla  . Insertion of dialysis catheter  January 2014    Right IJ   . Capd insertion Left 04/08/2012    Procedure: LAPAROSCOPIC INSERTION CONTINUOUS AMBULATORY PERITONEAL DIALYSIS  (CAPD) CATHETER;  Surgeon: Axel Filler, MD;  Location: [facility] OR;  Service: General;  Laterality: Left;  . Laparoscopic repositioning capd catheter N/A 07/08/2012    Procedure:  DIAGNOSTIC LAPAROSCOPY LAPAROSCOPIC REPOSITIONING CAPD CATHETER;  Surgeon: Axel Filler, MD;  Location: [facility] OR;  Service: General;  Laterality: N/A;  . Laparoscopy N/A 07/14/2012    Procedure: LAPAROSCOPY DIAGNOSTIC WITH LYSIS OF ADHESIONS AND REPOSITIONING OF PERITONEAL DIALYSIS CATHETER;  Surgeon: Axel Filler, MD;  Location: [facility] OR;  Service: General;  Laterality: N/A;  . Av fistula placement Left 07/16/2012    Procedure: ARTERIOVENOUS (AV) FISTULA CREATION BRACHIO-CEPHALIC;  Surgeon: Chuck Hint, MD;  Location: [facility] OR;  Service: Vascular;  Laterality: Left;    Social History:  reports that she quit smoking about 8 years ago. Her smoking use included Cigarettes. She smoked 0.00 packs per day. She has never used smokeless tobacco. She reports that  drinks  alcohol. She reports that she does not use illicit drugs.  Allergies: No Known Allergies   (Not in a hospital admission)  Blood pressure 128/84, pulse 84, temperature 97.7 F (36.5 C), temperature source Oral, resp. rate 16, SpO2 100.00%. Physical Exam: General: WD, WN black female who is laying in bed in NAD HEENT: head is normocephalic, atraumatic.  Sclera are noninjected.  PERRL. Fake eyelashes in place.  Ears and nose without any masses or lesions.  Mouth is pink and moist Heart: regular, rate, and rhythm.  Normal s1,s2. No obvious murmurs, gallops, or rubs noted.  Palpable radial and pedal pulses bilaterally Lungs:  CTAB, no wheezes, rhonchi, or rales noted.  Respiratory effort nonlabored Abd: soft, NT, ND, hypoactive BS, peritoneal dialysis cath in place in LLQ.  no masses, hernias, or organomegaly MS: all 4 extremities are symmetrical with no cyanosis, clubbing, or edema. Skin: warm and dry with no masses, lesions, or rashes Psych: A&Ox3 with an appropriate affect, but somewhat sedated    Results for orders placed during the hospital encounter of 08/16/12 (from the past 48 hour(s))  CBC     Status: Abnormal   Collection Time    08/16/12  4:00 AM      Result Value Range   WBC 10.9 (*) 4.0 - 10.5 K/uL   RBC 5.43 (*) 3.87 - 5.11 MIL/uL   Hemoglobin 16.8 (*) 12.0 - 15.0 g/dL   HCT 40.9 (*) 81.1 - 91.4 %   MCV 86.7  78.0 - 100.0 fL   MCH 30.9  26.0 - 34.0 pg   MCHC 35.7  30.0 - 36.0 g/dL   RDW 78.2  95.6 - 21.3 %   Platelets 359  150 - 400 K/uL  COMPREHENSIVE METABOLIC PANEL     Status: Abnormal   Collection Time    08/16/12  4:00 AM      Result Value Range   Sodium 133 (*) 135 - 145 mEq/L   Potassium 2.5 (*) 3.5 - 5.1 mEq/L   Comment: CRITICAL RESULT CALLED TO, READ BACK BY AND VERIFIED WITH:     Lew Dawes RN [id number] GREEN R   Chloride 88 (*) 96 - 112 mEq/L   CO2 23  19 - 32 mEq/L   Glucose, Bld 520 (*) 70 - 99 mg/dL   BUN 45 (*) 6 - 23 mg/dL   Creatinine, Ser 4.69 (*) 0.50 - 1.10 mg/dL   Calcium 62.9 (*) 8.4 - 10.5 mg/dL   Total Protein 9.6 (*) 6.0 - 8.3 g/dL   Albumin 3.6  3.5 - 5.2 g/dL   AST 30  0 - 37 U/L   ALT <5  0 - 35 U/L   Alkaline Phosphatase 122 (*) 39 - 117 U/L   Total Bilirubin 0.3  0.3 - 1.2 mg/dL   GFR calc non Af Amer 4 (*) >90 mL/min   GFR calc Af Amer 5 (*) >90 mL/min   Comment:            The eGFR has been calculated     using the CKD EPI equation.     This calculation has not been     validated in all clinical     situations.     eGFR's persistently     <90 mL/min signify     possible Chronic Kidney Disease.  POCT I-STAT, CHEM 8     Status: Abnormal    Collection Time    08/16/12  4:04 AM  Result Value Range   Sodium 134 (*) 135 - 145 mEq/L   Potassium 2.4 (*) 3.5 - 5.1 mEq/L   Chloride 97  96 - 112 mEq/L   BUN 43 (*) 6 - 23 mg/dL   Creatinine, Ser 4.09 (*) 0.50 - 1.10 mg/dL   Glucose, Bld 811 (*) 70 - 99 mg/dL   Calcium, Ion 9.14  7.82 - 1.23 mmol/L   TCO2 23  0 - 100 mmol/L   Hemoglobin 18.4 (*) 12.0 - 15.0 g/dL   HCT 95.6 (*) 21.3 - 08.6 %   Comment NOTIFIED PHYSICIAN    CG4 I-STAT (LACTIC ACID)     Status: Abnormal   Collection Time    08/16/12  4:04 AM      Result Value Range   Lactic Acid, Venous 3.60 (*) 0.5 - 2.2 mmol/L  BODY FLUID CELL COUNT WITH DIFFERENTIAL     Status: Abnormal   Collection Time    08/16/12  4:26 AM      Result Value Range   Fluid Type-FCT PERITONEAL DIALYSATE     Color, Fluid COLORLESS (*) YELLOW   Appearance, Fluid CLEAR  CLEAR   WBC, Fluid 73  0 - 1000 cu mm   Neutrophil Count, Fluid 52 (*) 0 - 25 %   Lymphs, Fluid 5     Monocyte-Macrophage-Serous Fluid 43 (*) 50 - 90 %   Eos, Fluid 0    POCT I-STAT 3, BLOOD GAS (G3P V)     Status: Abnormal   Collection Time    08/16/12  7:07 AM      Result Value Range   pH, Ven 7.245 (*) 7.250 - 7.300   pCO2, Ven 57.2 (*) 45.0 - 50.0 mmHg   pO2, Ven 30.0  30.0 - 45.0 mmHg   Bicarbonate 24.8 (*) 20.0 - 24.0 mEq/L   TCO2 27  0 - 100 mmol/L   O2 Saturation 46.0     Acid-base deficit 4.0 (*) 0.0 - 2.0 mmol/L   Sample type VENOUS     Ct Abdomen Pelvis Wo Contrast  08/16/2012   *RADIOLOGY REPORT*  Clinical Data: Diabetic hypertensive patient with end-stage renal disease on peritoneal dialysis.  Feeling weak which became worse when she started peritoneal dialysis.  Intermittent abdominal pain. Question perforation?  Prior hysterectomy and appendectomy.  CT ABDOMEN AND PELVIS WITHOUT CONTRAST  Technique:  Multidetector CT imaging of the abdomen and pelvis was performed following the standard protocol without intravenous contrast.  Comparison: 07/18/2012 plain  film examination.  12/18/2009 CT.  Findings: Peritoneal dialysis catheter enters from the left lower abdomen and the tip curled within the pelvis.  Fluid is noted throughout the abdomen pelvis.  Additionally, there is free intraperitoneal air most notable in the upper abdomen.  This amount of free air could conceivably be related to air introduced at the time of the peritoneal dialysis.  Bowel perforation as cause of free intraperitoneal air is not excluded. There is however, no discrete bowel defect or extraluminal contrast.  Surgical consultation and / or close follow-up imaging recommended.  Free air is more notable adjacent to the stomach.  Thickening of the third portion of the duodenum may represent under distension or mild inflammation.  Narrowing of portions of the colon (such as at the level of the hepatic flexure) may represent result of peristalsis limiting excluding underlying circumferential mass.  Clinical history states the patient is post appendectomy however, radiopaque material right abdomen has an appearance suggestive of the appendix.  Evaluation of  solid abdominal viscera is limited by lack of IV contrast.  Taking this limitation into account no worrisome hepatic, splenic, pancreatic, adrenal or renal lesion.  No calcified gallstone.  Limited for evaluating for cholecystitis given the surrounding fluid.  Ultrasound can be obtained for further delineation if this is of concern.  No bony destructive lesion or CT evidence of disc space infection.  Cardiomegaly.  Suggestion of prominent coronary artery calcifications.  No abdominal aortic aneurysm.  Partial calcification left common iliac artery.  No worrisome adenopathy.  Lung bases clear.  IMPRESSION: Peritoneal dialysis catheter enters from the left lower abdomen and the tip curled within the pelvis.  Fluid is noted throughout the abdomen pelvis.  Additionally, there is free intraperitoneal air most notable in the upper abdomen.  This amount of  free air could conceivably be related to air introduced at the time of the peritoneal dialysis.  Bowel perforation as cause of free intraperitoneal air is not excluded. There is however, no discrete bowel defect or extraluminal contrast.  Surgical consultation and / or close follow-up imaging recommended depending on clinical suspicion.  Please see above discussion.  Critical Value/emergent results were called by telephone at the time of interpretation on 08/16/2012 at 8:00 a.m. to Dr. Dan Humphreys., who verbally acknowledged these results.   Original Report Authenticated By: Lacy Duverney, M.D.   Dg Chest Portable 1 View  08/16/2012   *RADIOLOGY REPORT*  Clinical Data: Hypotension  PORTABLE CHEST - 1 VIEW  Comparison: Prior radiograph from 07/18/2012  Findings: A left-sided dual-lumen hemodialysis catheter is in place with tip overlying the mid SVC.  Cardiomegaly is not significantly changed as compared to prior exam.  Curvilinear lucency is seen overlying the dome of the liver, underneath the right hemidiaphragm, concerning for free intraperitoneal air.  The lungs are clear without airspace consolidation, pleural effusion, or pulmonary edema.  There is no pneumothorax on this semi erect portable exam.  IMPRESSION:  1.  Curvilinear lucency underneath the right hemidiaphragm, concerning for free intraperitoneal air.  This finding is of uncertain clinical etiology, and may be related to the presence of a peritoneal dialysis catheter.  Further evaluation with cross sectional imaging of the abdomen and pelvis could be performed as clinically indicated. 2.  Stable cardiomegaly without edema. 3.  No airspace consolidation to suggest acute infectious pneumonitis.  Critical Value/emergent results were called by telephone at the time of interpretation on 08/16/12 at 0520 am to Sunnie Nielsen by Dr. Hoy Morn, who verbally acknowledged these results.   Original Report Authenticated By: Rise Mu, M.D.        Assessment/Plan 1. Pneumoperitoneum, unknown etiology Patient Active Problem List   Diagnosis Date Noted  . DM type 2, uncontrolled, with renal complications 08/16/2012  . Lactic acidosis 08/16/2012  . Abdominal pain 08/16/2012  . Hypokalemia 08/16/2012  . Nausea and vomiting 08/16/2012  . Dehydration 08/16/2012  . End stage renal disease 08/14/2012  . Anemia 07/16/2012  . Secondary hyperparathyroidism (of renal origin) 07/16/2012  . End-stage renal disease on peritoneal dialysis 03/12/2012  . CAD (coronary artery disease) 02/24/2012  . Hypertension 01/06/2010  . Proliferative diabetic retinopathy 04/23/2009  . Diabetes mellitus 03/16/2008  . Obesity 03/16/2008   Plan: 1. The patient, despite having a good amount of free air on her CT scan, has a completely benign abdomen.  She is ND and has NO pain.  Given these findings, we will treat the patient with conservative management of NPO and bowel rest.  If the patient develops  worsening pain, she may require surgical intervention. 2. Agree with medicine admission. 3. Prophylactic IV abx therapy is recommended. 4. Will follow closely.   Samariah Hokenson E 08/16/2012, 10:40 AM Pager: [phone]

## 2012-08-16 NOTE — ED Provider Notes (Signed)
CSN: [id number]     Arrival date & time 08/16/12  0340 History     First MD Initiated Contact with Patient 08/16/12 [phone]     Chief Complaint  Patient presents with  . Hypotension   (Consider location/radiation/quality/duration/timing/severity/associated sxs/prior Treatment) HPI History provided by patient. Diabetic with end-stage renal disease, performs peritoneal dialysis at home since March 2014. Tonight at home she developed nausea and abdominal pain and found her blood pressure to be in the 90s. She's had some generalized weakness all day long. Symptoms worsened with her peritoneal dialysis. No fevers. No vomiting. No rash. No known sick contacts. No chest pain or shortness of breath. Symptoms moderate in severity without known alleviating factors. Followed by [facility] and Dr. Lowell Guitar nephrology  Past Medical History  Diagnosis Date  . Bell's palsy 05/12/2008  . DIABETES MELLITUS, TYPE II 03/16/2008  . HYPERTENSION 01/06/2010  . OBESITY 03/16/2008  . Proliferative diabetic retinopathy(362.02) 04/23/2009  . Fibroid, uterine   . ESRD (end stage renal disease)     ESRD on HD  . Anemia   . CAD (coronary artery disease) 01/2012    a. Cardiac cath done for abn nuc 02/23/2012 - 2 vessel CAD s/p DES to mid-LAD, residual disease in the PL OM for medical therapy.   . End-stage renal disease on peritoneal dialysis 03/12/2012    PD cath placed in Mar 2014 and had tunneled HD cath placed in Jan 2014. Having problems as of Jun '14 with PD cath draining poorly .  PD cath repositioned from LUQ to pelvis by Dr Derrell Lolling on 07/07/12 by lap procedure.      Past Surgical History  Procedure Laterality Date  . Appendectomy  2012  . Uterine fibroid surgery    . Abdominal hysterectomy    . Abcess drainage  2002    Right axilla  . Insertion of dialysis catheter  January 2014    Right IJ   . Capd insertion Left 04/08/2012    Procedure: LAPAROSCOPIC INSERTION CONTINUOUS AMBULATORY PERITONEAL  DIALYSIS  (CAPD) CATHETER;  Surgeon: Axel Filler, MD;  Location: [facility] OR;  Service: General;  Laterality: Left;  . Laparoscopic repositioning capd catheter N/A 07/08/2012    Procedure:  DIAGNOSTIC LAPAROSCOPY LAPAROSCOPIC REPOSITIONING CAPD CATHETER;  Surgeon: Axel Filler, MD;  Location: [facility] OR;  Service: General;  Laterality: N/A;  . Laparoscopy N/A 07/14/2012    Procedure: LAPAROSCOPY DIAGNOSTIC WITH LYSIS OF ADHESIONS AND REPOSITIONING OF PERITONEAL DIALYSIS CATHETER;  Surgeon: Axel Filler, MD;  Location: [facility] OR;  Service: General;  Laterality: N/A;  . Av fistula placement Left 07/16/2012    Procedure: ARTERIOVENOUS (AV) FISTULA CREATION BRACHIO-CEPHALIC;  Surgeon: Chuck Hint, MD;  Location: [facility] OR;  Service: Vascular;  Laterality: Left;   Family History  Problem Relation Age of Onset  . Heart disease Mother     MI at age 26  . Diabetes Father   . Kidney failure Father   . Diabetes Sister     1 sister with diabtes  . Diabetes Brother    History  Substance Use Topics  . Smoking status: Former Smoker    Types: Cigarettes    Quit date: 03/12/2004  . Smokeless tobacco: Never Used  . Alcohol Use: Yes     Comment: rarely   OB History   Grav Para Term Preterm Abortions TAB SAB Ect Mult Living                 Review of Systems  Constitutional: Negative  for fever and chills.  HENT: Negative for neck pain and neck stiffness.   Eyes: Negative for visual disturbance.  Respiratory: Negative for shortness of breath.   Cardiovascular: Negative for chest pain.  Gastrointestinal: Positive for nausea and abdominal pain.  Genitourinary: Negative for dysuria.  Musculoskeletal: Negative for back pain.  Skin: Negative for rash.  Neurological: Positive for light-headedness. Negative for headaches.  All other systems reviewed and are negative.    Allergies  Review of patient's allergies indicates no known allergies.  Home Medications   Current Outpatient Rx  Name  Route   Sig  Dispense  Refill  . aspirin EC 81 MG tablet   Oral   Take 81 mg by mouth daily.         . bisacodyl (DULCOLAX) 5 MG EC tablet   Oral   Take 5 mg by mouth daily as needed for constipation.         . calcitRIOL (ROCALTROL) 0.5 MCG capsule   Oral   Take 1 mcg by mouth daily.         . clopidogrel (PLAVIX) 75 MG tablet   Oral   Take 75 mg by mouth daily.         [name] epoetin alfa (EPOGEN,PROCRIT) 16109 UNIT/ML injection   Subcutaneous   Inject 10,000 Units into the skin 3 (three) times a week.         . folic acid-vitamin b complex-vitamin c-selenium-zinc (DIALYVITE) 3 MG TABS   Oral   Take 1 tablet by mouth daily.         . metoprolol succinate (TOPROL-XL) 50 MG 24 hr tablet   Oral   Take 50 mg by mouth daily.         . polyethylene glycol powder (GLYCOLAX/MIRALAX) powder   Oral   Take 17 g by mouth daily.         . potassium chloride SA (K-DUR,KLOR-CON) 20 MEQ tablet   Oral   Take 20 mEq by mouth daily.         . pravastatin (PRAVACHOL) 40 MG tablet   Oral   Take 40 mg by mouth every evening.         . sevelamer (RENVELA) 800 MG tablet   Oral   Take 1,600 mg by mouth 3 (three) times daily with meals. Takes 1600mg  with each meal, and 800mg  with each snack.          BP 92/62  Pulse 83  Temp(Src) 97.7 F (36.5 C) (Oral)  Resp 16  SpO2 100% Physical Exam  Constitutional: She is oriented to person, place, and time. She appears well-developed and well-nourished.  HENT:  Head: Normocephalic and atraumatic.  Eyes: EOM are normal. Pupils are equal, round, and reactive to light.  Neck: Neck supple.  Cardiovascular: Normal rate, regular rhythm and intact distal pulses.   Pulmonary/Chest: Effort normal and breath sounds normal. No stridor. No respiratory distress. She exhibits no tenderness.  Abdominal: Soft. Bowel sounds are normal. She exhibits no distension.  Mild diffuse tenderness without acute abdomen  Musculoskeletal: Normal range of  motion. She exhibits no edema.  Neurological: She is alert and oriented to person, place, and time.  Skin: Skin is warm and dry.    ED Course   Procedures (including critical care time)  Results for orders placed during the hospital encounter of 08/16/12  CBC      Result Value Range   WBC 10.9 (*) 4.0 - 10.5 K/uL   RBC 5.43 (*)  3.87 - 5.11 MIL/uL   Hemoglobin 16.8 (*) 12.0 - 15.0 g/dL   HCT 40.9 (*) 81.1 - 91.4 %   MCV 86.7  78.0 - 100.0 fL   MCH 30.9  26.0 - 34.0 pg   MCHC 35.7  30.0 - 36.0 g/dL   RDW 78.2  95.6 - 21.3 %   Platelets 359  150 - 400 K/uL  COMPREHENSIVE METABOLIC PANEL      Result Value Range   Sodium 133 (*) 135 - 145 mEq/L   Potassium 2.5 (*) 3.5 - 5.1 mEq/L   Chloride 88 (*) 96 - 112 mEq/L   CO2 23  19 - 32 mEq/L   Glucose, Bld 520 (*) 70 - 99 mg/dL   BUN 45 (*) 6 - 23 mg/dL   Creatinine, Ser 0.86 (*) 0.50 - 1.10 mg/dL   Calcium 57.8 (*) 8.4 - 10.5 mg/dL   Total Protein 9.6 (*) 6.0 - 8.3 g/dL   Albumin 3.6  3.5 - 5.2 g/dL   AST 30  0 - 37 U/L   ALT <5  0 - 35 U/L   Alkaline Phosphatase 122 (*) 39 - 117 U/L   Total Bilirubin 0.3  0.3 - 1.2 mg/dL   GFR calc non Af Amer 4 (*) >90 mL/min   GFR calc Af Amer 5 (*) >90 mL/min  BODY FLUID CELL COUNT WITH DIFFERENTIAL      Result Value Range   Fluid Type-FCT PERITONEAL DIALYSATE     Color, Fluid COLORLESS (*) YELLOW   Appearance, Fluid CLEAR  CLEAR   WBC, Fluid 73  0 - 1000 cu mm   Neutrophil Count, Fluid 52 (*) 0 - 25 %   Lymphs, Fluid 5     Monocyte-Macrophage-Serous Fluid 43 (*) 50 - 90 %   Eos, Fluid 0    POCT I-STAT, CHEM 8      Result Value Range   Sodium 134 (*) 135 - 145 mEq/L   Potassium 2.4 (*) 3.5 - 5.1 mEq/L   Chloride 97  96 - 112 mEq/L   BUN 43 (*) 6 - 23 mg/dL   Creatinine, Ser 4.69 (*) 0.50 - 1.10 mg/dL   Glucose, Bld 629 (*) 70 - 99 mg/dL   Calcium, Ion 5.28  4.13 - 1.23 mmol/L   TCO2 23  0 - 100 mmol/L   Hemoglobin 18.4 (*) 12.0 - 15.0 g/dL   HCT 24.4 (*) 01.0 - 27.2 %   Comment  NOTIFIED PHYSICIAN    CG4 I-STAT (LACTIC ACID)      Result Value Range   Lactic Acid, Venous 3.60 (*) 0.5 - 2.2 mmol/L   Dg Chest Portable 1 View  08/16/2012   *RADIOLOGY REPORT*  Clinical Data: Hypotension  PORTABLE CHEST - 1 VIEW  Comparison: Prior radiograph from 07/18/2012  Findings: A left-sided dual-lumen hemodialysis catheter is in place with tip overlying the mid SVC.  Cardiomegaly is not significantly changed as compared to prior exam.  Curvilinear lucency is seen overlying the dome of the liver, underneath the right hemidiaphragm, concerning for free intraperitoneal air.  The lungs are clear without airspace consolidation, pleural effusion, or pulmonary edema.  There is no pneumothorax on this semi erect portable exam.  IMPRESSION:  1.  Curvilinear lucency underneath the right hemidiaphragm, concerning for free intraperitoneal air.  This finding is of uncertain clinical etiology, and may be related to the presence of a peritoneal dialysis catheter.  Further evaluation with cross sectional imaging of the abdomen and pelvis  could be performed as clinically indicated. 2.  Stable cardiomegaly without edema. 3.  No airspace consolidation to suggest acute infectious pneumonitis.  Critical Value/emergent results were called by telephone at the time of interpretation on 08/16/12 at 0520 am to Sunnie Nielsen by Dr. Hoy Morn, who verbally acknowledged these results.   Original Report Authenticated By: Rise Mu, M.D.    CRITICAL CARE Performed by: Sunnie Nielsen Total critical care time: 30 Critical care time was exclusive of separately billable procedures and treating other patients. Critical care was necessary to treat or prevent imminent or life-threatening deterioration. Critical care was time spent personally by me on the following activities: development of treatment plan with patient and/or surrogate as well as nursing, discussions with consultants, evaluation of patient's response to  treatment, examination of patient, obtaining history from patient or surrogate, ordering and performing treatments and interventions, ordering and review of laboratory studies, ordering and review of radiographic studies, pulse oximetry and re-evaluation of patient's condition. SBP 80-90s requiring IVF resuscitation. CXR shows possible free air, evaluated further with CT. Lactic acidosis. Hypokalemia and hyperglycemia. Case initially discussed with Dr Toniann Fail, for possible free air on CXR, plan CT scan and MED versus surgical admit. IVFs continued, holding insulin for now given hypokalemia, continued potassium infusions. Dialysis RN presenting to the ED to obtain peritoneal diasylate fluid - sent for analysis. CT ordered and PT care transferred to Dr Dan Humphreys pending Ct results.   MDM  ABD pain, N/V on PD Found to be hyperglycemic and hypokalemic IV fluids provided. IV narcotics pain control Chest x-ray concerning for free air CT scan ordered Admit medicine vs surgery  Sunnie Nielsen, MD 08/17/12 [id number]

## 2012-08-16 NOTE — H&P (Signed)
[facility] History and Physical  Lori Greer ZOX:[id number] DOB: Jun 14, 1970 DOA: 08/16/2012  Referring physician: Dan Humphreys PCP: Rogelia Boga, MD  Specialists: Kar[facility] Plumber [facility]tz  Chief Complaint: weakness, abdominal pain  HPI: Lori Greer is a 42 y.o. female who came to the emergency room complaining of weakness and lower abdominal pain. She has end-stage renal disease on peritoneal dialysis. She has had no fevers or chills. She has had intermittent vomiting for about a week. No diarrhea. She feels dizzy with standing. She has a history of diabetes, but only checks her blood sugars once daily and takes intermittent insulin. She does not know what type of insulin. She is concerned that her dialysis catheter is leaking. Her appetite has been poor. She still makes a small amount of urine daily. In the emergency room, she was found to be hypokalemic with a potassium of 2.4. Lactic acidosis with a lactate of 3.4. Blood glucose is 500. Bicarbonate is 23. Anion gap is 22. Liver function tests unremarkable. White blood cell count 10,900 BUN is 45. Creatinine 9.49. Last month creatinine was 6.3. Patient is usually anemic but her hemoglobin today is 16.8. Blood pressure initially was borderline with a systolic in the low 90s. Patient has no chest pain or shortness of breath. Peritoneal fluid shows 73 white cells. Gram stain is pending. X-ray of the abdomen. Chest x-ray negative. CT of the abdomen shows free air but no definite abscess or perforation (can be seen with peritoneal dialysis). Patient received Zofran and fentanyl and currently feels a lot better. She has been seen by Dr. Magnus Ivan who recommends bowel rest, observation on the hospitalist service, and empiric antibiotics. Reassess tomorrow.  In the emergency room in addition to the pain in nausea medications, she has received potassium, but no insulin. She has also been started on IV fluid  Review of Systems: Systems reviewed.  As above otherwise negative.  Past Medical History  Diagnosis Date  . Bell's palsy 05/12/2008  . DIABETES MELLITUS, TYPE II 03/16/2008  . HYPERTENSION 01/06/2010  . OBESITY 03/16/2008  . Proliferative diabetic retinopathy(362.02) 04/23/2009  . Fibroid, uterine   . ESRD (end stage renal disease)     ESRD on HD  . Anemia   . CAD (coronary artery disease) 01/2012    a. Cardiac cath done for abn nuc 02/23/2012 - 2 vessel CAD s/p DES to mid-LAD, residual disease in the PL OM for medical therapy.   . End-stage renal disease on peritoneal dialysis 03/12/2012    PD cath placed in Mar 2014 and had tunneled HD cath placed in Jan 2014. Having problems as of Jun '14 with PD cath draining poorly .  PD cath repositioned from LUQ to pelvis by Dr Derrell Lolling on 07/07/12 by lap procedure.      Past Surgical History  Procedure Laterality Date  . Appendectomy  2012  . Uterine fibroid surgery    . Abdominal hysterectomy    . Abcess drainage  2002    Right axilla  . Insertion of dialysis catheter  January 2014    Right IJ   . Capd insertion Left 04/08/2012    Procedure: LAPAROSCOPIC INSERTION CONTINUOUS AMBULATORY PERITONEAL DIALYSIS  (CAPD) CATHETER;  Surgeon: Axel Filler, MD;  Location: [facility] OR;  Service: General;  Laterality: Left;  . Laparoscopic repositioning capd catheter N/A 07/08/2012    Procedure:  DIAGNOSTIC LAPAROSCOPY LAPAROSCOPIC REPOSITIONING CAPD CATHETER;  Surgeon: Axel Filler, MD;  Location: [facility] OR;  Service: General;  Laterality: N/A;  . Laparoscopy  N/A 07/14/2012    Procedure: LAPAROSCOPY DIAGNOSTIC WITH LYSIS OF ADHESIONS AND REPOSITIONING OF PERITONEAL DIALYSIS CATHETER;  Surgeon: Axel Filler, MD;  Location: [facility] OR;  Service: General;  Laterality: N/A;  . Av fistula placement Left 07/16/2012    Procedure: ARTERIOVENOUS (AV) FISTULA CREATION BRACHIO-CEPHALIC;  Surgeon: Chuck Hint, MD;  Location: [facility] OR;  Service: Vascular;  Laterality: Left;   Social History:  reports that she  quit smoking about 8 years ago. Her smoking use included Cigarettes. She smoked 0.00 packs per day. She has never used smokeless tobacco. She reports that  drinks alcohol. She reports that she does not use illicit drugs.  No Known Allergies  Family History  Problem Relation Age of Onset  . Heart disease Mother     MI at age 35  . Diabetes Father   . Kidney failure Father   . Diabetes Sister     1 sister with diabtes  . Diabetes Brother     Prior to Admission medications   Medication Sig Start Date End Date Taking? Authorizing Provider  aspirin EC 81 MG tablet Take 81 mg by mouth daily.   Yes Historical Provider, MD  bisacodyl (DULCOLAX) 5 MG EC tablet Take 5 mg by mouth daily as needed for constipation.   Yes Historical Provider, MD  calcitRIOL (ROCALTROL) 0.5 MCG capsule Take 1 mcg by mouth daily.   Yes Historical Provider, MD  clopidogrel (PLAVIX) 75 MG tablet Take 75 mg by mouth daily.   Yes Historical Provider, MD  folic acid-vitamin b complex-vitamin c-selenium-zinc (DIALYVITE) 3 MG TABS Take 1 tablet by mouth daily.   Yes Historical Provider, MD  metoprolol succinate (TOPROL-XL) 50 MG 24 hr tablet Take 50 mg by mouth daily. 04/10/12  Yes Lewayne Bunting, MD  polyethylene glycol powder (GLYCOLAX/MIRALAX) powder Take 17 g by mouth daily.   Yes Historical Provider, MD  pravastatin (PRAVACHOL) 40 MG tablet Take 40 mg by mouth every evening.   Yes Historical Provider, MD  sevelamer (RENVELA) 800 MG tablet Take 1,600 mg by mouth 3 (three) times daily with meals. Takes 1600mg  with each meal, and 800mg  with each snack.   Yes Historical Provider, MD   Physical Exam: Filed Vitals:   08/16/12 0700 08/16/12 0715 08/16/12 0920 08/16/12 0921  BP: 129/76 143/76 128/84 128/84  Pulse: 79 80 88 84  Temp:      TempSrc:      Resp: 25 15  16   SpO2: 100% 100% 100% 100%  BP 128/84  Pulse 84  Temp(Src) 97.7 F (36.5 C) (Oral)  Resp 16  SpO2 100%  General Appearance:    comfortable, alert  African American female   Head:    Normocephalic, without obvious abnormality, atraumatic  Eyes:    PERRL, conjunctiva/corneas clear, EOM's intact, fundi    benign, both eyes     Nose:   Nares normal, septum midline, mucosa normal, no drainage    or sinus tenderness  Throat:   Lips, mucosa, and tongue normal; teeth and gums normal  Neck:   Supple, symmetrical, trachea midline, no adenopathy;    thyroid:  no enlargement/tenderness/nodules; no carotid   bruit or JVD  Back:     Symmetric, no curvature, ROM normal, no CVA tenderness  Lungs:     Clear to auscultation bilaterally, respirations unlabored  Chest Wall:    No tenderness or deformity   Heart:    Regular rate and rhythm, S1 and S2 normal, no murmur, rub   or  gallop     Abdomen:     Soft, non-tender, nondistended. Peritoneal dialysis catheter without signs of leakage   Genitalia:   deferred   Rectal:   deferred   Extremities:   Extremities normal, atraumatic, no cyanosis or edema  Pulses:   2+ and symmetric all extremities  Skin:   Skin color, texture, turgor normal, no rashes or lesions  Lymph nodes:   Cervical, supraclavicular, and axillary nodes normal  Neurologic:   CNII-XII intact, normal strength, sensation and reflexes    throughout    Psychiatric: Flat affect. Cooperative   Labs on Admission:  Basic Metabolic Panel:  Recent Labs Lab 08/16/12 0400 08/16/12 0404  NA 133* 134*  K 2.5* 2.4*  CL 88* 97  CO2 23  --   GLUCOSE 520* 526*  BUN 45* 43*  CREATININE 9.49* 9.10*  CALCIUM 11.1*  --    Liver Function Tests:  Recent Labs Lab 08/16/12 0400  AST 30  ALT <5  ALKPHOS 122*  BILITOT 0.3  PROT 9.6*  ALBUMIN 3.6   No results found for this basename: LIPASE, AMYLASE,  in the last 168 hours No results found for this basename: AMMONIA,  in the last 168 hours CBC:  Recent Labs Lab 08/16/12 0400 08/16/12 0404  WBC 10.9*  --   HGB 16.8* 18.4*  HCT 47.1* 54.0*  MCV 86.7  --   PLT 359  --    Cardiac  Enzymes: No results found for this basename: CKTOTAL, CKMB, CKMBINDEX, TROPONINI,  in the last 168 hours  BNP (last 3 results) No results found for this basename: PROBNP,  in the last 8760 hours CBG: No results found for this basename: GLUCAP,  in the last 168 hours  Radiological Exams on Admission: Ct Abdomen Pelvis Wo Contrast  08/16/2012   *RADIOLOGY REPORT*  Clinical Data: Diabetic hypertensive patient with end-stage renal disease on peritoneal dialysis.  Feeling weak which became worse when she started peritoneal dialysis.  Intermittent abdominal pain. Question perforation?  Prior hysterectomy and appendectomy.  CT ABDOMEN AND PELVIS WITHOUT CONTRAST  Technique:  Multidetector CT imaging of the abdomen and pelvis was performed following the standard protocol without intravenous contrast.  Comparison: 07/18/2012 plain film examination.  12/18/2009 CT.  Findings: Peritoneal dialysis catheter enters from the left lower abdomen and the tip curled within the pelvis.  Fluid is noted throughout the abdomen pelvis.  Additionally, there is free intraperitoneal air most notable in the upper abdomen.  This amount of free air could conceivably be related to air introduced at the time of the peritoneal dialysis.  Bowel perforation as cause of free intraperitoneal air is not excluded. There is however, no discrete bowel defect or extraluminal contrast.  Surgical consultation and / or close follow-up imaging recommended.  Free air is more notable adjacent to the stomach.  Thickening of the third portion of the duodenum may represent under distension or mild inflammation.  Narrowing of portions of the colon (such as at the level of the hepatic flexure) may represent result of peristalsis limiting excluding underlying circumferential mass.  Clinical history states the patient is post appendectomy however, radiopaque material right abdomen has an appearance suggestive of the appendix.  Evaluation of solid abdominal  viscera is limited by lack of IV contrast.  Taking this limitation into account no worrisome hepatic, splenic, pancreatic, adrenal or renal lesion.  No calcified gallstone.  Limited for evaluating for cholecystitis given the surrounding fluid.  Ultrasound can be obtained for further delineation  if this is of concern.  No bony destructive lesion or CT evidence of disc space infection.  Cardiomegaly.  Suggestion of prominent coronary artery calcifications.  No abdominal aortic aneurysm.  Partial calcification left common iliac artery.  No worrisome adenopathy.  Lung bases clear.  IMPRESSION: Peritoneal dialysis catheter enters from the left lower abdomen and the tip curled within the pelvis.  Fluid is noted throughout the abdomen pelvis.  Additionally, there is free intraperitoneal air most notable in the upper abdomen.  This amount of free air could conceivably be related to air introduced at the time of the peritoneal dialysis.  Bowel perforation as cause of free intraperitoneal air is not excluded. There is however, no discrete bowel defect or extraluminal contrast.  Surgical consultation and / or close follow-up imaging recommended depending on clinical suspicion.  Please see above discussion.  Critical Value/emergent results were called by telephone at the time of interpretation on 08/16/2012 at 8:00 a.m. to Dr. Dan Humphreys., who verbally acknowledged these results.   Original Report Authenticated By: Lacy Duverney, M.D.   Dg Chest Portable 1 View  08/16/2012   *RADIOLOGY REPORT*  Clinical Data: Hypotension  PORTABLE CHEST - 1 VIEW  Comparison: Prior radiograph from 07/18/2012  Findings: A left-sided dual-lumen hemodialysis catheter is in place with tip overlying the mid SVC.  Cardiomegaly is not significantly changed as compared to prior exam.  Curvilinear lucency is seen overlying the dome of the liver, underneath the right hemidiaphragm, concerning for free intraperitoneal air.  The lungs are clear without  airspace consolidation, pleural effusion, or pulmonary edema.  There is no pneumothorax on this semi erect portable exam.  IMPRESSION:  1.  Curvilinear lucency underneath the right hemidiaphragm, concerning for free intraperitoneal air.  This finding is of uncertain clinical etiology, and may be related to the presence of a peritoneal dialysis catheter.  Further evaluation with cross sectional imaging of the abdomen and pelvis could be performed as clinically indicated. 2.  Stable cardiomegaly without edema. 3.  No airspace consolidation to suggest acute infectious pneumonitis.  Critical Value/emergent results were called by telephone at the time of interpretation on 08/16/12 at 0520 am to Sunnie Nielsen by Dr. Hoy Morn, who verbally acknowledged these results.   Original Report Authenticated By: Rise Mu, M.D.    EKG: NSR. Unchanged from previous  Assessment/Plan Principal Problem:   Abdominal pain with weakness and lactic acidosis: agree with admission, bowel rest, empiric abx, serial abdominal exams.  Free air seen on imaging could be related to peritoneal dialysis.  UA pending. Could also be DKA, though anion gap difficult to interpret in this case. Will check acetone. Pain and nausea meds as needed.  Discussed with Dr. Magnus Ivan. Active Problems:   Proliferative diabetic retinopathy   Hypertension: hold meds   CAD (coronary artery disease)   End-stage renal disease on peritoneal dialysis: Discussed with Dr. Arta Silence who will consult   Secondary hyperparathyroidism (of renal origin)   DM type 2, uncontrolled, with renal complications:  See above.  Check acetone. hgb a1c. Only takes insulin prn and checks CBGs no more than once per day. Pt reports CBG usually in the 100s. Will give levemir SQ, iv novolog once and order SSI for now.    Lactic acidosis   Hypokalemia: given iv K in the ED. Repeat in the am. Check mag   Nausea and vomiting Dehydration: gentle IVF  Code Status:  full Family Communication:  Multiple family members at bedside Disposition Plan: home  Time spent:  60 minutes  Erin Obando L [facility] Pager [phone]  If 7PM-7AM, please contact night-coverage [email or web address] Password [facility] 08/16/2012, 10:16 AM

## 2012-08-16 NOTE — Progress Notes (Signed)
Utilization review completed.  

## 2012-08-16 NOTE — ED Notes (Signed)
Per EMS: Pt states she has been feeling weak the majority of the day. Pt reports weakness became worse when she started her peritoneal dialysis treatment tonight. C/O intermittent abdominal pain throughout today. Pt had approx. 1h 35m left on dialysis cycle. Dialysis fistula in L arm. Ax4, NAD at this time.

## 2012-08-16 NOTE — Progress Notes (Signed)
Patient ID: Lori Greer, female   DOB: 07-27-1970, 42 y.o.   MRN: [id number]  She denies pain and her abdomen remains soft and non-tender on exam  I will let her try clear liquids

## 2012-08-16 NOTE — Progress Notes (Signed)
Drained off 100cc of clear, yellow fluid from patient. Sent specimen to lab.

## 2012-08-16 NOTE — Consult Note (Signed)
I have seen and examined the patient and agree with the assessment and plans. Her abdominal exam is benign and non-tender and does not seem consistent with a perforation. None-the-less, I do think she needs 24 hours of observation, bowel rest, and antibiotics.  Will do serial abdominal exams.  Lori Greer A. Magnus Ivan  MD, FACS

## 2012-08-16 NOTE — ED Notes (Signed)
General Surgery at the bedside

## 2012-08-16 NOTE — Consult Note (Signed)
[facility] Renal Consultation Note    Indication for Consultation:  Management of ESRD/hemodialysis; anemia, hypertension/volume and secondary hyperparathyroidism  HPI: Lori Greer is a 42 y.o. female with ESRD secondary to diabetes on dialysis approximately four months presents with abdominal pain that started yesterday prior to taking a laxative and also awoke her from her sleep early this am. She also had low BP (90s she thinks) with lightheadedness and family called EMS and she was brought her for evaluation. In June she had positional issues with PD catheter and also lysis of adhesions. Since then PD catheter has worked well.  During 7/15 Home Training visit she was noted to have cloudy fluid in transfer set. Since she didn't have any PD fluid dwelling, the PD RN filled her will with and she was sent home to dwell for 2 hours. When she returned, the PD fluid was clear, cell count was done and found to be 38 with 75% neutrophils. PD fluid cultures were + for corynebacterium.  Her empiric Vanc and Elita Quick were changed to Ancef 1 gm IP to be given through 8/05 (3 week course). Yesterday she had some vomiting in the afternoon. She took lactulose for constipation with good results and awoke again last night with abdominal pain and vomiting. She denied fever, chills, sweats. She has some pain with fills and sharp pain with drains. She has no sore throat, URI sx, She has pain with urination at times.  Past Medical History  Diagnosis Date  . Bell's palsy 05/12/2008  . DIABETES MELLITUS, TYPE II 03/16/2008  . HYPERTENSION 01/06/2010  . OBESITY 03/16/2008  . Proliferative diabetic retinopathy(362.02) 04/23/2009  . Fibroid, uterine   . ESRD (end stage renal disease)     ESRD on HD  . Anemia   . CAD (coronary artery disease) 01/2012    a. Cardiac cath done for abn nuc 02/23/2012 - 2 vessel CAD s/p DES to mid-LAD, residual disease in the PL OM for medical therapy.   . End-stage renal disease  on peritoneal dialysis 03/12/2012    PD cath placed in Mar 2014 and had tunneled HD cath placed in Jan 2014. Having problems as of Jun '14 with PD cath draining poorly .  PD cath repositioned from LUQ to pelvis by Dr Derrell Lolling on 07/07/12 by lap procedure.      Past Surgical History  Procedure Laterality Date  . Appendectomy  2012  . Uterine fibroid surgery    . Abdominal hysterectomy    . Abcess drainage  2002    Right axilla  . Insertion of dialysis catheter  January 2014    Right IJ   . Capd insertion Left 04/08/2012    Procedure: LAPAROSCOPIC INSERTION CONTINUOUS AMBULATORY PERITONEAL DIALYSIS  (CAPD) CATHETER;  Surgeon: Axel Filler, MD;  Location: [facility] OR;  Service: General;  Laterality: Left;  . Laparoscopic repositioning capd catheter N/A 07/08/2012    Procedure:  DIAGNOSTIC LAPAROSCOPY LAPAROSCOPIC REPOSITIONING CAPD CATHETER;  Surgeon: Axel Filler, MD;  Location: [facility] OR;  Service: General;  Laterality: N/A;  . Laparoscopy N/A 07/14/2012    Procedure: LAPAROSCOPY DIAGNOSTIC WITH LYSIS OF ADHESIONS AND REPOSITIONING OF PERITONEAL DIALYSIS CATHETER;  Surgeon: Axel Filler, MD;  Location: [facility] OR;  Service: General;  Laterality: N/A;  . Av fistula placement Left 07/16/2012    Procedure: ARTERIOVENOUS (AV) FISTULA CREATION BRACHIO-CEPHALIC;  Surgeon: Chuck Hint, MD;  Location: [facility] OR;  Service: Vascular;  Laterality: Left;   Family History  Problem Relation Age of Onset  .  Heart disease Mother     MI at age 75  . Diabetes Father   . Kidney failure Father   . Diabetes Sister     1 sister with diabtes  . Diabetes Brother    Social History:  reports that she quit smoking about 8 years ago. Her smoking use included Cigarettes. She smoked 0.00 packs per day. She has never used smokeless tobacco. She reports that  drinks alcohol. She reports that she does not use illicit drugs. No Known Allergies Prior to Admission medications   Medication Sig Start Date End Date Taking?  Authorizing Provider  aspirin EC 81 MG tablet Take 81 mg by mouth daily.   Yes Historical Provider, MD  bisacodyl (DULCOLAX) 5 MG EC tablet Take 5 mg by mouth daily as needed for constipation.   Yes Historical Provider, MD  calcitRIOL (ROCALTROL) 0.5 MCG capsule Take 1 mcg by mouth daily.   Yes Historical Provider, MD  clopidogrel (PLAVIX) 75 MG tablet Take 75 mg by mouth daily.   Yes Historical Provider, MD  folic acid-vitamin b complex-vitamin c-selenium-zinc (DIALYVITE) 3 MG TABS Take 1 tablet by mouth daily.   Yes Historical Provider, MD  metoprolol succinate (TOPROL-XL) 50 MG 24 hr tablet Take 50 mg by mouth daily. 04/10/12  Yes Lewayne Bunting, MD  polyethylene glycol powder (GLYCOLAX/MIRALAX) powder Take 17 g by mouth daily.   Yes Historical Provider, MD  pravastatin (PRAVACHOL) 40 MG tablet Take 40 mg by mouth every evening.   Yes Historical Provider, MD  sevelamer (RENVELA) 800 MG tablet Take 1,600 mg by mouth 3 (three) times daily with meals. Takes 1600mg  with each meal, and 800mg  with each snack.   Yes Historical Provider, MD   Current Facility-Administered Medications  Medication Dose Route Frequency Provider Last Rate Last Dose  . 0.9 %  sodium chloride infusion   Intravenous STAT Christiane Ha, MD 10 mL/hr at 08/16/12 1148 10 mL/hr at 08/16/12 1148  . fentaNYL (SUBLIMAZE) injection 25 mcg  25 mcg Intravenous Q2H PRN Christiane Ha, MD      . insulin aspart (novoLOG) injection 0-9 Units  0-9 Units Subcutaneous Q4H Christiane Ha, MD      . ondansetron [facility]) tablet 4 mg  4 mg Oral Q6H PRN Christiane Ha, MD       Or  . ondansetron [facility]) injection 4 mg  4 mg Intravenous Q6H PRN Christiane Ha, MD      . sodium chloride 0.9 % injection 3 mL  3 mL Intravenous Q12H Christiane Ha, MD       Labs: Basic Metabolic Panel:  Recent Labs Lab 08/16/12 0400 08/16/12 0404  NA 133* 134*  K 2.5* 2.4*  CL 88* 97  CO2 23  --   GLUCOSE 520* 526*  BUN 45* 43*   CREATININE 9.49* 9.10*  CALCIUM 11.1*  --   PHOS 7.5*  --    Liver Function Tests:  Recent Labs Lab 08/16/12 0400  AST 30  ALT <5  ALKPHOS 122*  BILITOT 0.3  PROT 9.6*  ALBUMIN 3.6    Recent Labs Lab 08/16/12 0400  LIPASE 20   CBC:  Recent Labs Lab 08/16/12 0400 08/16/12 0404  WBC 10.9*  --   HGB 16.8* 18.4*  HCT 47.1* 54.0*  MCV 86.7  --   PLT 359  --   Ct Abdomen Pelvis Wo Contrast  08/16/2012   *RADIOLOGY REPORT*  Clinical Data: Diabetic hypertensive patient with end-stage renal  disease on peritoneal dialysis.  Feeling weak which became worse when she started peritoneal dialysis.  Intermittent abdominal pain. Question perforation?  Prior hysterectomy and appendectomy.  CT ABDOMEN AND PELVIS WITHOUT CONTRAST   IMPRESSION: Peritoneal dialysis catheter enters from the left lower abdomen and the tip curled within the pelvis.  Fluid is noted throughout the abdomen pelvis.  Additionally, there is free intraperitoneal air most notable in the upper abdomen.  This amount of free air could conceivably be related to air introduced at the time of the peritoneal dialysis.  Bowel perforation as cause of free intraperitoneal air is not excluded. There is however, no discrete bowel defect or extraluminal contrast.  Surgical consultation and / or close follow-up imaging recommended depending on clinical suspicion.  Please see above discussion.  Critical Value/emergent results were called by telephone at the time of interpretation on 08/16/2012 at 8:00 a.m. to Dr. Dan Humphreys., who verbally acknowledged these results.   Original Report Authenticated By: Lacy Duverney, M.D.   Dg Chest Portable 1 View  08/16/2012   *RADIOLOGY REPORT*  Clinical Data: Hypotension  PORTABLE CHEST - 1 VIEW  Comparison: Prior radiograph from 07/18/2012  Findings: A left-sided dual-lumen hemodialysis catheter is in place with tip overlying the mid SVC.    IMPRESSION:  1.  Curvilinear lucency underneath the right  hemidiaphragm, concerning for free intraperitoneal air.  This finding is of uncertain clinical etiology, and may be related to the presence of a peritoneal dialysis catheter.  Further evaluation with cross sectional imaging of the abdomen and pelvis could be performed as clinically indicated. 2.  Stable cardiomegaly without edema. 3.  No airspace consolidation to suggest acute infectious pneumonitis.  Critical Value/emergent results were called by telephone at the time of interpretation on 08/16/12 at 0520 am to Sunnie Nielsen by Dr. Hoy Morn, who verbally acknowledged these results.   Original Report Authenticated By: Rise Mu, M.D.   ROS: As per HPI otherwise negative.  Physical Exam: Filed Vitals:   08/16/12 0715 08/16/12 0920 08/16/12 0921 08/16/12 1100  BP: 143/76 128/84 128/84   Pulse: 80 88 84   Temp:      TempSrc:      Resp: 15  16   Height:    5\' 1"  (1.549 m)  Weight:    75.5 kg (166 lb 7.2 oz)  SpO2: 100% 100% 100%      General: Well developed, well nourished, in no acute distress. Head: Normocephalic, atraumatic, sclera non-icteric, mucus membranes are moist Neck: Supple. JVD not elevated. Left I-J cath - dressing dirty - no overt drainage at cath exit site Lungs: Clear bilaterally to auscultation without wheezes, rales, or rhonchi. Breathing is unlabored. Heart: RRR with S1 S2. No murmurs, rubs, or gallops appreciated. Abdomen: Soft, non-tender, non-distended with normoactive bowel sounds. No rebound/guarding. No obvious abdominal masses. M-S:  Strength and tone appear normal for age. Lower extremities: without edema or ischemic changes, no open wounds  Neuro: Alert and oriented X 3. Moves all extremities spontaneously. Psych:  Responds to questions appropriately with a normal affect. Dialysis Access: left I-J (placed 6/27 Dr. Juel Burrow), left maturing upper AVF and left abdominal PD cath - scant ? drainage  Dialysis Orders: [facility] Training; CCPD 6 exchanges 6 fills; 2  L last fill 1L dwell 1:45 daytime exchange fill 2 l Epo 19K SQ 2x weekly; Venofer 100/week plus give 300 venofer 7/25; on Ancef 1 gm IP q daytime dwell through 8/5 = 3 weeks for + corynebacterium PD fluid cultures  7/16. Prior cell count 7/15 was 38 with 75% N, but pt had suboptimal specimen  Recent labs;  hgb 9.3 7/3, ferritin 406 and 22% sat 7/3; iPTH 789 7/3  Assessment/Plan: 1. Abdominal pain - hx corynebacterium perotinitis - on ancef through 7/5 = 3 week course; no abdominal pain now, lipase normal, afebrile, WBC high end of normal PD fluid clear, cell count 73 with 52% neutrophils ? Free air, but exam seems benign at present; given urinary sx, would check UA and culture; also BC x 2 given indwelling perm cath; has been on ancef IP daily an an outpt; on Zosyn here no evidence of peritonitis at this time, she is on abx for recent suspected peritonitis as above 2. Presyncope/hypotension- hemoconcentrated due to vol depletion, got fluid in ED, may need more; does not appear septic, but has HD cath L chest, will get blood cx's 3. ESRD with hypokalemia - IV KCL given; change to po when off NPO status. -  CCPD with day time dwell; arrange for perm cath removal by IR 4. Hypertension/volume  - s/p IVF in ED - BP corrected volume control and metoprolol ( Currently on hold); Continue gentle rehydration 50/hr while NPO 5. Anemia  - Epo 19K 2 x weekly, Hgb significantly high compared with last values from dialysis center possibly some hemoconcentration- no ESA or Fe for now; repeat CBC in am 6. Metabolic bone disease -  Calcitriol 1 mcg per day; continue when eating; Ca 11.1 - hold rocaltrol -  7. Nutrition - low P diet when off NPO status 8. DM - marked hyperglycemia - per primary  Sheffield Slider, PA-C [facility] Beeper [phone] 08/16/2012, 12:23 PM   Patient seen and examined.  I agree with plan as above with additions as indicated. [name]  MD Pager [phone]    Cell  [phone] 08/16/2012, 1:36 PM

## 2012-08-16 NOTE — ED Provider Notes (Signed)
At 0700 hours, I discussed the patient's care with Dr. Sunnie Nielsen. Patient has a history of diabetes, hypertension, end-stage renal disease on peritoneal dialysis, and coronary artery disease.  She presented to the emergency department with generalized weakness for one day which worsens during dialysis yesterday and complained of intermittent abdominal pain. Initial workup showed free air under the diaphragm on chest x-ray; therefore, CT abdomen/pelvis is pending to assess for any viscous perforation.  8:34 AM I spoke with the radiologist regarding the CT findings. Gen. surgery has been consulted for further recommendations.  Patient is currently hemodynamically stable with systolic pressure of 143, pulse 80, respirations 16, and sats 100%.  Results for orders placed during the hospital encounter of 08/16/12  CBC      Result Value Range   WBC 10.9 (*) 4.0 - 10.5 K/uL   RBC 5.43 (*) 3.87 - 5.11 MIL/uL   Hemoglobin 16.8 (*) 12.0 - 15.0 g/dL   HCT 16.1 (*) 09.6 - 04.5 %   MCV 86.7  78.0 - 100.0 fL   MCH 30.9  26.0 - 34.0 pg   MCHC 35.7  30.0 - 36.0 g/dL   RDW 40.9  81.1 - 91.4 %   Platelets 359  150 - 400 K/uL  COMPREHENSIVE METABOLIC PANEL      Result Value Range   Sodium 133 (*) 135 - 145 mEq/L   Potassium 2.5 (*) 3.5 - 5.1 mEq/L   Chloride 88 (*) 96 - 112 mEq/L   CO2 23  19 - 32 mEq/L   Glucose, Bld 520 (*) 70 - 99 mg/dL   BUN 45 (*) 6 - 23 mg/dL   Creatinine, Ser 7.82 (*) 0.50 - 1.10 mg/dL   Calcium 95.6 (*) 8.4 - 10.5 mg/dL   Total Protein 9.6 (*) 6.0 - 8.3 g/dL   Albumin 3.6  3.5 - 5.2 g/dL   AST 30  0 - 37 U/L   ALT <5  0 - 35 U/L   Alkaline Phosphatase 122 (*) 39 - 117 U/L   Total Bilirubin 0.3  0.3 - 1.2 mg/dL   GFR calc non Af Amer 4 (*) >90 mL/min   GFR calc Af Amer 5 (*) >90 mL/min  BODY FLUID CELL COUNT WITH DIFFERENTIAL      Result Value Range   Fluid Type-FCT PERITONEAL DIALYSATE     Color, Fluid COLORLESS (*) YELLOW   Appearance, Fluid CLEAR  CLEAR   WBC, Fluid  73  0 - 1000 cu mm   Neutrophil Count, Fluid 52 (*) 0 - 25 %   Lymphs, Fluid 5     Monocyte-Macrophage-Serous Fluid 43 (*) 50 - 90 %   Eos, Fluid 0    POCT I-STAT, CHEM 8      Result Value Range   Sodium 134 (*) 135 - 145 mEq/L   Potassium 2.4 (*) 3.5 - 5.1 mEq/L   Chloride 97  96 - 112 mEq/L   BUN 43 (*) 6 - 23 mg/dL   Creatinine, Ser 2.13 (*) 0.50 - 1.10 mg/dL   Glucose, Bld 086 (*) 70 - 99 mg/dL   Calcium, Ion 5.78  4.69 - 1.23 mmol/L   TCO2 23  0 - 100 mmol/L   Hemoglobin 18.4 (*) 12.0 - 15.0 g/dL   HCT 62.9 (*) 52.8 - 41.3 %   Comment NOTIFIED PHYSICIAN    CG4 I-STAT (LACTIC ACID)      Result Value Range   Lactic Acid, Venous 3.60 (*) 0.5 - 2.2 mmol/L  POCT I-STAT 3, BLOOD GAS (G3P V)      Result Value Range   pH, Ven 7.245 (*) 7.250 - 7.300   pCO2, Ven 57.2 (*) 45.0 - 50.0 mmHg   pO2, Ven 30.0  30.0 - 45.0 mmHg   Bicarbonate 24.8 (*) 20.0 - 24.0 mEq/L   TCO2 27  0 - 100 mmol/L   O2 Saturation 46.0     Acid-base deficit 4.0 (*) 0.0 - 2.0 mmol/L   Sample type VENOUS     Ct Abdomen Pelvis Wo Contrast  08/16/2012   *RADIOLOGY REPORT*  Clinical Data: Diabetic hypertensive patient with end-stage renal disease on peritoneal dialysis.  Feeling weak which became worse when she started peritoneal dialysis.  Intermittent abdominal pain. Question perforation?  Prior hysterectomy and appendectomy.  CT ABDOMEN AND PELVIS WITHOUT CONTRAST  Technique:  Multidetector CT imaging of the abdomen and pelvis was performed following the standard protocol without intravenous contrast.  Comparison: 07/18/2012 plain film examination.  12/18/2009 CT.  Findings: Peritoneal dialysis catheter enters from the left lower abdomen and the tip curled within the pelvis.  Fluid is noted throughout the abdomen pelvis.  Additionally, there is free intraperitoneal air most notable in the upper abdomen.  This amount of free air could conceivably be related to air introduced at the time of the peritoneal dialysis.   Bowel perforation as cause of free intraperitoneal air is not excluded. There is however, no discrete bowel defect or extraluminal contrast.  Surgical consultation and / or close follow-up imaging recommended.  Free air is more notable adjacent to the stomach.  Thickening of the third portion of the duodenum may represent under distension or mild inflammation.  Narrowing of portions of the colon (such as at the level of the hepatic flexure) may represent result of peristalsis limiting excluding underlying circumferential mass.  Clinical history states the patient is post appendectomy however, radiopaque material right abdomen has an appearance suggestive of the appendix.  Evaluation of solid abdominal viscera is limited by lack of IV contrast.  Taking this limitation into account no worrisome hepatic, splenic, pancreatic, adrenal or renal lesion.  No calcified gallstone.  Limited for evaluating for cholecystitis given the surrounding fluid.  Ultrasound can be obtained for further delineation if this is of concern.  No bony destructive lesion or CT evidence of disc space infection.  Cardiomegaly.  Suggestion of prominent coronary artery calcifications.  No abdominal aortic aneurysm.  Partial calcification left common iliac artery.  No worrisome adenopathy.  Lung bases clear.  IMPRESSION: Peritoneal dialysis catheter enters from the left lower abdomen and the tip curled within the pelvis.  Fluid is noted throughout the abdomen pelvis.  Additionally, there is free intraperitoneal air most notable in the upper abdomen.  This amount of free air could conceivably be related to air introduced at the time of the peritoneal dialysis.  Bowel perforation as cause of free intraperitoneal air is not excluded. There is however, no discrete bowel defect or extraluminal contrast.  Surgical consultation and / or close follow-up imaging recommended depending on clinical suspicion.  Please see above discussion.  Critical  Value/emergent results were called by telephone at the time of interpretation on 08/16/2012 at 8:00 a.m. to Dr. Dan Humphreys., who verbally acknowledged these results.   Original Report Authenticated By: Lacy Duverney, M.D.   Dg Chest Portable 1 View  08/16/2012   *RADIOLOGY REPORT*  Clinical Data: Hypotension  PORTABLE CHEST - 1 VIEW  Comparison: Prior radiograph from 07/18/2012  Findings: A left-sided  dual-lumen hemodialysis catheter is in place with tip overlying the mid SVC.  Cardiomegaly is not significantly changed as compared to prior exam.  Curvilinear lucency is seen overlying the dome of the liver, underneath the right hemidiaphragm, concerning for free intraperitoneal air.  The lungs are clear without airspace consolidation, pleural effusion, or pulmonary edema.  There is no pneumothorax on this semi erect portable exam.  IMPRESSION:  1.  Curvilinear lucency underneath the right hemidiaphragm, concerning for free intraperitoneal air.  This finding is of uncertain clinical etiology, and may be related to the presence of a peritoneal dialysis catheter.  Further evaluation with cross sectional imaging of the abdomen and pelvis could be performed as clinically indicated. 2.  Stable cardiomegaly without edema. 3.  No airspace consolidation to suggest acute infectious pneumonitis.  Critical Value/emergent results were called by telephone at the time of interpretation on 08/16/12 at 0520 am to Sunnie Nielsen by Dr. Hoy Morn, who verbally acknowledged these results.   Original Report Authenticated By: Rise Mu, M.D.   Dg Abd 2 Views  07/18/2012   *RADIOLOGY REPORT*  Clinical Data: Abdominal swelling.  Peritoneal dialysis.  ABDOMEN - 2 VIEW  Comparison: 07/12/2012  Findings: Peritoneal dialysis catheter projects over the midline of the pelvis.  Nonobstructive bowel gas pattern.  No free air.  No organomegaly or suspicious calcification.  Lung bases are clear. No acute bony abnormality.  IMPRESSION: No  acute findings.   Original Report Authenticated By: Charlett Nose, M.D.      [facility] to follow, plan to admit to Gen Med  Ashby Dawes, MD 08/17/12 [phone]

## 2012-08-16 NOTE — ED Notes (Signed)
Admitting MD at the bedside.  

## 2012-08-16 NOTE — ED Notes (Signed)
Gave report to receiving rn

## 2012-08-16 NOTE — ED Notes (Signed)
CT notified that patient has successfully drank all of the contrast media.

## 2012-08-16 NOTE — Clinical Documentation Improvement (Signed)
THIS DOCUMENT IS NOT A PERMANENT PART OF THE MEDICAL RECORD  Please update your documentation with the medical record to reflect your response to this query. If you need help knowing how to do this please call [phone].                                                                                         08/16/12   Dear Weston Settle, PA-C:/Associates,  In a better effort to capture your patient's severity of illness, reflect appropriate length of stay and utilization of resources, a review of the patient medical record has revealed the following indicators.    Based on your clinical judgment, please clarify and document in a progress note and/or discharge summary the clinical condition associated with the following supporting information:  In responding to this query please exercise your independent judgment.  The fact that a  query is asked, does not imply that any particular answer is desired or expected.  Possible Clinical Conditions?    The Peritonitis diagnosis is a complication  due to the PD Catheter  The Peritonitis diagnosis is unrelated to the PD Catherter  Other Condition_______________  Cannot Clinically Determine______   Supporting Information:( As per notes) Free air seen on imaging could be related to peritoneal dialysis.  Abdominal pain - hx corynebacterium perotinitis - on ancef through 7/5 = 3 week course; no abdominal pain now, lipase normal, afebrile, WBC high end of normal PD fluid clear, cell count 73 with 52% neutrophils ? Free air, but exam seems benign at present; given urinary sx, would check UA and culture; also BC x 2 given indwelling perm cath; has been on ancef IP daily an an outpt; on Zosyn here no evidence of peritonitis at this time, she is on abx for recent suspected peritonitis as above  Treatment: Zosyn  You may use possible, probable, or suspect with inpatient documentation. possible, probable, suspected diagnoses MUST be documented at the  time of discharge  Reviewed: additional documentation in the medical record  Thank You,  Nevin Bloodgood RN,BSN, CCDS Clinical Documentation Specialist: [phone] Health Information Management [place]

## 2012-08-17 ENCOUNTER — Inpatient Hospital Stay (HOSPITAL_COMMUNITY): Payer: Medicare Other

## 2012-08-17 DIAGNOSIS — I2510 Atherosclerotic heart disease of native coronary artery without angina pectoris: Secondary | ICD-10-CM

## 2012-08-17 LAB — URINE MICROSCOPIC-ADD ON

## 2012-08-17 LAB — CBC WITH DIFFERENTIAL/PLATELET
Basophils Absolute: 0 10*3/uL (ref 0.0–0.1)
Basophils Relative: 0 % (ref 0–1)
Eosinophils Absolute: 0.1 10*3/uL (ref 0.0–0.7)
Eosinophils Relative: 1 % (ref 0–5)
HCT: 37.1 % (ref 36.0–46.0)
Hemoglobin: 13.6 g/dL (ref 12.0–15.0)
Lymphocytes Relative: 15 % (ref 12–46)
Lymphs Abs: 1.4 10*3/uL (ref 0.7–4.0)
MCH: 31.4 pg (ref 26.0–34.0)
MCHC: 36.7 g/dL — ABNORMAL HIGH (ref 30.0–36.0)
MCV: 85.7 fL (ref 78.0–100.0)
Monocytes Absolute: 0.6 10*3/uL (ref 0.1–1.0)
Monocytes Relative: 7 % (ref 3–12)
Neutro Abs: 7.3 10*3/uL (ref 1.7–7.7)
Neutrophils Relative %: 78 % — ABNORMAL HIGH (ref 43–77)
Platelets: 249 10*3/uL (ref 150–400)
RBC: 4.33 MIL/uL (ref 3.87–5.11)
RDW: 14 % (ref 11.5–15.5)
WBC: 9.4 10*3/uL (ref 4.0–10.5)

## 2012-08-17 LAB — URINALYSIS, ROUTINE W REFLEX MICROSCOPIC
Glucose, UA: NEGATIVE mg/dL
Ketones, ur: 15 mg/dL — AB
Nitrite: NEGATIVE
Protein, ur: >300 mg/dL — AB
Specific Gravity, Urine: 1.03 (ref 1.005–1.030)
Urobilinogen, UA: 0.2 mg/dL (ref 0.0–1.0)
pH: 5 (ref 5.0–8.0)

## 2012-08-17 LAB — TROPONIN I
Troponin I: <0.3 ng/mL (ref <0.30)
Troponin I: <0.3 ng/mL (ref <0.30)

## 2012-08-17 LAB — RENAL FUNCTION PANEL
Albumin: 2.6 g/dL — ABNORMAL LOW (ref 3.5–5.2)
BUN: 41 mg/dL — ABNORMAL HIGH (ref 6–23)
CO2: 23 mEq/L (ref 19–32)
Calcium: 9 mg/dL (ref 8.4–10.5)
Chloride: 90 mEq/L — ABNORMAL LOW (ref 96–112)
Creatinine, Ser: 8.86 mg/dL — ABNORMAL HIGH (ref 0.50–1.10)
GFR calc Af Amer: 6 mL/min — ABNORMAL LOW (ref >90)
GFR calc non Af Amer: 5 mL/min — ABNORMAL LOW (ref >90)
Glucose, Bld: 189 mg/dL — ABNORMAL HIGH (ref 70–99)
Phosphorus: 7.2 mg/dL — ABNORMAL HIGH (ref 2.3–4.6)
Potassium: 2.5 mEq/L — CL (ref 3.5–5.1)
Sodium: 130 mEq/L — ABNORMAL LOW (ref 135–145)

## 2012-08-17 LAB — GLUCOSE, CAPILLARY
Glucose-Capillary: 104 mg/dL — ABNORMAL HIGH (ref 70–99)
Glucose-Capillary: 149 mg/dL — ABNORMAL HIGH (ref 70–99)
Glucose-Capillary: 163 mg/dL — ABNORMAL HIGH (ref 70–99)
Glucose-Capillary: 168 mg/dL — ABNORMAL HIGH (ref 70–99)
Glucose-Capillary: 178 mg/dL — ABNORMAL HIGH (ref 70–99)
Glucose-Capillary: 88 mg/dL (ref 70–99)

## 2012-08-17 MED ORDER — HEPARIN SODIUM (PORCINE) 1000 UNIT/ML IJ SOLN
500.0000 [IU] | INTRAMUSCULAR | Status: DC
  Filled 2012-08-17 (×2): qty 0.5

## 2012-08-17 MED ORDER — METOPROLOL SUCCINATE ER 25 MG PO TB24
25.0000 mg | ORAL_TABLET | Freq: Every day | ORAL | Status: DC
  Administered 2012-08-17 – 2012-08-21 (×5): 25 mg via ORAL
  Filled 2012-08-17 (×5): qty 1

## 2012-08-17 MED ORDER — SODIUM CHLORIDE 0.9 % IV SOLN: INTRAVENOUS | Status: DC

## 2012-08-17 MED ORDER — DEXTROSE 5 % IV SOLN
1.5000 g | INTRAVENOUS | Status: DC
  Filled 2012-08-17: qty 1.5

## 2012-08-17 MED ORDER — CLOPIDOGREL BISULFATE 75 MG PO TABS
75.0000 mg | ORAL_TABLET | Freq: Every day | ORAL | Status: DC
  Administered 2012-08-18: 75 mg via ORAL
  Filled 2012-08-17 (×5): qty 1

## 2012-08-17 MED ORDER — SORBITOL 70 % SOLN
15.0000 mL | Freq: Every day | Status: DC | PRN
  Administered 2012-08-17 – 2012-08-18 (×2): 15 mL via ORAL
  Filled 2012-08-17 (×2): qty 30

## 2012-08-17 MED ORDER — DEXTROSE 5 % IV SOLN
1.5000 g | INTRAVENOUS | Status: AC
  Filled 2012-08-17: qty 1.5

## 2012-08-17 MED ORDER — POTASSIUM CHLORIDE 20 MEQ/15ML (10%) PO LIQD
40.0000 meq | Freq: Once | ORAL | Status: AC
  Administered 2012-08-17: 40 meq via ORAL
  Filled 2012-08-17: qty 30

## 2012-08-17 MED ORDER — SODIUM CHLORIDE 0.9 % IV SOLN
INTRAVENOUS | Status: AC
  Administered 2012-08-17: 17:00:00 via INTRAVENOUS

## 2012-08-17 MED ORDER — HYDROMORPHONE HCL PF 1 MG/ML IJ SOLN
0.5000 mg | INTRAMUSCULAR | Status: DC | PRN
  Administered 2012-08-17 – 2012-08-21 (×15): 0.5 mg via INTRAVENOUS
  Filled 2012-08-17 (×13): qty 1

## 2012-08-17 MED ORDER — ASPIRIN 81 MG PO CHEW
81.0000 mg | CHEWABLE_TABLET | Freq: Every day | ORAL | Status: DC
  Administered 2012-08-17 – 2012-08-21 (×5): 81 mg via ORAL
  Filled 2012-08-17 (×5): qty 1

## 2012-08-17 MED ORDER — SODIUM CHLORIDE 0.9 % IV BOLUS (SEPSIS)
750.0000 mL | Freq: Once | INTRAVENOUS | Status: AC
  Administered 2012-08-17: 750 mL via INTRAVENOUS

## 2012-08-17 MED ORDER — SODIUM CHLORIDE 0.9 % IV SOLN
2000.0000 mg | Freq: Once | INTRAVENOUS | Status: AC
  Administered 2012-08-17: 2000 mg via INTRAVENOUS
  Filled 2012-08-17: qty 2000

## 2012-08-17 NOTE — Progress Notes (Signed)
Patient started the cycl er around 8pm 7/25.  Did not get a dry weight d/t patient bypassed the last bag alarm.

## 2012-08-17 NOTE — Progress Notes (Signed)
[facility] PROGRESS NOTE  Lori Greer ZOX:[id number] DOB: [birth year] DOA: 08/16/2012 PCP: Rogelia Boga, MD  Assessment/Plan: Abdominal pain -CT abdomen without any bowel defects or extraluminal contrast -May be related to hyperosmolar nonketotic state secondary to the patient's profound hyperglycemia--520 on admission -Patient states 50% better today -Patient requests advancing diet--> advanced to full liquids -PD fluid cell count does not suggest peritonitis--> follow culture -Lipase 20 -Continue empiric Zosyn -PD catheter placed March 2014 ? Corynebacterium peritonitis? -Deferred to nephrology service -PD fluid Cell count on July 15--->30 Diabetes mellitus type 2 -Hemoglobin A1c 5.7 -Acetone negative -Patient states that she drank "regular" Coca[employer] prior to ED yesterday -Continue NovoLog sliding scale for now -Patient only intermittently uses insulin at home--> only used it once in the past week -Will not likely need insulin at the time of discharge Gap metabolic acidosis -Partly a lactic acidosis--3.60 on 08/16/2012 -Recheck lactic acid in am CKD stage V, on peritoneal dialysis -Appreciate renal followup Hypokalemia -Replete judiciously -Check magnesium Hypertension -Restart metoprolol succinate Coronary artery disease -DES to mid LAD 02/23/2012 -Restart aspirin and Plavix  Family Communication:   Pt at beside Disposition Plan:   Home when medically stable    Antibiotics:  Zosyn 08/16/2012>>>       Procedures/Studies: Ct Abdomen Pelvis Wo Contrast  08/16/2012   *RADIOLOGY REPORT*  Clinical Data: Diabetic hypertensive patient with end-stage renal disease on peritoneal dialysis.  Feeling weak which became worse when she started peritoneal dialysis.  Intermittent abdominal pain. Question perforation?  Prior hysterectomy and appendectomy.  CT ABDOMEN AND PELVIS WITHOUT CONTRAST  Technique:  Multidetector CT imaging of the abdomen and pelvis  was performed following the standard protocol without intravenous contrast.  Comparison: 07/18/2012 plain film examination.  12/18/2009 CT.  Findings: Peritoneal dialysis catheter enters from the left lower abdomen and the tip curled within the pelvis.  Fluid is noted throughout the abdomen pelvis.  Additionally, there is free intraperitoneal air most notable in the upper abdomen.  This amount of free air could conceivably be related to air introduced at the time of the peritoneal dialysis.  Bowel perforation as cause of free intraperitoneal air is not excluded. There is however, no discrete bowel defect or extraluminal contrast.  Surgical consultation and / or close follow-up imaging recommended.  Free air is more notable adjacent to the stomach.  Thickening of the third portion of the duodenum may represent under distension or mild inflammation.  Narrowing of portions of the colon (such as at the level of the hepatic flexure) may represent result of peristalsis limiting excluding underlying circumferential mass.  Clinical history states the patient is post appendectomy however, radiopaque material right abdomen has an appearance suggestive of the appendix.  Evaluation of solid abdominal viscera is limited by lack of IV contrast.  Taking this limitation into account no worrisome hepatic, splenic, pancreatic, adrenal or renal lesion.  No calcified gallstone.  Limited for evaluating for cholecystitis given the surrounding fluid.  Ultrasound can be obtained for further delineation if this is of concern.  No bony destructive lesion or CT evidence of disc space infection.  Cardiomegaly.  Suggestion of prominent coronary artery calcifications.  No abdominal aortic aneurysm.  Partial calcification left common iliac artery.  No worrisome adenopathy.  Lung bases clear.  IMPRESSION: Peritoneal dialysis catheter enters from the left lower abdomen and the tip curled within the pelvis.  Fluid is noted throughout the abdomen  pelvis.  Additionally, there is free intraperitoneal air most notable in the upper abdomen.  This amount of free air could conceivably be related to air introduced at the time of the peritoneal dialysis.  Bowel perforation as cause of free intraperitoneal air is not excluded. There is however, no discrete bowel defect or extraluminal contrast.  Surgical consultation and / or close follow-up imaging recommended depending on clinical suspicion.  Please see above discussion.  Critical Value/emergent results were called by telephone at the time of interpretation on 08/16/2012 at 8:00 a.m. to Dr. Dan Humphreys., who verbally acknowledged these results.   Original Report Authenticated By: Lacy Duverney, M.D.   Ir Removal Tun Cv Cath W/o Fl  08/16/2012   *RADIOLOGY REPORT*  Clinical Data/Indication: Peritoneal dialysis  TUNNEL CATHETER REMOVAL  Procedure: The procedure, risks, benefits, and alternatives were explained to the patient. Questions regarding the procedure were encouraged and answered. The patient understands and consents to the procedure.  The left chest was prepped with betadine in a sterile fashion, and a sterile drape was applied covering the operative field. A sterile gown and sterile gloves were used for the procedure.  1% lidocaine was utilized for local anesthesia. Utilizing blunt dissection, the cuff of the catheter was freed from the underlying subcutaneous tissue. The catheter was removed in its entirety. Hemostasis was achieved with direct pressure.  Complications: None.  IMPRESSION: Successful tunneled dialysis catheter removal.   Original Report Authenticated By: Jolaine Click, M.D.   Dg Chest Portable 1 View  08/16/2012   *RADIOLOGY REPORT*  Clinical Data: Hypotension  PORTABLE CHEST - 1 VIEW  Comparison: Prior radiograph from 07/18/2012  Findings: A left-sided dual-lumen hemodialysis catheter is in place with tip overlying the mid SVC.  Cardiomegaly is not significantly changed as compared to prior  exam.  Curvilinear lucency is seen overlying the dome of the liver, underneath the right hemidiaphragm, concerning for free intraperitoneal air.  The lungs are clear without airspace consolidation, pleural effusion, or pulmonary edema.  There is no pneumothorax on this semi erect portable exam.  IMPRESSION:  1.  Curvilinear lucency underneath the right hemidiaphragm, concerning for free intraperitoneal air.  This finding is of uncertain clinical etiology, and may be related to the presence of a peritoneal dialysis catheter.  Further evaluation with cross sectional imaging of the abdomen and pelvis could be performed as clinically indicated. 2.  Stable cardiomegaly without edema. 3.  No airspace consolidation to suggest acute infectious pneumonitis.  Critical Value/emergent results were called by telephone at the time of interpretation on 08/16/12 at 0520 am to Sunnie Nielsen by Dr. Hoy Morn, who verbally acknowledged these results.   Original Report Authenticated By: Rise Mu, M.D.         Subjective:  this is his abdominal pain is 50% better. Denies any fevers, chills, chest discomfort, shortness of breath, diarrhea, dysuria, dizziness, syncope. She ate clear liquids this morning without vomiting.   Objective: Filed Vitals:   08/16/12 1720 08/16/12 1957 08/17/12 0408 08/17/12 0912  BP: 129/84 158/69 147/75 139/77  Pulse: 88 87 78 88  Temp: 98.8 F (37.1 C) 99.1 F (37.3 C) 98.5 F (36.9 C) 98.5 F (36.9 C)  TempSrc: Oral Oral Oral   Resp: 20 20 18 18   Height:      Weight:      SpO2: 100% 100% 100% 99%    Intake/Output Summary (Last 24 hours) at 08/17/12 1150 Last data filed at 08/17/12 0912  Gross per 24 hour  Intake    250 ml  Output      0 ml  Net  250 ml   Weight change:  Exam:   General:  Pt is alert, follows commands appropriately, not in acute distress  HEENT: No icterus, No thrush,  [place]/AT  Cardiovascular: RRR, S1/S2, no rubs, no  gallops  Respiratory: CTA bilaterally, no wheezing, no crackles, no rhonchi  Abdomen: Soft/+BS, mild diffuse tenderness without peritoneal signs, non distended, no guarding  Extremities: No edema, No lymphangitis, No petechiae, No rashes, no synovitis  Data Reviewed: Basic Metabolic Panel:  Recent Labs Lab 08/16/12 0400 08/16/12 0404 08/17/12 0748  NA 133* 134* 130*  K 2.5* 2.4* 2.5*  CL 88* 97 90*  CO2 23  --  23  GLUCOSE 520* 526* 189*  BUN 45* 43* 41*  CREATININE 9.49* 9.10* 8.86*  CALCIUM 11.1*  --  9.0  MG 2.3  --   --   PHOS 7.5*  --  7.2*   Liver Function Tests:  Recent Labs Lab 08/16/12 0400 08/17/12 0748  AST 30  --   ALT <5  --   ALKPHOS 122*  --   BILITOT 0.3  --   PROT 9.6*  --   ALBUMIN 3.6 2.6*    Recent Labs Lab 08/16/12 0400  LIPASE 20   No results found for this basename: AMMONIA,  in the last 168 hours CBC:  Recent Labs Lab 08/16/12 0400 08/16/12 0404 08/17/12 0525  WBC 10.9*  --  9.4  NEUTROABS  --   --  7.3  HGB 16.8* 18.4* 13.6  HCT 47.1* 54.0* 37.1  MCV 86.7  --  85.7  PLT 359  --  249   Cardiac Enzymes: No results found for this basename: CKTOTAL, CKMB, CKMBINDEX, TROPONINI,  in the last 168 hours BNP: No components found with this basename: POCBNP,  CBG:  Recent Labs Lab 08/16/12 1630 08/16/12 2004 08/17/12 0001 08/17/12 0404 08/17/12 0734  GLUCAP 127* 149* 163* 178* 168*    Recent Results (from the past 240 hour(s))  BODY FLUID CULTURE     Status: None   Collection Time    08/16/12  4:26 AM      Result Value Range Status   Specimen Description PERITONEAL DIALYSATE   Final   Special Requests NONE   Final   Gram Stain     Final   Value: FEW WBC PRESENT, PREDOMINANTLY PMN     NO ORGANISMS SEEN   Culture PENDING   Incomplete   Report Status PENDING   Incomplete     Scheduled Meds: . sodium chloride   Intravenous STAT  . insulin aspart  0-9 Units Subcutaneous Q4H  . piperacillin-tazobactam (ZOSYN)  IV   2.25 g Intravenous Q12H  . potassium chloride  40 mEq Oral Once  . sodium chloride  3 mL Intravenous Q12H   Continuous Infusions: . dialysis solution 1.5% low-MG/low-CA    . heparin       Lori Ternes, DO  [facility] Pager [phone]  If 7PM-7AM, please contact night-coverage [email or web address] Password [facility] 08/17/2012, 11:50 AM   LOS: 1 day

## 2012-08-17 NOTE — Progress Notes (Signed)
[facility]  Daily Progress Note  Assessment/Planning: S/p L BC AVF   From Dr. Adele Dan recent note, pt's L BC AVF not mature yet  Per Dr. Arta Silence, pt will need [facility] placement  This will be scheduled for Monday  Subjective    Admitted for abd pain, possible infection related to PD cath  Objective Filed Vitals:   08/17/12 0912 08/17/12 1322 08/17/12 1713 08/17/12 1952  BP: 139/77 134/63 130/73 132/67  Pulse: 88 94 92 89  Temp: 98.5 F (36.9 C) 99 F (37.2 C) 99.8 F (37.7 C) 99.3 F (37.4 C)  TempSrc:    Oral  Resp: 18 18 18 18   Height:      Weight:    164 lb 7.4 oz (74.6 kg)  SpO2: 99% 100% 100% 100%    Intake/Output Summary (Last 24 hours) at 08/17/12 2043 Last data filed at 08/17/12 1836  Gross per 24 hour  Intake    990 ml  Output      5 ml  Net    985 ml    VASC  L BC AVF w/ palpable thrill and bruit  Laboratory CBC    Component Value Date/Time   WBC 9.4 08/17/2012 0525   HGB 13.6 08/17/2012 0525   HCT 37.1 08/17/2012 0525   PLT 249 08/17/2012 0525    BMET    Component Value Date/Time   NA 130* 08/17/2012 0748   K 2.5* 08/17/2012 0748   CL 90* 08/17/2012 0748   CO2 23 08/17/2012 0748   GLUCOSE 189* 08/17/2012 0748   BUN 41* 08/17/2012 0748   CREATININE 8.86* 08/17/2012 0748   CALCIUM 9.0 08/17/2012 0748   GFRNONAA 5* 08/17/2012 0748   GFRAA 6* 08/17/2012 1610    Leonides Sake, MD [facility] Office: [phone] Pager: [phone]  08/17/2012, 8:43 PM

## 2012-08-17 NOTE — Progress Notes (Signed)
Subjective: Frustrated with abd pain in pelvic area with PD fills   Recent Labs Lab 08/16/12 0400 08/16/12 0404 08/17/12 0525  WBC 10.9*  --  9.4  NEUTROABS  --   --  7.3  HGB 16.8* 18.4* 13.6  HCT 47.1* 54.0* 37.1  MCV 86.7  --  85.7  PLT 359  --  249    Recent Labs Lab 08/16/12 0400 08/16/12 0404 08/17/12 0748  NA 133* 134* 130*  K 2.5* 2.4* 2.5*  CL 88* 97 90*  CO2 23  --  23  GLUCOSE 520* 526* 189*  BUN 45* 43* 41*  CREATININE 9.49* 9.10* 8.86*  CALCIUM 11.1*  --  9.0  PHOS 7.5*  --  7.2*   Physical Exam:  Blood pressure 134/63, pulse 94, temperature 99 F (37.2 C), temperature source Oral, resp. rate 18, height 5\' 1"  (1.549 m), weight 75.5 kg (166 lb 7.2 oz), SpO2 100.00%.  Gen: alert no distress Neck: no JVD, L IJ cath out Lungs: clear bilat Heart: RRR with S1 S2. No murmurs, rubs, or gallops appreciated.  Abdomen: Soft, non-tender, LLQ PD cath with no drainage M-S: Strength and tone appear normal for age.  Lower extremities: without edema or ischemic changes, no open wounds  Neuro: alert, Ox3, nonfocal  Access: left I-J (placed 6/27 Dr. Juel Burrow), LUA AVF patent  Dialysis Orders [facility])  CCPD 6 exchanges 6 fills; 2 L last fill 1L dwell 1:45 daytime exchange fill 2 l Epo 19K SQ 2x weekly; Venofer 100/week plus give 300 venofer 7/25; on Ancef 1 gm IP q daytime dwell through 8/5 = 3 weeks for + corynebacterium PD fluid cultures 7/16. Prior cell count 7/15 was 38 with 75% N, but pt had suboptimal specimen  Recent labs; hgb 9.3 7/3, ferritin 406 and 22% sat 7/3; iPTH 789 7/3   Assessment/Plan:  1. Abdominal pain- having pain with PD fills now. Has had multiple complications from PD since initiating 3 mos ago (reposition x 2, peritonitis x1 and now pain with filling), also not eating well. Discussed possibility of switching to HD, and patient does want to change to HD at this time.  We will stop PD, consult surg for cath removal and request replacement of  tunneled HD cath.  She has a LUA AVF placed in June 2. Peritonitis- will start vanc IV for corynebacterium peritonitis 3wks total thru 8/7 3. Vol depletion- orthostatic today, standing bp 80's >> stop PD, bolus IVF"s 4. ESRD - changing to HD , as above, will need CLIP to OP unit 5. Hypokalemia, replete 6. HTN- metoprolol on hold, giving IVF 7. 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 Nutrition - renal diet 10. DM - per primary   [name]  MD Pager [phone]    Cell  [phone] 08/17/2012, 2:32 PM

## 2012-08-17 NOTE — Progress Notes (Signed)
ANTIBIOTIC CONSULT NOTE - INITIAL  Pharmacy Consult for Vancomycin Indication: potential intrabdominal infection  No Known Allergies  Patient Measurements: Height: 5\' 1"  (154.9 cm) Weight: 166 lb 7.2 oz (75.5 kg) IBW/kg (Calculated) : 47.8   Vital Signs: Temp: 99 F (37.2 C) (07/26 1322) Temp src: Oral (07/26 0408) BP: 134/63 mmHg (07/26 1322) Pulse Rate: 94 (07/26 1322) Intake/Output from previous day: 07/25 0701 - 07/26 0700 In: 240 [P.O.:240] Out: -  Intake/Output from this shift: Total I/O In: 110 [P.O.:110] Out: 5 [Urine:5]  Labs:  Recent Labs  08/16/12 0400 08/16/12 0404 08/17/12 0525 08/17/12 0748  WBC 10.9*  --  9.4  --   HGB 16.8* 18.4* 13.6  --   PLT 359  --  249  --   CREATININE 9.49* 9.10*  --  8.86*   Estimated Creatinine Clearance: 7.6 ml/min (by C-G formula based on Cr of 8.86).   Microbiology: Recent Results (from the past 720 hour(s))  BODY FLUID CULTURE     Status: None   Collection Time    08/16/12  4:26 AM      Result Value Range Status   Specimen Description PERITONEAL DIALYSATE   Final   Special Requests NONE   Final   Gram Stain     Final   Value: FEW WBC PRESENT, PREDOMINANTLY PMN     NO ORGANISMS SEEN   Culture PENDING   Incomplete   Report Status PENDING   Incomplete    Medical History: Past Medical History  Diagnosis Date  . Bell's palsy 05/12/2008  . DIABETES MELLITUS, TYPE II 03/16/2008  . HYPERTENSION 01/06/2010  . OBESITY 03/16/2008  . Proliferative diabetic retinopathy(362.02) 04/23/2009  . Fibroid, uterine   . ESRD (end stage renal disease)     ESRD on HD  . Anemia   . CAD (coronary artery disease) 01/2012    a. Cardiac cath done for abn nuc 02/23/2012 - 2 vessel CAD s/p DES to mid-LAD, residual disease in the PL OM for medical therapy.   . End-stage renal disease on peritoneal dialysis 03/12/2012    PD cath placed in Mar 2014 and had tunneled HD cath placed in Jan 2014. Having problems as of Jun '14 with PD cath  draining poorly .  PD cath repositioned from LUQ to pelvis by Dr Derrell Lolling on 07/07/12 by lap procedure.     . Shortness of breath     Assessment: 65 YOF with ESRD on PD- per renal, at home she does CCPD with a long day time dwell. She was on cefazolin as an outpatient for corynebacterium which was to continue through 8/5. She presented with worsening abdominal pain and CT showed free air and per surgery not consistent with a perforation. Pharmacy consulted to dose Zosyn IV empirically. Now change to vancomycin  Goal of Therapy:  Vanc trough 15-20 mg/L  Plan:  1. Vancomycin 2 gm IV x 1. 2.  Will check random level in 3-5 days for redose and to determine dosing interval 3. Follow up cultures/sensitivities, LOT, de-escalation, and clinical changes  Thank you for allowing me to take part in this patient's care,  Talbert Cage, PharmD Clinical Pharmacist Pager: [phone] 08/17/2012 2:48 PM

## 2012-08-17 NOTE — Progress Notes (Signed)
Spoke with Dr. Arlean Hopping re: dialysis fluid cloudy with fibrin

## 2012-08-17 NOTE — Progress Notes (Signed)
Subjective: Complains of intermittent cramping pain with infusion of PD cath  Objective: Vital signs in last 24 hours: Temp:  [98 F (36.7 C)-99.1 F (37.3 C)] 98.5 F (36.9 C) (07/26 0912) Pulse Rate:  [78-88] 88 (07/26 0912) Resp:  [18-20] 18 (07/26 0912) BP: (129-158)/(69-84) 139/77 mmHg (07/26 0912) SpO2:  [99 %-100 %] 99 % (07/26 0912) Weight:  [166 lb 7.2 oz (75.5 kg)] 166 lb 7.2 oz (75.5 kg) (07/25 1100) Last BM Date: 08/15/12  Intake/Output from previous day: 07/25 0701 - 07/26 0700 In: 240 [P.O.:240] Out: -  Intake/Output this shift: Total I/O In: 10 [P.O.:10] Out: -   Abdomen is completely soft and not really tender  Lab Results:   Recent Labs  08/16/12 0400 08/16/12 0404 08/17/12 0525  WBC 10.9*  --  9.4  HGB 16.8* 18.4* 13.6  HCT 47.1* 54.0* 37.1  PLT 359  --  249   BMET  Recent Labs  08/16/12 0400 08/16/12 0404 08/17/12 0748  NA 133* 134* 130*  K 2.5* 2.4* 2.5*  CL 88* 97 90*  CO2 23  --  23  GLUCOSE 520* 526* 189*  BUN 45* 43* 41*  CREATININE 9.49* 9.10* 8.86*  CALCIUM 11.1*  --  9.0   PT/INR No results found for this basename: LABPROT, INR,  in the last 72 hours ABG  Recent Labs  08/16/12 0707  HCO3 24.8*    Studies/Results: Ct Abdomen Pelvis Wo Contrast  08/16/2012   *RADIOLOGY REPORT*  Clinical Data: Diabetic hypertensive patient with end-stage renal disease on peritoneal dialysis.  Feeling weak which became worse when she started peritoneal dialysis.  Intermittent abdominal pain. Question perforation?  Prior hysterectomy and appendectomy.  CT ABDOMEN AND PELVIS WITHOUT CONTRAST  Technique:  Multidetector CT imaging of the abdomen and pelvis was performed following the standard protocol without intravenous contrast.  Comparison: 07/18/2012 plain film examination.  12/18/2009 CT.  Findings: Peritoneal dialysis catheter enters from the left lower abdomen and the tip curled within the pelvis.  Fluid is noted throughout the abdomen  pelvis.  Additionally, there is free intraperitoneal air most notable in the upper abdomen.  This amount of free air could conceivably be related to air introduced at the time of the peritoneal dialysis.  Bowel perforation as cause of free intraperitoneal air is not excluded. There is however, no discrete bowel defect or extraluminal contrast.  Surgical consultation and / or close follow-up imaging recommended.  Free air is more notable adjacent to the stomach.  Thickening of the third portion of the duodenum may represent under distension or mild inflammation.  Narrowing of portions of the colon (such as at the level of the hepatic flexure) may represent result of peristalsis limiting excluding underlying circumferential mass.  Clinical history states the patient is post appendectomy however, radiopaque material right abdomen has an appearance suggestive of the appendix.  Evaluation of solid abdominal viscera is limited by lack of IV contrast.  Taking this limitation into account no worrisome hepatic, splenic, pancreatic, adrenal or renal lesion.  No calcified gallstone.  Limited for evaluating for cholecystitis given the surrounding fluid.  Ultrasound can be obtained for further delineation if this is of concern.  No bony destructive lesion or CT evidence of disc space infection.  Cardiomegaly.  Suggestion of prominent coronary artery calcifications.  No abdominal aortic aneurysm.  Partial calcification left common iliac artery.  No worrisome adenopathy.  Lung bases clear.  IMPRESSION: Peritoneal dialysis catheter enters from the left lower abdomen and the  tip curled within the pelvis.  Fluid is noted throughout the abdomen pelvis.  Additionally, there is free intraperitoneal air most notable in the upper abdomen.  This amount of free air could conceivably be related to air introduced at the time of the peritoneal dialysis.  Bowel perforation as cause of free intraperitoneal air is not excluded. There is however,  no discrete bowel defect or extraluminal contrast.  Surgical consultation and / or close follow-up imaging recommended depending on clinical suspicion.  Please see above discussion.  Critical Value/emergent results were called by telephone at the time of interpretation on 08/16/2012 at 8:00 a.m. to Dr. Dan Humphreys., who verbally acknowledged these results.   Original Report Authenticated By: Lacy Duverney, M.D.   Ir Removal Tun Cv Cath W/o Fl  08/16/2012   *RADIOLOGY REPORT*  Clinical Data/Indication: Peritoneal dialysis  TUNNEL CATHETER REMOVAL  Procedure: The procedure, risks, benefits, and alternatives were explained to the patient. Questions regarding the procedure were encouraged and answered. The patient understands and consents to the procedure.  The left chest was prepped with betadine in a sterile fashion, and a sterile drape was applied covering the operative field. A sterile gown and sterile gloves were used for the procedure.  1% lidocaine was utilized for local anesthesia. Utilizing blunt dissection, the cuff of the catheter was freed from the underlying subcutaneous tissue. The catheter was removed in its entirety. Hemostasis was achieved with direct pressure.  Complications: None.  IMPRESSION: Successful tunneled dialysis catheter removal.   Original Report Authenticated By: Jolaine Click, M.D.   Dg Chest Portable 1 View  08/16/2012   *RADIOLOGY REPORT*  Clinical Data: Hypotension  PORTABLE CHEST - 1 VIEW  Comparison: Prior radiograph from 07/18/2012  Findings: A left-sided dual-lumen hemodialysis catheter is in place with tip overlying the mid SVC.  Cardiomegaly is not significantly changed as compared to prior exam.  Curvilinear lucency is seen overlying the dome of the liver, underneath the right hemidiaphragm, concerning for free intraperitoneal air.  The lungs are clear without airspace consolidation, pleural effusion, or pulmonary edema.  There is no pneumothorax on this semi erect portable exam.   IMPRESSION:  1.  Curvilinear lucency underneath the right hemidiaphragm, concerning for free intraperitoneal air.  This finding is of uncertain clinical etiology, and may be related to the presence of a peritoneal dialysis catheter.  Further evaluation with cross sectional imaging of the abdomen and pelvis could be performed as clinically indicated. 2.  Stable cardiomegaly without edema. 3.  No airspace consolidation to suggest acute infectious pneumonitis.  Critical Value/emergent results were called by telephone at the time of interpretation on 08/16/12 at 0520 am to Sunnie Nielsen by Dr. Hoy Morn, who verbally acknowledged these results.   Original Report Authenticated By: Rise Mu, M.D.    Anti-infectives: Anti-infectives   Start     Dose/Rate Route Frequency Ordered Stop   08/16/12 1500  piperacillin-tazobactam (ZOSYN) IVPB 2.25 g     2.25 g 100 mL/hr over 30 Minutes Intravenous Every 12 hours 08/16/12 1338     08/16/12 0500  piperacillin-tazobactam (ZOSYN) IVPB 3.375 g     3.375 g 12.5 mL/hr over 240 Minutes Intravenous  Once 08/16/12 0453 08/16/12 0640      Assessment/Plan: s/p * No surgery found *  Again, I doubt any bowel perforation. She may not however be tolerating PD and may just need to consider HD with PD removal Will advance diet  LOS: 1 day    Fina Heizer A 08/17/2012

## 2012-08-18 ENCOUNTER — Encounter (HOSPITAL_COMMUNITY): Payer: Self-pay | Admitting: Anesthesiology

## 2012-08-18 DIAGNOSIS — R7309 Other abnormal glucose: Secondary | ICD-10-CM

## 2012-08-18 DIAGNOSIS — I10 Essential (primary) hypertension: Secondary | ICD-10-CM

## 2012-08-18 LAB — URINE CULTURE: Colony Count: 40000

## 2012-08-18 LAB — CBC
HCT: 31.6 % — ABNORMAL LOW (ref 36.0–46.0)
Hemoglobin: 11.2 g/dL — ABNORMAL LOW (ref 12.0–15.0)
MCH: 30.8 pg (ref 26.0–34.0)
MCHC: 35.4 g/dL (ref 30.0–36.0)
MCV: 86.8 fL (ref 78.0–100.0)
Platelets: 188 10*3/uL (ref 150–400)
RBC: 3.64 MIL/uL — ABNORMAL LOW (ref 3.87–5.11)
RDW: 14.2 % (ref 11.5–15.5)
WBC: 7.5 10*3/uL (ref 4.0–10.5)

## 2012-08-18 LAB — TROPONIN I: Troponin I: <0.3 ng/mL (ref <0.30)

## 2012-08-18 LAB — BASIC METABOLIC PANEL
BUN: 51 mg/dL — ABNORMAL HIGH (ref 6–23)
CO2: 21 mEq/L (ref 19–32)
Calcium: 8 mg/dL — ABNORMAL LOW (ref 8.4–10.5)
Chloride: 97 mEq/L (ref 96–112)
Creatinine, Ser: 10.2 mg/dL — ABNORMAL HIGH (ref 0.50–1.10)
GFR calc Af Amer: 5 mL/min — ABNORMAL LOW (ref >90)
GFR calc non Af Amer: 4 mL/min — ABNORMAL LOW (ref >90)
Glucose, Bld: 100 mg/dL — ABNORMAL HIGH (ref 70–99)
Potassium: 2.5 mEq/L — CL (ref 3.5–5.1)
Sodium: 133 mEq/L — ABNORMAL LOW (ref 135–145)

## 2012-08-18 LAB — GLUCOSE, CAPILLARY
Glucose-Capillary: 110 mg/dL — ABNORMAL HIGH (ref 70–99)
Glucose-Capillary: 128 mg/dL — ABNORMAL HIGH (ref 70–99)
Glucose-Capillary: 136 mg/dL — ABNORMAL HIGH (ref 70–99)
Glucose-Capillary: 86 mg/dL (ref 70–99)
Glucose-Capillary: 98 mg/dL (ref 70–99)

## 2012-08-18 LAB — LACTIC ACID, PLASMA: Lactic Acid, Venous: 0.6 mmol/L (ref 0.5–2.2)

## 2012-08-18 MED ORDER — POTASSIUM CHLORIDE 10 MEQ/100ML IV SOLN
10.0000 meq | INTRAVENOUS | Status: AC
  Administered 2012-08-18: 10 meq via INTRAVENOUS
  Filled 2012-08-18 (×4): qty 100

## 2012-08-18 MED ORDER — POTASSIUM CHLORIDE 20 MEQ/15ML (10%) PO LIQD
40.0000 meq | Freq: Once | ORAL | Status: AC
  Administered 2012-08-18: 40 meq via ORAL
  Filled 2012-08-18: qty 30

## 2012-08-18 MED ORDER — RENA-VITE PO TABS
1.0000 | ORAL_TABLET | Freq: Every day | ORAL | Status: DC
  Administered 2012-08-18 – 2012-08-21 (×4): 1 via ORAL
  Filled 2012-08-18 (×4): qty 1

## 2012-08-18 MED ORDER — DOXERCALCIFEROL 4 MCG/2ML IV SOLN
2.0000 ug | INTRAVENOUS | Status: DC
  Administered 2012-08-19 – 2012-08-21 (×2): 2 ug via INTRAVENOUS
  Filled 2012-08-18 (×2): qty 2

## 2012-08-18 MED ORDER — FLUCONAZOLE 200 MG PO TABS
200.0000 mg | ORAL_TABLET | Freq: Every day | ORAL | Status: DC
  Administered 2012-08-18 – 2012-08-19 (×2): 200 mg via ORAL
  Filled 2012-08-18 (×3): qty 1

## 2012-08-18 NOTE — Progress Notes (Signed)
[facility] Progress Note  Subjective:   Abd pain. No further emesis. Arm burning (sec to K+ infusion for K+ of 2.5)  Objective Filed Vitals:   08/17/12 1322 08/17/12 1713 08/17/12 1952 08/18/12 0402  BP: 134/63 130/73 132/67 118/59  Pulse: 94 92 89 84  Temp: 99 F (37.2 C) 99.8 F (37.7 C) 99.3 F (37.4 C) 99.1 F (37.3 C)  TempSrc:   Oral Oral  Resp: 18 18 18 18   Height:      Weight:   74.6 kg (164 lb 7.4 oz)   SpO2: 100% 100% 100% 100%   Physical Exam General: Sleepy but arousable. NAD Heart: RRR, no murmur or rub appreciated Lungs: CTA bilaterally. No wheezes, rales, rhonci noted Abdomen: Soft, mildly tender, normal BS Extremities: No LE edema Dialysis Access: PD cath LLQ/ LUA AVF + bruit  Dialysis Orders [facility])  CCPD 6 exchanges 6 fills; 2 L last fill 1L dwell 1:45 daytime exchange fill 2 l Epo 19K SQ 2x weekly; Venofer 100/week plus give 300 venofer 7/25; on Ancef 1 gm IP q daytime dwell through 8/5 = 3 weeks for + corynebacterium PD fluid cultures 7/16. Prior cell count 7/15 was 38 with 75% N, but pt had suboptimal specimen  Recent labs; hgb 9.3 7/3, ferritin 406 and 22% sat 7/3; iPTH 789 7/3    Assessment/Plan: 1. Abdominal pain-  Has had multiple complications from PD since initiating 3 mos ago (reposition x 2, peritonitis x1 and now pain with filling). Amenable to switching to HD. We will stop PD, consult surg for cath removal. Tunneled HD cath placement planned for Monday per Dr. Imogene Burn.  She has a LUA AVF placed in June 2. Peritonitis - On vanc IV for corynebacterium peritonitis 3 wks total thru 8/7 and cefuroxime 3. Vol depletion - resolving 4. ESRD - changing to HD as above, plan for 1st tx tomorrow after cath placement. Will need CLIP to OP unit 5. Hypokalemia - K 2.5 this a.m. repletion in progress. Added K bath tomorrow if needed. 6. HTN - SBPs 110s-130s after fluid bolus.Toprol resumed. 7. Anemia - Hgb now 11.2. On EPO 19K BIW at  home, was held due to hemoconcentration. Monitor CBC 8. MBD - ^ Phos. Hypercalcemia resolving. Corrected Ca now 9.1. Resume Vit D. Needs a non calcium binder once eating better. 9. Nutrition - renal diet, multivitamin 10. DM - per primary  Scot Jun. Thad Ranger [facility] Pager [phone] 08/18/2012,8:40 AM  LOS: 2 days   Patient seen and examined.  Agree with assessment and plan as above.  For tunneled HD cath placement and HD tomorrow, have also consulted Gen Surg for PD cath removal.  Plan as above. [name]  MD Pager [phone]    Cell  [phone] 08/18/2012, 2:00 PM     Additional Objective Labs: Basic Metabolic Panel:  Recent Labs Lab 08/16/12 0400 08/16/12 0404 08/17/12 0748 08/18/12 0520  NA 133* 134* 130* 133*  K 2.5* 2.4* 2.5* 2.5*  CL 88* 97 90* 97  CO2 23  --  23 21  GLUCOSE 520* 526* 189* 100*  BUN 45* 43* 41* 51*  CREATININE 9.49* 9.10* 8.86* 10.20*  CALCIUM 11.1*  --  9.0 8.0*  PHOS 7.5*  --  7.2*  --    Liver Function Tests:  Recent Labs Lab 08/16/12 0400 08/17/12 0748  AST 30  --   ALT <5  --   ALKPHOS 122*  --   BILITOT 0.3  --  PROT 9.6*  --   ALBUMIN 3.6 2.6*    Recent Labs Lab 08/16/12 0400  LIPASE 20   CBC:  Recent Labs Lab 08/16/12 0400 08/16/12 0404 08/17/12 0525 08/18/12 0520  WBC 10.9*  --  9.4 7.5  NEUTROABS  --   --  7.3  --   HGB 16.8* 18.4* 13.6 11.2*  HCT 47.1* 54.0* 37.1 31.6*  MCV 86.7  --  85.7 86.8  PLT 359  --  249 188   Blood Culture    Component Value Date/Time   SDES PERITONEAL DIALYSATE 08/16/2012 0426   SPECREQUEST NONE 08/16/2012 0426   CULT NO GROWTH 1 DAY 08/16/2012 0426   REPTSTATUS PENDING 08/16/2012 0426    Cardiac Enzymes:  Recent Labs Lab 08/17/12 1407 08/17/12 1915 08/18/12 0112  TROPONINI <0.30 <0.30 <0.30   CBG:  Recent Labs Lab 08/17/12 1713 08/17/12 1951 08/17/12 2351 08/18/12 0357 08/18/12 0725  GLUCAP 104* 149* 136* 98 86   Studies/Results: Ir  Removal Tun Cv Cath W/o Fl  08/16/2012   *RADIOLOGY REPORT*  Clinical Data/Indication: Peritoneal dialysis  TUNNEL CATHETER REMOVAL  Procedure: The procedure, risks, benefits, and alternatives were explained to the patient. Questions regarding the procedure were encouraged and answered. The patient understands and consents to the procedure.  The left chest was prepped with betadine in a sterile fashion, and a sterile drape was applied covering the operative field. A sterile gown and sterile gloves were used for the procedure.  1% lidocaine was utilized for local anesthesia. Utilizing blunt dissection, the cuff of the catheter was freed from the underlying subcutaneous tissue. The catheter was removed in its entirety. Hemostasis was achieved with direct pressure.  Complications: None.  IMPRESSION: Successful tunneled dialysis catheter removal.   Original Report Authenticated By: Jolaine Click, M.D.   Dg Abd Acute W/chest  08/17/2012   *RADIOLOGY REPORT*  Clinical Data: 3 days of abdominal pain.  ACUTE ABDOMEN SERIES (ABDOMEN 2 VIEW & CHEST 1 VIEW)  Comparison: CT abdomen and pelvis from yesterday.  Findings: Pneumoperitoneum persists, located in the right diaphragm. There is also small volume sub hepatic gas.  Bowel gas pattern is nonobstructed, with progression of positive enteric contrast through the colon. There are some fluid levels within the colon, nonspecific.  Chain sutures seen in the right lower quadrant.  Peritoneal dialysis catheter continues project over the low pelvis.  No abnormal intra-abdominal mass effect.  No acute osseous findings.  The lungs are clear.  No effusion or pneumothorax.  Normal heart size and mediastinal contours.  These results were called by telephone on 08/17/2012 at 22:35 to [facility] Benedetto Coons, who verbally acknowledged these results.  IMPRESSION:  1.  Persistent pneumoperitoneum, likely similar volume compared to yesterday.  The gas could originate from a peritoneal dialysis  catheter or a hollow viscus perforation.   2. Nonobstructive bowel gas pattern.   Original Report Authenticated By: Tiburcio Pea   Medications:   . aspirin  81 mg Oral Daily  . [START ON 08/19/2012] cefUROXime (ZINACEF)  IV  1.5 g Intravenous 60 min Pre-Op  . clopidogrel  75 mg Oral Q breakfast  . insulin aspart  0-9 Units Subcutaneous Q4H  . metoprolol succinate  25 mg Oral Daily  . potassium chloride  10 mEq Intravenous Q1 Hr x 4  . sodium chloride  3 mL Intravenous Q12H

## 2012-08-18 NOTE — Progress Notes (Signed)
Received call from Amy at [facility] with critical value. The peritoneal diasylate fluid sample; drawn on 08/16/12 @ 04:26, has grown yeast. Dr. Arbutus Leas notified. M.D. Gave order to start patient on Diflucan 200mg .

## 2012-08-18 NOTE — Progress Notes (Signed)
Patient's Potassium level = 2.5. M.D. ordered 4 runs of IV Potassium (see MAR). During first run, patient c/o pain with IV Potassium running. Rate reduced to 76mL/hour. Patient tolerated one run of Potassium but stated pain still intense and requested alternate route for remaining Potassium. Contacted Dr. Arbutus Leas. Order given for PO liquid Potassium (see MAR).  Will hold remaining 3 runs of IV Potassium.  Will continue to monitor IV site.

## 2012-08-18 NOTE — Progress Notes (Signed)
General surgery attending note:  History: Patient doing well. Eat lunch with a great enthusiasm. Denies abdominal pain nausea or vomiting.  Exam: Patient looks well. Mental status normal. No distress. Abdomen: Soft. Nontender. Nondistended. Benign exam.  Assessment/Plan: Benign pneumoperitoneum. Suspect this must be related to her PD in some way.                        Doubt intra-abdominal obstructive, inflammatory, neoplastic, or ischemic process require surgery at this time.                         General surgery will sign off. Please reconsult as necessary.   Angelia Mould. Derrell Lolling, M.D., [facility] General and Minimally invasive Surgery Breast and Colorectal Surgery Office:   [phone] Pager:   [phone]

## 2012-08-18 NOTE — Progress Notes (Signed)
CRITICAL VALUE ALERT  Critical value received:  K+ 2.5  Date of notification:  08/18/12  Time of notification:  6:51  Critical value read back: yes  Nurse who received alert:  Sharen Heck, RN  MD notified (1st page):  Benedetto Coons, NP  Time of first page:  06:58  MD notified (2nd page):  Time of second page:  Responding MD:  Benedetto Coons, NP  Time MD responded:  7:00

## 2012-08-18 NOTE — Progress Notes (Signed)
Call by RN to report abnormal PD fluid culture from 7/25. Preliminary report--PD fluid grew a yeast.   Although the patient did not have significant pleocytosis in PD fluid (73 WBC--52%Neutrophils), pt continues to have abdominal pain and cloudy dialysate.  Will start patient on empiric fluconazole, 200mg /d, pending ID of yeast.  If yeast is C. glabrata or C. krusei, then will need to change to echinocandin. PD catheter will need to be removed.  Plans for HD catheter on 08/19/12.  Blood cultures remain negative.  Pt is afebrile and hemodynamically stable.  Since fluconazole has >90% bioavailability-->can give it po.  DTat

## 2012-08-18 NOTE — Progress Notes (Signed)
[facility] PROGRESS NOTE  Lori Greer:[id number] DOB: [birth year] DOA: 08/16/2012 PCP: Rogelia Boga, MD  Assessment/Plan: Abdominal pain  -improved -CT abdomen without any bowel defects or extraluminal contrast  -May be related to hyperosmolar nonketotic state secondary to Lori Greer's profound hyperglycemia--520 on admission  -Greer states abdominal pain markedly improved -Greer requests advancing diet--> advanced to renal diet -PD fluid cell count does not suggest peritonitis--> follow culture  -Lipase 20  -agree with D/C zosyn -PD catheter placed March 2014  ? Corynebacterium peritonitis?  -Deferred to nephrology service  -PD fluid Cell count on July 15--->30  -IP/IV Vancomycin per nephrology -Plan noted for conversion to HD, HD catheter 08/19/2012 Diabetes mellitus type 2  -Hemoglobin A1c 5.7  -Acetone negative  -Greer states that she drank "regular" Coca[employer]a prior to ED arrival -Continue NovoLog sliding scale for now  -Greer only intermittently uses insulin at home--> only used it once in Lori past week  -Will not need insulin at Lori time of discharge  Gap metabolic acidosis  -Partly a lactic acidosis--3.60 on 08/16/2012  -Recheck lactic acid 0.6  CKD stage V, on peritoneal dialysis  -Appreciate renal followup  Hypokalemia  -Replete judiciously po (could not tolerate IV replacement) -Check magnesium--2.3 Hypertension  -Restart metoprolol succinate  Coronary artery disease  -DES to mid LAD 02/23/2012  -Restart aspirin and Plavix  Family Communication: Pt at beside  Disposition Plan: Home when medically stable  Antibiotics:  Zosyn 08/16/2012>>>08/17/12         Procedures/Studies: Ct Abdomen Pelvis Wo Contrast  08/16/2012   *RADIOLOGY REPORT*  Clinical Data: Diabetic hypertensive Greer with end-stage renal disease on peritoneal dialysis.  Feeling weak which became worse when she started peritoneal dialysis.  Intermittent  abdominal pain. Question perforation?  Prior hysterectomy and appendectomy.  CT ABDOMEN AND PELVIS WITHOUT CONTRAST  Technique:  Multidetector CT imaging of Lori abdomen and pelvis was performed following Lori standard protocol without intravenous contrast.  Comparison: 07/18/2012 plain film examination.  12/18/2009 CT.  Findings: Peritoneal dialysis catheter enters from Lori left lower abdomen and Lori tip curled within Lori pelvis.  Fluid is noted throughout Lori abdomen pelvis.  Additionally, there is free intraperitoneal air most notable in Lori upper abdomen.  This amount of free air could conceivably be related to air introduced at Lori time of Lori peritoneal dialysis.  Bowel perforation as cause of free intraperitoneal air is not excluded. There is however, no discrete bowel defect or extraluminal contrast.  Surgical consultation and / or close follow-up imaging recommended.  Free air is more notable adjacent to Lori stomach.  Thickening of Lori third portion of Lori duodenum may represent under distension or mild inflammation.  Narrowing of portions of Lori colon (such as at Lori level of Lori hepatic flexure) may represent result of peristalsis limiting excluding underlying circumferential mass.  Clinical history states Lori Greer is post appendectomy however, radiopaque material right abdomen has an appearance suggestive of Lori appendix.  Evaluation of solid abdominal viscera is limited by lack of IV contrast.  Taking this limitation into account no worrisome hepatic, splenic, pancreatic, adrenal or renal lesion.  No calcified gallstone.  Limited for evaluating for cholecystitis given Lori surrounding fluid.  Ultrasound can be obtained for further delineation if this is of concern.  No bony destructive lesion or CT evidence of disc space infection.  Cardiomegaly.  Suggestion of prominent coronary artery calcifications.  No abdominal aortic aneurysm.  Partial calcification left common iliac artery.  No worrisome  adenopathy.  Lung bases clear.  IMPRESSION: Peritoneal dialysis catheter enters from Lori left lower abdomen and Lori tip curled within Lori pelvis.  Fluid is noted throughout Lori abdomen pelvis.  Additionally, there is free intraperitoneal air most notable in Lori upper abdomen.  This amount of free air could conceivably be related to air introduced at Lori time of Lori peritoneal dialysis.  Bowel perforation as cause of free intraperitoneal air is not excluded. There is however, no discrete bowel defect or extraluminal contrast.  Surgical consultation and / or close follow-up imaging recommended depending on clinical suspicion.  Please see above discussion.  Critical Value/emergent results were called by telephone at Lori time of interpretation on 08/16/2012 at 8:00 a.m. to Dr. Dan Humphreys., who verbally acknowledged these results.   Original Report Authenticated By: Lacy Duverney, M.D.   Ir Removal Tun Cv Cath W/o Fl  08/16/2012   *RADIOLOGY REPORT*  Clinical Data/Indication: Peritoneal dialysis  TUNNEL CATHETER REMOVAL  Procedure: Lori procedure, risks, benefits, and alternatives were explained to Lori Greer. Questions regarding Lori procedure were encouraged and answered. Lori Greer understands and consents to Lori procedure.  Lori left chest was prepped with betadine in a sterile fashion, and a sterile drape was applied covering Lori operative field. A sterile gown and sterile gloves were used for Lori procedure.  1% lidocaine was utilized for local anesthesia. Utilizing blunt dissection, Lori cuff of Lori catheter was freed from Lori underlying subcutaneous tissue. Lori catheter was removed in its entirety. Hemostasis was achieved with direct pressure.  Complications: None.  IMPRESSION: Successful tunneled dialysis catheter removal.   Original Report Authenticated By: Jolaine Click, M.D.   Dg Chest Portable 1 View  08/16/2012   *RADIOLOGY REPORT*  Clinical Data: Hypotension  PORTABLE CHEST - 1 VIEW  Comparison: Prior  radiograph from 07/18/2012  Findings: A left-sided dual-lumen hemodialysis catheter is in place with tip overlying Lori mid SVC.  Cardiomegaly is not significantly changed as compared to prior exam.  Curvilinear lucency is seen overlying Lori dome of Lori liver, underneath Lori right hemidiaphragm, concerning for free intraperitoneal air.  Lori lungs are clear without airspace consolidation, pleural effusion, or pulmonary edema.  There is no pneumothorax on this semi erect portable exam.  IMPRESSION:  1.  Curvilinear lucency underneath Lori right hemidiaphragm, concerning for free intraperitoneal air.  This finding is of uncertain clinical etiology, and may be related to Lori presence of a peritoneal dialysis catheter.  Further evaluation with cross sectional imaging of Lori abdomen and pelvis could be performed as clinically indicated. 2.  Stable cardiomegaly without edema. 3.  No airspace consolidation to suggest acute infectious pneumonitis.  Critical Value/emergent results were called by telephone at Lori time of interpretation on 08/16/12 at 0520 am to Sunnie Nielsen by Dr. Hoy Morn, who verbally acknowledged these results.   Original Report Authenticated By: Rise Mu, M.D.   Dg Abd Acute W/chest  08/17/2012   *RADIOLOGY REPORT*  Clinical Data: 3 days of abdominal pain.  ACUTE ABDOMEN SERIES (ABDOMEN 2 VIEW & CHEST 1 VIEW)  Comparison: CT abdomen and pelvis from yesterday.  Findings: Pneumoperitoneum persists, located in Lori right diaphragm. There is also small volume sub hepatic gas.  Bowel gas pattern is nonobstructed, with progression of positive enteric contrast through Lori colon. There are some fluid levels within Lori colon, nonspecific.  Chain sutures seen in Lori right lower quadrant.  Peritoneal dialysis catheter continues project over Lori low pelvis.  No abnormal intra-abdominal mass effect.  No acute osseous findings.  Lori lungs are clear.  No effusion or pneumothorax.  Normal heart size and  mediastinal contours.  These results were called by telephone on 08/17/2012 at 22:35 to [facility] Benedetto Coons, who verbally acknowledged these results.  IMPRESSION:  1.  Persistent pneumoperitoneum, likely similar volume compared to yesterday.  Lori gas could originate from a peritoneal dialysis catheter or a hollow viscus perforation.   2. Nonobstructive bowel gas pattern.   Original Report Authenticated By: Tiburcio Pea         Subjective: Greer states abdominal pain has improved. Denies fevers, chills, chest pain, shortness of breath, nausea, vomiting, diarrhea, abdominal pain. No dizziness or headache.  Objective: Filed Vitals:   08/17/12 1713 08/17/12 1952 08/18/12 0402 08/18/12 0911  BP: 130/73 132/67 118/59 126/90  Pulse: 92 89 84 83  Temp: 99.8 F (37.7 C) 99.3 F (37.4 C) 99.1 F (37.3 C) 98.7 F (37.1 C)  TempSrc:  Oral Oral   Resp: 18 18 18 18   Height:      Weight:  74.6 kg (164 lb 7.4 oz)    SpO2: 100% 100% 100% 97%    Intake/Output Summary (Last 24 hours) at 08/18/12 1202 Last data filed at 08/18/12 0911  Gross per 24 hour  Intake 1993.33 ml  Output      5 ml  Net 1988.33 ml   Weight change: -0.9 kg (-1 lb 15.8 oz) Exam:   General:  Pt is alert, follows commands appropriately, not in acute distress  HEENT: No icterus, No thrush,  [place]/AT  Cardiovascular: RRR, S1/S2, no rubs, no gallops  Respiratory: CTA bilaterally, no wheezing, no crackles, no rhonchi  Abdomen: Soft/+BS, non tender, non distended, no guarding  Extremities: No edema, No lymphangitis, No petechiae, No rashes, no synovitis  Data Reviewed: Basic Metabolic Panel:  Recent Labs Lab 08/16/12 0400 08/16/12 0404 08/17/12 0748 08/18/12 0520  NA 133* 134* 130* 133*  K 2.5* 2.4* 2.5* 2.5*  CL 88* 97 90* 97  CO2 23  --  23 21  GLUCOSE 520* 526* 189* 100*  BUN 45* 43* 41* 51*  CREATININE 9.49* 9.10* 8.86* 10.20*  CALCIUM 11.1*  --  9.0 8.0*  MG 2.3  --   --   --   PHOS 7.5*  --  7.2*  --     Liver Function Tests:  Recent Labs Lab 08/16/12 0400 08/17/12 0748  AST 30  --   ALT <5  --   ALKPHOS 122*  --   BILITOT 0.3  --   PROT 9.6*  --   ALBUMIN 3.6 2.6*    Recent Labs Lab 08/16/12 0400  LIPASE 20   No results found for this basename: AMMONIA,  in Lori last 168 hours CBC:  Recent Labs Lab 08/16/12 0400 08/16/12 0404 08/17/12 0525 08/18/12 0520  WBC 10.9*  --  9.4 7.5  NEUTROABS  --   --  7.3  --   HGB 16.8* 18.4* 13.6 11.2*  HCT 47.1* 54.0* 37.1 31.6*  MCV 86.7  --  85.7 86.8  PLT 359  --  249 188   Cardiac Enzymes:  Recent Labs Lab 08/17/12 1407 08/17/12 1915 08/18/12 0112  TROPONINI <0.30 <0.30 <0.30   BNP: No components found with this basename: POCBNP,  CBG:  Recent Labs Lab 08/17/12 1951 08/17/12 2351 08/18/12 0357 08/18/12 0725 08/18/12 1113  GLUCAP 149* 136* 98 86 110*    Recent Results (from Lori past 240 hour(s))  BODY FLUID CULTURE     Status:  None   Collection Time    08/16/12  4:26 AM      Result Value Range Status   Specimen Description PERITONEAL DIALYSATE   Final   Special Requests NONE   Final   Gram Stain     Final   Value: FEW WBC PRESENT, PREDOMINANTLY PMN     NO ORGANISMS SEEN   Culture NO GROWTH 1 DAY   Final   Report Status PENDING   Incomplete     Scheduled Meds: . aspirin  81 mg Oral Daily  . [START ON 08/19/2012] cefUROXime (ZINACEF)  IV  1.5 g Intravenous 60 min Pre-Op  . clopidogrel  75 mg Oral Q breakfast  . [START ON 08/19/2012] doxercalciferol  2 mcg Intravenous Q M,W,F-HD  . insulin aspart  0-9 Units Subcutaneous Q4H  . metoprolol succinate  25 mg Oral Daily  . multivitamin  1 tablet Oral Daily  . sodium chloride  3 mL Intravenous Q12H   Continuous Infusions:    Caelen Higinbotham, DO  [facility] Pager [phone]  If 7PM-7AM, please contact night-coverage [email or web address] Password [email or web address] 08/18/2012, 12:02 PM   LOS: 2 days

## 2012-08-19 ENCOUNTER — Inpatient Hospital Stay (HOSPITAL_COMMUNITY): Payer: Medicare Other | Admitting: Anesthesiology

## 2012-08-19 ENCOUNTER — Encounter (HOSPITAL_COMMUNITY)
Admission: EM | Disposition: A | Discharge status: Home / Self Care | Payer: Self-pay | Source: Home / Self Care | Attending: Internal Medicine

## 2012-08-19 ENCOUNTER — Encounter (HOSPITAL_COMMUNITY): Payer: Self-pay | Admitting: Anesthesiology

## 2012-08-19 ENCOUNTER — Inpatient Hospital Stay (HOSPITAL_COMMUNITY): Payer: Medicare Other

## 2012-08-19 DIAGNOSIS — N186 End stage renal disease: Secondary | ICD-10-CM

## 2012-08-19 DIAGNOSIS — T82898A Other specified complication of vascular prosthetic devices, implants and grafts, initial encounter: Secondary | ICD-10-CM

## 2012-08-19 DIAGNOSIS — E119 Type 2 diabetes mellitus without complications: Secondary | ICD-10-CM

## 2012-08-19 DIAGNOSIS — K659 Peritonitis, unspecified: Secondary | ICD-10-CM

## 2012-08-19 HISTORY — PX: INSERTION OF DIALYSIS CATHETER: SHX1324

## 2012-08-19 LAB — GLUCOSE, CAPILLARY
Glucose-Capillary: 104 mg/dL — ABNORMAL HIGH (ref 70–99)
Glucose-Capillary: 127 mg/dL — ABNORMAL HIGH (ref 70–99)
Glucose-Capillary: 129 mg/dL — ABNORMAL HIGH (ref 70–99)
Glucose-Capillary: 133 mg/dL — ABNORMAL HIGH (ref 70–99)
Glucose-Capillary: 143 mg/dL — ABNORMAL HIGH (ref 70–99)
Glucose-Capillary: 146 mg/dL — ABNORMAL HIGH (ref 70–99)
Glucose-Capillary: 151 mg/dL — ABNORMAL HIGH (ref 70–99)

## 2012-08-19 LAB — SURGICAL PCR SCREEN
MRSA, PCR: NEGATIVE
Staphylococcus aureus: NEGATIVE

## 2012-08-19 LAB — BASIC METABOLIC PANEL
BUN: 73 mg/dL — ABNORMAL HIGH (ref 6–23)
CO2: 18 mEq/L — ABNORMAL LOW (ref 19–32)
Calcium: 8.2 mg/dL — ABNORMAL LOW (ref 8.4–10.5)
Chloride: 98 mEq/L (ref 96–112)
Creatinine, Ser: 12.28 mg/dL — ABNORMAL HIGH (ref 0.50–1.10)
GFR calc Af Amer: 4 mL/min — ABNORMAL LOW (ref >90)
GFR calc non Af Amer: 3 mL/min — ABNORMAL LOW (ref >90)
Glucose, Bld: 133 mg/dL — ABNORMAL HIGH (ref 70–99)
Potassium: 2.7 mEq/L — CL (ref 3.5–5.1)
Sodium: 133 mEq/L — ABNORMAL LOW (ref 135–145)

## 2012-08-19 SURGERY — INSERTION OF DIALYSIS CATHETER
Anesthesia: Monitor Anesthesia Care | Laterality: Left | Wound class: Clean

## 2012-08-19 MED ORDER — HEPARIN SODIUM (PORCINE) 1000 UNIT/ML IJ SOLN
INTRAMUSCULAR | Status: DC | PRN
  Administered 2012-08-19: 5000 [IU] via INTRAVENOUS

## 2012-08-19 MED ORDER — SORBITOL 70 % SOLN: 30.0000 mL | Freq: Every day | Status: DC | PRN

## 2012-08-19 MED ORDER — SODIUM CHLORIDE 0.9 % IR SOLN
Status: DC | PRN
  Administered 2012-08-19: 10:00:00

## 2012-08-19 MED ORDER — POTASSIUM CHLORIDE CRYS ER 20 MEQ PO TBCR
30.0000 meq | EXTENDED_RELEASE_TABLET | Freq: Four times a day (QID) | ORAL | Status: AC
  Administered 2012-08-19 (×2): 30 meq via ORAL
  Filled 2012-08-19 (×2): qty 1

## 2012-08-19 MED ORDER — FENTANYL CITRATE 0.05 MG/ML IJ SOLN
INTRAMUSCULAR | Status: DC | PRN
  Administered 2012-08-19 (×2): 25 ug via INTRAVENOUS
  Administered 2012-08-19: 50 ug via INTRAVENOUS
  Administered 2012-08-19 (×2): 25 ug via INTRAVENOUS

## 2012-08-19 MED ORDER — MIDAZOLAM HCL 5 MG/5ML IJ SOLN
INTRAMUSCULAR | Status: DC | PRN
  Administered 2012-08-19: 2 mg via INTRAVENOUS

## 2012-08-19 MED ORDER — DEXTROSE 5 % IV SOLN
INTRAVENOUS | Status: AC
  Filled 2012-08-19: qty 50

## 2012-08-19 MED ORDER — IOHEXOL 300 MG/ML  SOLN
INTRAMUSCULAR | Status: DC | PRN
  Administered 2012-08-19: 38 mL via INTRAVENOUS

## 2012-08-19 MED ORDER — SODIUM CHLORIDE 0.9 % IV SOLN
INTRAVENOUS | Status: DC
  Administered 2012-08-19 – 2012-08-20 (×2): via INTRAVENOUS

## 2012-08-19 MED ORDER — LIDOCAINE-EPINEPHRINE (PF) 1 %-1:200000 IJ SOLN
INTRAMUSCULAR | Status: DC | PRN
  Administered 2012-08-19: 30 mL

## 2012-08-19 MED ORDER — SORBITOL 70 % SOLN
30.0000 mL | Freq: Once | Status: AC
  Administered 2012-08-19: 30 mL via ORAL
  Filled 2012-08-19: qty 30

## 2012-08-19 MED ORDER — 0.9 % SODIUM CHLORIDE (POUR BTL) OPTIME
TOPICAL | Status: DC | PRN
  Administered 2012-08-19: 1000 mL

## 2012-08-19 MED ORDER — CEFUROXIME SODIUM 1.5 G IJ SOLR
INTRAMUSCULAR | Status: AC
  Administered 2012-08-19: 1.5 g via INTRAMUSCULAR
  Filled 2012-08-19: qty 1.5

## 2012-08-19 MED ORDER — HYDROMORPHONE HCL PF 1 MG/ML IJ SOLN: 0.2500 mg | INTRAMUSCULAR | Status: DC | PRN

## 2012-08-19 MED ORDER — METOCLOPRAMIDE HCL 5 MG/ML IJ SOLN: 10.0000 mg | Freq: Once | INTRAMUSCULAR | Status: DC | PRN

## 2012-08-19 MED ORDER — PROPOFOL 10 MG/ML IV BOLUS
INTRAVENOUS | Status: DC | PRN
  Administered 2012-08-19 (×5): 20 mg via INTRAVENOUS
  Administered 2012-08-19: 10 mg via INTRAVENOUS

## 2012-08-19 MED ORDER — VANCOMYCIN HCL 1000 MG IV SOLR
750.0000 mg | Freq: Once | INTRAVENOUS | Status: AC
  Administered 2012-08-19: 750 mg via INTRAVENOUS
  Filled 2012-08-19: qty 750

## 2012-08-19 MED ORDER — LIDOCAINE-EPINEPHRINE (PF) 1 %-1:200000 IJ SOLN
INTRAMUSCULAR | Status: AC
  Filled 2012-08-19: qty 10

## 2012-08-19 MED ORDER — HEPARIN SODIUM (PORCINE) 1000 UNIT/ML IJ SOLN
INTRAMUSCULAR | Status: AC
  Filled 2012-08-19: qty 1

## 2012-08-19 MED ORDER — FENTANYL CITRATE 0.05 MG/ML IJ SOLN: 25.0000 ug | INTRAMUSCULAR | Status: DC | PRN

## 2012-08-19 MED ORDER — ONDANSETRON HCL 4 MG/2ML IJ SOLN
INTRAMUSCULAR | Status: DC | PRN
  Administered 2012-08-19: 4 mg via INTRAVENOUS

## 2012-08-19 SURGICAL SUPPLY — 52 items
BAG DECANTER FOR FLEXI CONT (MISCELLANEOUS) ×2 IMPLANT
CATH BEACON 5.038 65CM KMP-01 (CATHETERS) ×2 IMPLANT
CATH CANNON HEMO 15F 50CM (CATHETERS) IMPLANT
CATH CANNON HEMO 15FR 19 (HEMODIALYSIS SUPPLIES) IMPLANT
CATH CANNON HEMO 15FR 23CM (HEMODIALYSIS SUPPLIES) IMPLANT
CATH CANNON HEMO 15FR 31CM (HEMODIALYSIS SUPPLIES) IMPLANT
CATH CANNON HEMO 15FR 32CM (HEMODIALYSIS SUPPLIES) ×2 IMPLANT
CATH STRAIGHT 5FR 65CM (CATHETERS) ×2 IMPLANT
CLOTH BEACON ORANGE TIMEOUT ST (SAFETY) ×2 IMPLANT
COVER PROBE W GEL 5X96 (DRAPES) ×2 IMPLANT
COVER SURGICAL LIGHT HANDLE (MISCELLANEOUS) ×2 IMPLANT
DERMABOND ADVANCED (GAUZE/BANDAGES/DRESSINGS) ×1
DERMABOND ADVANCED .7 DNX12 (GAUZE/BANDAGES/DRESSINGS) ×1 IMPLANT
DRAPE C-ARM 42X72 X-RAY (DRAPES) ×2 IMPLANT
DRAPE CHEST BREAST 15X10 FENES (DRAPES) ×2 IMPLANT
GAUZE SPONGE 2X2 8PLY STRL LF (GAUZE/BANDAGES/DRESSINGS) ×1 IMPLANT
GAUZE SPONGE 4X4 16PLY XRAY LF (GAUZE/BANDAGES/DRESSINGS) ×2 IMPLANT
GLOVE BIO SURGEON STRL SZ7 (GLOVE) ×2 IMPLANT
GLOVE BIOGEL PI IND STRL 6.5 (GLOVE) ×1 IMPLANT
GLOVE BIOGEL PI IND STRL 7.0 (GLOVE) ×1 IMPLANT
GLOVE BIOGEL PI IND STRL 7.5 (GLOVE) ×1 IMPLANT
GLOVE BIOGEL PI INDICATOR 6.5 (GLOVE) ×1
GLOVE BIOGEL PI INDICATOR 7.0 (GLOVE) ×1
GLOVE BIOGEL PI INDICATOR 7.5 (GLOVE) ×1
GLOVE ECLIPSE 6.5 STRL STRAW (GLOVE) ×2 IMPLANT
GOWN PREVENTION PLUS XXLARGE (GOWN DISPOSABLE) ×2 IMPLANT
GOWN STRL NON-REIN LRG LVL3 (GOWN DISPOSABLE) ×2 IMPLANT
KIT BASIN OR (CUSTOM PROCEDURE TRAY) ×2 IMPLANT
KIT ROOM TURNOVER OR (KITS) ×2 IMPLANT
NEEDLE 18GX1X1/2 (RX/OR ONLY) (NEEDLE) ×2 IMPLANT
NEEDLE HYPO 25GX1X1/2 BEV (NEEDLE) ×2 IMPLANT
NS IRRIG 1000ML POUR BTL (IV SOLUTION) ×2 IMPLANT
PACK SURGICAL SETUP 50X90 (CUSTOM PROCEDURE TRAY) ×2 IMPLANT
PAD ARMBOARD 7.5X6 YLW CONV (MISCELLANEOUS) ×4 IMPLANT
SET MICROPUNCTURE 5F STIFF (MISCELLANEOUS) IMPLANT
SHEATH AVANTI 11CM 5FR (MISCELLANEOUS) ×2 IMPLANT
SOAP 2 % CHG 4 OZ (WOUND CARE) ×2 IMPLANT
SPONGE GAUZE 2X2 STER 10/PKG (GAUZE/BANDAGES/DRESSINGS) ×1
SUT ETHILON 3 0 PS 1 (SUTURE) ×2 IMPLANT
SUT MNCRL AB 4-0 PS2 18 (SUTURE) ×2 IMPLANT
SYR 20CC LL (SYRINGE) ×6 IMPLANT
SYR 30ML LL (SYRINGE) IMPLANT
SYR 3ML LL SCALE MARK (SYRINGE) ×2 IMPLANT
SYR 5ML LL (SYRINGE) ×2 IMPLANT
SYR CONTROL 10ML LL (SYRINGE) ×2 IMPLANT
SYRINGE 10CC LL (SYRINGE) ×2 IMPLANT
TAPE CLOTH SURG 4X10 WHT LF (GAUZE/BANDAGES/DRESSINGS) ×2 IMPLANT
TOWEL OR 17X24 6PK STRL BLUE (TOWEL DISPOSABLE) ×2 IMPLANT
TOWEL OR 17X26 10 PK STRL BLUE (TOWEL DISPOSABLE) ×2 IMPLANT
WATER STERILE IRR 1000ML POUR (IV SOLUTION) ×2 IMPLANT
WIRE AMPLATZ SS-J .035X180CM (WIRE) ×4 IMPLANT
WIRE BENTSON .035X145CM (WIRE) ×2 IMPLANT

## 2012-08-19 NOTE — H&P (Signed)
[facility] [place]  Brief History and Physical  History of Present Illness  Lori Greer is a 42 y.o. female who presents with chief complaint: end stage renal disease.  The patient presents today for tunneled dialysis catheter placement.    Past Medical History  Diagnosis Date  . Bell's palsy 05/12/2008  . DIABETES MELLITUS, TYPE II 03/16/2008  . HYPERTENSION 01/06/2010  . OBESITY 03/16/2008  . Proliferative diabetic retinopathy(362.02) 04/23/2009  . Fibroid, uterine   . ESRD (end stage renal disease)     ESRD on HD  . Anemia   . CAD (coronary artery disease) 01/2012    a. Cardiac cath done for abn nuc 02/23/2012 - 2 vessel CAD s/p DES to mid-LAD, residual disease in the PL OM for medical therapy.   . End-stage renal disease on peritoneal dialysis 03/12/2012    PD cath placed in Mar 2014 and had tunneled HD cath placed in Jan 2014. Having problems as of Jun '14 with PD cath draining poorly .  PD cath repositioned from LUQ to pelvis by Dr Derrell Lolling on 07/07/12 by lap procedure.     . Shortness of breath     Past Surgical History  Procedure Laterality Date  . Appendectomy  2012  . Uterine fibroid surgery    . Abdominal hysterectomy    . Abcess drainage  2002    Right axilla  . Insertion of dialysis catheter  January 2014    Right IJ   . Capd insertion Left 04/08/2012    Procedure: LAPAROSCOPIC INSERTION CONTINUOUS AMBULATORY PERITONEAL DIALYSIS  (CAPD) CATHETER;  Surgeon: Axel Filler, MD;  Location: [facility] OR;  Service: General;  Laterality: Left;  . Laparoscopic repositioning capd catheter N/A 07/08/2012    Procedure:  DIAGNOSTIC LAPAROSCOPY LAPAROSCOPIC REPOSITIONING CAPD CATHETER;  Surgeon: Axel Filler, MD;  Location: [facility] OR;  Service: General;  Laterality: N/A;  . Laparoscopy N/A 07/14/2012    Procedure: LAPAROSCOPY DIAGNOSTIC WITH LYSIS OF ADHESIONS AND REPOSITIONING OF PERITONEAL DIALYSIS CATHETER;  Surgeon: Axel Filler, MD;  Location: [facility] OR;  Service:  General;  Laterality: N/A;  . Av fistula placement Left 07/16/2012    Procedure: ARTERIOVENOUS (AV) FISTULA CREATION BRACHIO-CEPHALIC;  Surgeon: Chuck Hint, MD;  Location: [facility];  Service: Vascular;  Laterality: Left;    History   Social History  . Marital Status: Married    Spouse Name: N/A    Number of Children: 5  . Years of Education: N/A   Occupational History  . CNA    Social History Main Topics  . Smoking status: Former Smoker    Quit date: 03/12/2004  . Smokeless tobacco: Never Used  . Alcohol Use: Yes     Comment: rarely  . Drug Use: No  . Sexually Active: Not Currently   Other Topics Concern  . Not on file   Social History Narrative   5 Children          Family History  Problem Relation Age of Onset  . Heart disease Mother     MI at age 18  . Diabetes Father   . Kidney failure Father   . Diabetes Sister     1 sister with diabtes  . Diabetes Brother     No current facility-administered medications on file prior to encounter.   Current Outpatient Prescriptions on File Prior to Encounter  Medication Sig Dispense Refill  . aspirin EC 81 MG tablet Take 81 mg by mouth daily.      [name]  bisacodyl (DULCOLAX) 5 MG EC tablet Take 5 mg by mouth daily as needed for constipation.      . calcitRIOL (ROCALTROL) 0.5 MCG capsule Take 1 mcg by mouth daily.      . clopidogrel (PLAVIX) 75 MG tablet Take 75 mg by mouth daily.      . folic acid-vitamin b complex-vitamin c-selenium-zinc (DIALYVITE) 3 MG TABS Take 1 tablet by mouth daily.      . metoprolol succinate (TOPROL-XL) 50 MG 24 hr tablet Take 50 mg by mouth daily.      . polyethylene glycol powder (GLYCOLAX/MIRALAX) powder Take 17 g by mouth daily.      . pravastatin (PRAVACHOL) 40 MG tablet Take 40 mg by mouth every evening.      . sevelamer (RENVELA) 800 MG tablet Take 1,600 mg by mouth 3 (three) times daily with meals. Takes 1600mg  with each meal, and 800mg  with each snack.        No Known  Allergies  Review of Systems: As listed above, otherwise negative.  Physical Examination  Filed Vitals:   08/18/12 1314 08/18/12 1636 08/18/12 1957 08/19/12 0356  BP: 115/64 129/68 129/68 117/53  Pulse: 81 83 89 79  Temp: 98.4 F (36.9 C) 98.5 F (36.9 C) 98.7 F (37.1 C) 98.6 F (37 C)  TempSrc:   Oral Oral  Resp: 17 17 18 18   Height:      Weight:   166 lb 14.2 oz (75.7 kg)   SpO2: 100% 100% 100% 100%    General: A&O x 3, WDWN  Pulmonary: Sym exp, good air movt, CTAB, no rales, rhonchi, & wheezing  Cardiac: RRR, Nl S1, S2, no Murmurs, rubs or gallops  Gastrointestinal: soft, NTND, -G/R, - HSM, - masses, - CVAT B  Musculoskeletal: M/S 5/5 throughout , Extremities without ischemic changes   Laboratory See [facility]  Medical Decision Making  Lori Greer is a 43 y.o. female who presents with: end stage renal disease.   The patient is scheduled for: tunneled dialysis catheter placement. The patient is aware the risks of tunneled dialysis catheter placement include but are not limited to: bleeding, infection, central venous injury, pneumothorax, possible venous stenosis, possible malpositioning in the venous system, and possible infections related to long-term catheter presence.   The patient is aware of the risks and agrees to proceed.  Leonides Sake, MD [facility] [place] Office: [phone] Pager: [phone]  08/19/2012, 9:17 AM

## 2012-08-19 NOTE — Procedures (Signed)
I was present at this dialysis session. I have reviewed the session itself and made appropriate changes.   Lori Moselle  MD Pager [phone]    Cell  [phone] 08/19/2012, 3:39 PM

## 2012-08-19 NOTE — Progress Notes (Signed)
CRITICAL VALUE ALERT  Critical value received:  Potassium 2.7.  Date of notification:  08/19/12  Time of notification:  08:45  Critical value read back:yes  Nurse who received alert:  Fayne Norrie  MD notified (1st page):  Dr. Arbutus Leas notified at 09:00. Per Dr. Arbutus Leas, pt to have hemodialysis today to address issue. No new orders given at this time. Patient currently in the O.R. Having HD catheter placed.

## 2012-08-19 NOTE — Progress Notes (Signed)
Pt arrived from the OR after having HD catheter placed. Denies any discomfort on assessment. Pt irritable and states that she is ready to go home. Will cont to monitor,

## 2012-08-19 NOTE — Progress Notes (Addendum)
[facility] PROGRESS NOTE  Lori Greer ZOX:[id number] DOB: 1970-11-30 DOA: 08/16/2012 PCP: Rogelia Boga, MD  Brief history 42 year old female with a history of ESRD on peritoneal dialysis presented with one week history , dizziness with standing, intermitten vomiting and abdominal pain. In ED, the patient was found to have a lactic acidosis with a lactate of 3.4. Blood glucose was 500 with anion gap of 22. Serum acetone was negative. The patient improved with subcutaneous insulin. Her hemoglobin A1c was noted to be 5.7. Her abdominal pain gradually improved but persisted despite treatment of her hyperglycemia. Although the patient's dialysate cell count was only 73, she continued to have intermittent abdominal pain with a number of complications since placement of her Tenckhoff catheter in March 2014. In addition, PD fluid grew yeast. The patient was started on fluconazole due to the reasons above and the fact that she continued to have cloudy dialysate. Hemodialysis catheter was placed for conversion to HD. Her Tenckhoff catheter removal is planned for 08/20/2012. Although the patient was initially started on Zosyn, this was discontinued. Her abdominal pain continued to improve and tolerated a renal diet. She currently waiting for an outpatient dialysis chair with which case management is assisting.  Assessment/Plan: Abdominal pain  -Overall improved but with occasional mild pain -CT abdomen without any bowel defects or extraluminal contrast  -May be related to hyperosmolar nonketotic state secondary to the patient's profound hyperglycemia--520 on admission  -Patient states abdominal pain markedly improved  -Patient requests advancing diet--> advanced to renal diet -->tolerating well -Lipase 20  -agree with D/C zosyn  -PD catheter placed March 2014  ? Fungal peritonitis? -Although the patient did not have significant pleocytosis in PD fluid (73 WBC--52%Neutrophils), pt  continues to have abdominal pain and cloudy dialysate.  -start patient on empiric fluconazole, 200mg /d, pending ID of yeast  -If yeast is C. glabrata or C. krusei, then will need to change to echinocandin.  -PD catheter removal on 08/20/2012.  -New HD catheter placed on 08/19/12 (Dr. Imogene Burn) -Blood cultures remain negative. Pt is afebrile and hemodynamically stable.  -fluconazole has >90% bioavailability-->can give it po. -await CLIP process for HD chair ? Corynebacterium peritonitis?  -Deferred to nephrology service  -PD fluid Cell count on July 15--->30  -IP/IV Vancomycin per nephrology  - HD catheter placed 08/19/2012  Diabetes mellitus type 2  -Hemoglobin A1c 5.7  -Acetone negative  -Patient states that she drank "regular" Coca[employer]a prior to ED arrival  -Continue NovoLog sliding scale for now  -Patient only intermittently uses insulin at home--> only used it once in the past week  -Will not need insulin at the time of discharge  Gap metabolic acidosis  -Partly a lactic acidosis--3.60 on 08/16/2012  -Recheck lactic acid 0.6  CKD stage V, on peritoneal dialysis  -Appreciate renal followup  Hypokalemia  -Replete judiciously po (could not tolerate IV replacement)  -Check magnesium--2.3  Hypertension  -Restart metoprolol succinate  Coronary artery disease  -DES to mid LAD 02/23/2012  -Restart aspirin and Plavix  Family Communication: Pt at beside  Disposition Plan: Home when medically stable  Antibiotics:  Zosyn 08/16/2012>>>08/17/12 Fluconazole 08/19/2012>>> Vancomycin 08/19/2012>>>      Procedures/Studies: Ct Abdomen Pelvis Wo Contrast  08/16/2012   *RADIOLOGY REPORT*  Clinical Data: Diabetic hypertensive patient with end-stage renal disease on peritoneal dialysis.  Feeling weak which became worse when she started peritoneal dialysis.  Intermittent abdominal pain. Question perforation?  Prior hysterectomy and appendectomy.  CT ABDOMEN AND PELVIS WITHOUT CONTRAST  Technique:  Multidetector CT imaging of the abdomen and pelvis was performed following the standard protocol without intravenous contrast.  Comparison: 07/18/2012 plain film examination.  12/18/2009 CT.  Findings: Peritoneal dialysis catheter enters from the left lower abdomen and the tip curled within the pelvis.  Fluid is noted throughout the abdomen pelvis.  Additionally, there is free intraperitoneal air most notable in the upper abdomen.  This amount of free air could conceivably be related to air introduced at the time of the peritoneal dialysis.  Bowel perforation as cause of free intraperitoneal air is not excluded. There is however, no discrete bowel defect or extraluminal contrast.  Surgical consultation and / or close follow-up imaging recommended.  Free air is more notable adjacent to the stomach.  Thickening of the third portion of the duodenum may represent under distension or mild inflammation.  Narrowing of portions of the colon (such as at the level of the hepatic flexure) may represent result of peristalsis limiting excluding underlying circumferential mass.  Clinical history states the patient is post appendectomy however, radiopaque material right abdomen has an appearance suggestive of the appendix.  Evaluation of solid abdominal viscera is limited by lack of IV contrast.  Taking this limitation into account no worrisome hepatic, splenic, pancreatic, adrenal or renal lesion.  No calcified gallstone.  Limited for evaluating for cholecystitis given the surrounding fluid.  Ultrasound can be obtained for further delineation if this is of concern.  No bony destructive lesion or CT evidence of disc space infection.  Cardiomegaly.  Suggestion of prominent coronary artery calcifications.  No abdominal aortic aneurysm.  Partial calcification left common iliac artery.  No worrisome adenopathy.  Lung bases clear.  IMPRESSION: Peritoneal dialysis catheter enters from the left lower abdomen and the tip curled within the  pelvis.  Fluid is noted throughout the abdomen pelvis.  Additionally, there is free intraperitoneal air most notable in the upper abdomen.  This amount of free air could conceivably be related to air introduced at the time of the peritoneal dialysis.  Bowel perforation as cause of free intraperitoneal air is not excluded. There is however, no discrete bowel defect or extraluminal contrast.  Surgical consultation and / or close follow-up imaging recommended depending on clinical suspicion.  Please see above discussion.  Critical Value/emergent results were called by telephone at the time of interpretation on 08/16/2012 at 8:00 a.m. to Dr. Dan Humphreys., who verbally acknowledged these results.   Original Report Authenticated By: Lacy Duverney, M.D.   Ir Removal Tun Cv Cath W/o Fl  08/16/2012   *RADIOLOGY REPORT*  Clinical Data/Indication: Peritoneal dialysis  TUNNEL CATHETER REMOVAL  Procedure: The procedure, risks, benefits, and alternatives were explained to the patient. Questions regarding the procedure were encouraged and answered. The patient understands and consents to the procedure.  The left chest was prepped with betadine in a sterile fashion, and a sterile drape was applied covering the operative field. A sterile gown and sterile gloves were used for the procedure.  1% lidocaine was utilized for local anesthesia. Utilizing blunt dissection, the cuff of the catheter was freed from the underlying subcutaneous tissue. The catheter was removed in its entirety. Hemostasis was achieved with direct pressure.  Complications: None.  IMPRESSION: Successful tunneled dialysis catheter removal.   Original Report Authenticated By: Jolaine Click, M.D.   Dg Chest Port 1 View  08/19/2012   *RADIOLOGY REPORT*  Clinical Data: Dialysis catheter placement  PORTABLE CHEST - 1 VIEW  Comparison: 08/17/2012  Findings: Left IJ approach dual lumen dialysis catheter  in place with tip over the right atrium.  Heart size upper limits of  normal. Curvilinear left midlung zone probable atelectasis noted.  No pleural effusion.  Heart size is normal.  Right upper quadrant pneumoperitoneum no longer visualized.  IMPRESSION: Left sided hemodialysis catheter tips over the right atrium.   Original Report Authenticated By: Christiana Pellant, M.D.   Dg Chest Portable 1 View  08/16/2012   *RADIOLOGY REPORT*  Clinical Data: Hypotension  PORTABLE CHEST - 1 VIEW  Comparison: Prior radiograph from 07/18/2012  Findings: A left-sided dual-lumen hemodialysis catheter is in place with tip overlying the mid SVC.  Cardiomegaly is not significantly changed as compared to prior exam.  Curvilinear lucency is seen overlying the dome of the liver, underneath the right hemidiaphragm, concerning for free intraperitoneal air.  The lungs are clear without airspace consolidation, pleural effusion, or pulmonary edema.  There is no pneumothorax on this semi erect portable exam.  IMPRESSION:  1.  Curvilinear lucency underneath the right hemidiaphragm, concerning for free intraperitoneal air.  This finding is of uncertain clinical etiology, and may be related to the presence of a peritoneal dialysis catheter.  Further evaluation with cross sectional imaging of the abdomen and pelvis could be performed as clinically indicated. 2.  Stable cardiomegaly without edema. 3.  No airspace consolidation to suggest acute infectious pneumonitis.  Critical Value/emergent results were called by telephone at the time of interpretation on 08/16/12 at 0520 am to Sunnie Nielsen by Dr. Hoy Morn, who verbally acknowledged these results.   Original Report Authenticated By: Rise Mu, M.D.   Dg Abd Acute W/chest  08/17/2012   *RADIOLOGY REPORT*  Clinical Data: 3 days of abdominal pain.  ACUTE ABDOMEN SERIES (ABDOMEN 2 VIEW & CHEST 1 VIEW)  Comparison: CT abdomen and pelvis from yesterday.  Findings: Pneumoperitoneum persists, located in the right diaphragm. There is also small volume sub  hepatic gas.  Bowel gas pattern is nonobstructed, with progression of positive enteric contrast through the colon. There are some fluid levels within the colon, nonspecific.  Chain sutures seen in the right lower quadrant.  Peritoneal dialysis catheter continues project over the low pelvis.  No abnormal intra-abdominal mass effect.  No acute osseous findings.  The lungs are clear.  No effusion or pneumothorax.  Normal heart size and mediastinal contours.  These results were called by telephone on 08/17/2012 at 22:35 to [facility] Benedetto Coons, who verbally acknowledged these results.  IMPRESSION:  1.  Persistent pneumoperitoneum, likely similar volume compared to yesterday.  The gas could originate from a peritoneal dialysis catheter or a hollow viscus perforation.   2. Nonobstructive bowel gas pattern.   Original Report Authenticated By: Tiburcio Pea   Dg Fluoro Guide Cv Line-no Report  08/19/2012   CLINICAL DATA: Renal Failure   FLOURO GUIDE CV LINE  Fluoroscopy was utilized by the requesting physician.  No radiographic  interpretation.          Subjective: Patient states that abdominal pain has significantly improved. Denies any fevers, chills, chest discomfort, shortness breath, nausea, vomiting, diarrhea. No headaches or visual disturbance.  Objective: Filed Vitals:   08/19/12 1558 08/19/12 1631 08/19/12 1657 08/19/12 1709  BP: 147/80 117/61 141/78 142/77  Pulse: 80 80 83 82  Temp:    98.2 F (36.8 C)  TempSrc:    Oral  Resp: 18 19 18 22   Height:      Weight:      SpO2:    100%    Intake/Output Summary (Last  24 hours) at 08/19/12 1733 Last data filed at 08/19/12 1709  Gross per 24 hour  Intake    540 ml  Output    625 ml  Net    -85 ml   Weight change: 1.1 kg (2 lb 6.8 oz) Exam:   General:  Pt is alert, follows commands appropriately, not in acute distress  HEENT: No icterus, No thrush,  [place]/AT  Cardiovascular: RRR, S1/S2, no rubs, no gallops  Respiratory: CTA bilaterally,  no wheezing, no crackles, no rhonchi  Abdomen: Soft/+BS, non tender, non distended, no guarding  Extremities: No edema, No lymphangitis, No petechiae, No rashes, no synovitis  Data Reviewed: Basic Metabolic Panel:  Recent Labs Lab 08/16/12 0400 08/16/12 0404 08/17/12 0748 08/18/12 0520 08/19/12 0742  NA 133* 134* 130* 133* 133*  K 2.5* 2.4* 2.5* 2.5* 2.7*  CL 88* 97 90* 97 98  CO2 23  --  23 21 18*  GLUCOSE 520* 526* 189* 100* 133*  BUN 45* 43* 41* 51* 73*  CREATININE 9.49* 9.10* 8.86* 10.20* 12.28*  CALCIUM 11.1*  --  9.0 8.0* 8.2*  MG 2.3  --   --   --   --   PHOS 7.5*  --  7.2*  --   --    Liver Function Tests:  Recent Labs Lab 08/16/12 0400 08/17/12 0748  AST 30  --   ALT <5  --   ALKPHOS 122*  --   BILITOT 0.3  --   PROT 9.6*  --   ALBUMIN 3.6 2.6*    Recent Labs Lab 08/16/12 0400  LIPASE 20   No results found for this basename: AMMONIA,  in the last 168 hours CBC:  Recent Labs Lab 08/16/12 0400 08/16/12 0404 08/17/12 0525 08/18/12 0520  WBC 10.9*  --  9.4 7.5  NEUTROABS  --   --  7.3  --   HGB 16.8* 18.4* 13.6 11.2*  HCT 47.1* 54.0* 37.1 31.6*  MCV 86.7  --  85.7 86.8  PLT 359  --  249 188   Cardiac Enzymes:  Recent Labs Lab 08/17/12 1407 08/17/12 1915 08/18/12 0112  TROPONINI <0.30 <0.30 <0.30   BNP: No components found with this basename: POCBNP,  CBG:  Recent Labs Lab 08/19/12 0353 08/19/12 0743 08/19/12 1058 08/19/12 1212 08/19/12 1723  GLUCAP 143* 129* 133* 127* 151*    Recent Results (from the past 240 hour(s))  BODY FLUID CULTURE     Status: None   Collection Time    08/16/12  4:26 AM      Result Value Range Status   Specimen Description PERITONEAL DIALYSATE   Final   Special Requests NONE   Final   Gram Stain     Final   Value: FEW WBC PRESENT, PREDOMINANTLY PMN     NO ORGANISMS SEEN   Culture RARE YEAST   Final   Report Status PENDING   Incomplete  CULTURE, BLOOD (ROUTINE X 2)     Status: None    Collection Time    08/16/12  3:35 PM      Result Value Range Status   Specimen Description BLOOD ARM RIGHT   Final   Special Requests BOTTLES DRAWN AEROBIC ONLY 5CC   Final   Culture  Setup Time 08/17/2012 00:40   Final   Culture     Final   Value:        BLOOD CULTURE RECEIVED NO GROWTH TO DATE CULTURE WILL BE HELD FOR 5 DAYS BEFORE  ISSUING A FINAL NEGATIVE REPORT   Report Status PENDING   Incomplete  CULTURE, BLOOD (ROUTINE X 2)     Status: None   Collection Time    08/16/12  4:04 PM      Result Value Range Status   Specimen Description BLOOD RIGHT ARM   Final   Special Requests BOTTLES DRAWN AEROBIC ONLY 5CC   Final   Culture  Setup Time 08/17/2012 00:44   Final   Culture     Final   Value:        BLOOD CULTURE RECEIVED NO GROWTH TO DATE CULTURE WILL BE HELD FOR 5 DAYS BEFORE ISSUING A FINAL NEGATIVE REPORT   Report Status PENDING   Incomplete  URINE CULTURE     Status: None   Collection Time    08/17/12 12:18 PM      Result Value Range Status   Specimen Description URINE, RANDOM   Final   Special Requests NONE   Final   Culture  Setup Time 08/17/2012 18:18   Final   Colony Count 40,000 COLONIES/ML   Final   Culture YEAST   Final   Report Status 08/18/2012 FINAL   Final  SURGICAL PCR SCREEN     Status: None   Collection Time    08/18/12 10:56 PM      Result Value Range Status   MRSA, PCR NEGATIVE  NEGATIVE Final   Staphylococcus aureus NEGATIVE  NEGATIVE Final   Comment:            The Xpert SA Assay (FDA     approved for NASAL specimens     in patients over 21 years of age),     is one component of     a comprehensive surveillance     program.  Test performance has     been validated by [employer] for patients greater     than or equal to 33 year old.     It is not intended     to diagnose infection nor to     guide or monitor treatment.     Scheduled Meds: . aspirin  81 mg Oral Daily  . clopidogrel  75 mg Oral Q breakfast  . dextrose      .  doxercalciferol  2 mcg Intravenous Q M,W,F-HD  . fluconazole  200 mg Oral Daily  . insulin aspart  0-9 Units Subcutaneous Q4H  . metoprolol succinate  25 mg Oral Daily  . multivitamin  1 tablet Oral Daily  . potassium chloride  30 mEq Oral Q6H  . sodium chloride  3 mL Intravenous Q12H  . vancomycin (VANCOCIN) 750 mg IVPB (Hemodialysis)  750 mg Intravenous Once   Continuous Infusions: . sodium chloride 20 mL/hr at 08/19/12 0925     Sharnay Cashion, DO  [facility] Pager [phone]  If 7PM-7AM, please contact night-coverage [email or web address] Password [facility] 08/19/2012, 5:33 PM   LOS: 3 days

## 2012-08-19 NOTE — Anesthesia Procedure Notes (Signed)
Procedure Name: MAC Date/Time: 08/19/2012 9:45 AM Performed by: Elon Alas Pre-anesthesia Checklist: Patient identified, Timeout performed, Emergency Drugs available, Suction available and Patient being monitored Patient Re-evaluated:Patient Re-evaluated prior to inductionOxygen Delivery Method: Simple face mask Intubation Type: IV induction Placement Confirmation: positive ETCO2 and breath sounds checked- equal and bilateral Dental Injury: Teeth and Oropharynx as per pre-operative assessment

## 2012-08-19 NOTE — Anesthesia Postprocedure Evaluation (Signed)
Anesthesia Post Note  Patient: Lori Greer  Procedure(s) Performed: Procedure(s) (LRB): INSERTION OF DIALYSIS CATHETER Internal Jugular (Left) VENOCAVAGRAM SUPERIOR SERIALOGRAM (Left)  Anesthesia type: MAC  Patient location: PACU  Post pain: Pain level controlled  Post assessment: Patient's Cardiovascular Status Stable  Last Vitals:  Filed Vitals:   08/19/12 1125  BP: 122/53  Pulse: 77  Temp:   Resp: 14    Post vital signs: Reviewed and stable  Level of consciousness: alert  Complications: No apparent anesthesia complications

## 2012-08-19 NOTE — Progress Notes (Signed)
Subjective:  Feeing better, no current complaints  Objective: Vital signs in last 24 hours: Temp:  [98.4 F (36.9 C)-98.7 F (37.1 C)] 98.6 F (37 C) (07/28 0356) Pulse Rate:  [79-89] 79 (07/28 0356) Resp:  [17-18] 18 (07/28 0356) BP: (115-129)/(53-90) 117/53 mmHg (07/28 0356) SpO2:  [97 %-100 %] 100 % (07/28 0356) Weight:  [75.7 kg (166 lb 14.2 oz)] 75.7 kg (166 lb 14.2 oz) (07/27 1957) Weight change: 1.1 kg (2 lb 6.8 oz)  Intake/Output from previous day: 07/27 0701 - 07/28 0700 In: 640 [P.O.:540; IV Piggyback:100] Out: -  Intake/Output this shift:   EXAM: General appearance:  Alert, in no apparent distress Resp:  CTA without rales, rhonchi, or wheezes Cardio:  RRR without murmur or rub GI:  + BS, soft and nontender Extremities:  No edema Access:  PD catheter @ LLQ, AVF @ LUA with + bruit  Lab Results:  Recent Labs  08/17/12 0525 08/18/12 0520  WBC 9.4 7.5  HGB 13.6 11.2*  HCT 37.1 31.6*  PLT 249 188   BMET:  Recent Labs  08/17/12 0748 08/18/12 0520  NA 130* 133*  K 2.5* 2.5*  CL 90* 97  CO2 23 21  GLUCOSE 189* 100*  BUN 41* 51*  CREATININE 8.86* 10.20*  CALCIUM 9.0 8.0*  ALBUMIN 2.6*  --    No results found for this basename: PTH,  in the last 72 hours Iron Studies: No results found for this basename: IRON, TIBC, TRANSFERRIN, FERRITIN,  in the last 72 hours  Dialysis Orders [facility])  [facility] 6 exchanges 6 fills; 2 L last fill 1L dwell 1:45 daytime exchange fill 2 l Epo 19K SQ 2x weekly; Venofer 100/week plus give 300 venofer 7/25; on Ancef 1 gm IP q daytime dwell through 8/5 = 3 weeks for + corynebacterium PD fluid cultures 7/16. Prior cell count 7/15 was 38 with 75% N, but pt had suboptimal specimen  Recent labs; hgb 9.3 7/3, ferritin 406 and 22% sat 7/3; iPTH 789 7/3   Assessment/Plan: 1. Failure of peritoneal dialysis- has new tunneled HD cath, will reconsult surg for PD cath removal prior to d/c 2. ESRD- HD today, no more  PD 3. Hypokalemia- replete po, 4K bath with HD 4. HTN/Volume - BP 117/53 on Metoprolol 25 mg qd; volume depletion resolving. Keep even with HD today 5. Anemia - Hgb 11.2.  CBC pending, start Aranesp as needed. 6. Secondary hyperparathyroidism - Ca 8 (9.1 corrected), P 7.2; Hectorol 2 mcg.  Needs binder when appetite improves. 7. Nutrition - Alb 2.6, renal diet, vitamin. 8. DM - per primary.   LOS: 3 days   LYLES,CHARLES 08/19/2012,7:42 AM  Patient seen and examined.  Agree with assessment and plan as above.  Has new HD cath now, needs PD cath removal and CLIP to outpatient prior to d/c. Have called gen surg office.  HD today [name]  MD Pager [phone]    Cell  [phone] 08/19/2012, 3:39 PM

## 2012-08-19 NOTE — Op Note (Signed)
OPERATIVE NOTE  PROCEDURE: 1. Left internal jugular vein tunneled dialysis catheter placement 2. Central venogram with Superior vena cavagram 3. Left internal jugular vein cannulation under ultrasound guidance  PRE-OPERATIVE DIAGNOSIS: end-stage renal failure  POST-OPERATIVE DIAGNOSIS: same as above  SURGEON: Leonides Sake, MD  ANESTHESIA: local and MAC  ESTIMATED BLOOD LOSS: 30 cc  FINDING(S): 1.  Tips of the catheter in the right atrium on fluoroscopy 2.  No obvious pneumothorax on fluoroscopy 3.  Wire in azygos/hemiazygos system 4.  Patent left innominate vein and superior vena cava  5.  Occluded right internal jugular vein   SPECIMEN(S):  none  INDICATIONS:   Lori Greer is a 42 y.o. female who presents with end stage renal disease.  The patient presents for tunneled dialysis catheter placement.  The patient is aware the risks of tunneled dialysis catheter placement include but are not limited to: bleeding, infection, central venous injury, pneumothorax, possible venous stenosis, possible malpositioning in the venous system, and possible infections related to long-term catheter presence.  The patient was aware of these risks and agreed to proceed.  DESCRIPTION: After written full informed consent was obtained from the patient, the patient was taken back to the operating room.  Prior to induction, the patient was given IV antibiotics.  After obtaining adequate sedation, the patient was prepped and draped in the standard fashion for a chest or neck tunneled dialysis catheter placement. Under ultrasound guidance, the right internal jugular vein was found to be occluded.  I located a compressible left internal jugular vein however.  I anesthesized the neck cannulation site with local anesthetic.  The left internal jugular vein was cannulated with the 18 gauge needle.  A J-wire was then placed down in the innominate vein which was making a right angle turn.  Despite multiple maneuvers,  the wire continued to tract into the azygos/hemiazygous system.  I exchanged the needle for a 5-Fr sheath.  I then exchanged the wire for a [employer] wire.  I placed a KMP over the wire and using this combination, the wire continued to track into the azygous vein.  I removed the wire and catheter and did a hand injection to image the central veins.  There appeared to be no anomalous anatomy.  I replaced the KMP with [facility] wire and steered in this patient's right ventricle under fluroscopic guidance.  The wire was exchanged for an Amplatz wire and the catheter was removed.  The wire was then secured in place with a clamp to the drapes.  The cannulation site, the catheter exit site, and tract for the subcutaneous tunnel were then anesthesized with a total of 30 cc of 1% Lidocaine with epinepherine.  I then made stab incisions at the neck and exit sites.  I dissected from the exit site to the cannulation site with a metal tunneler.   The subcutaneous tunnel was dilated by passing a plastic dilator over the metal dissector.  At this point, the sheath was removed.  Over the wire, the skin tract and venotomy was dilated serially with dilators.  Finally, the dilator-sheath was placed over the wire under fluroscopic guidance into the superior vena cava.  The dilator was removed.  A 27 cm Diatek catheter was woven over the wire and advanced under fluoroscopic guidance down into the right atrium.  The wire was then removed, and the sheath was broken and peeled away while holding the catheter cuff at the level of the skin.  The back end of this catheter  was transected, revealing the two lumens of this catheter.  The ports were docked onto these two lumens.  The catheter hub was then screwed into place.  Each port was tested by aspirating and flushing.  No resistance was noted.  Each port was then thoroughly flushed with heparinized saline.  The catheter was secured in placed with two interrupted stitches of 3-0 Nylon tied to the  catheter.  The neck incision was closed with a U-stitch of 4-0 Monocryl.  The neck and chest incisions were cleaned and sterile bandages applied.  Each port was then loaded with concentrated heparin (1000 Units/mL) at the manufacturer recommended volumes to each port.  Sterile caps were applied to each port.  On completion fluoroscopy, the tips of the catheter were in the right atrium, and there was no evidence of pneumothorax.  COMPLICATIONS: none  CONDITION: stable   Leonides Sake, MD [facility] Office: [phone] Pager: [phone]  08/19/2012, 10:43 AM

## 2012-08-19 NOTE — Progress Notes (Signed)
Day of Surgery  Subjective: Surgery has been re-consulted for PD catheter removal at the request of Dr. Arlean Hopping.  Peritoneal dialysate revealed yeast.   At present time, she is in dialysis. She underwent a left IJ dialysis catheter placement this morning.   States she is feeling tired, otherwise doing fine.  Denies fever, chills or sweats.  Complains of mild abdominal pain to palpation or movement.    Objective: Vital signs in last 24 hours: Temp:  [97 F (36.1 C)-99 F (37.2 C)] 99 F (37.2 C) (07/28 1351) Pulse Rate:  [71-89] 80 (07/28 1558) Resp:  [12-24] 18 (07/28 1558) BP: (114-147)/(51-80) 147/80 mmHg (07/28 1558) SpO2:  [99 %-100 %] 100 % (07/28 1215) Weight:  [166 lb 14.2 oz (75.7 kg)-167 lb 12.3 oz (76.1 kg)] 167 lb 12.3 oz (76.1 kg) (07/28 1351) Last BM Date: 08/18/12 (per pt report)  Intake/Output from previous day: 07/27 0701 - 07/28 0700 In: 640 [P.O.:540; IV Piggyback:100] Out: -  Intake/Output this shift: Total I/O In: 300 [I.V.:300] Out: 10 [Blood:10]  General appearance: alert, cooperative, appears stated age and no distress Resp: clear to auscultation bilaterally Cardio: regular rate and rhythm, S1, S2 normal, no murmur, click, rub or gallop GI: +BS abdomen is soft, round and diffuse mild tenderness to palpation.  Lt sided PD cath port Extremities: extremities normal, atraumatic, no cyanosis.  Trace pedal edema Left sided IJ catheter   Lab Results:   Recent Labs  08/17/12 0525 08/18/12 0520  WBC 9.4 7.5  HGB 13.6 11.2*  HCT 37.1 31.6*  PLT 249 188   BMET  Recent Labs  08/18/12 0520 08/19/12 0742  NA 133* 133*  K 2.5* 2.7*  CL 97 98  CO2 21 18*  GLUCOSE 100* 133*  BUN 51* 73*  CREATININE 10.20* 12.28*  CALCIUM 8.0* 8.2*   Studies/Results: Dg Chest Port 1 View  08/19/2012   *RADIOLOGY REPORT*  Clinical Data: Dialysis catheter placement  PORTABLE CHEST - 1 VIEW  Comparison: 08/17/2012  Findings: Left IJ approach dual lumen dialysis catheter  in place with tip over the right atrium.  Heart size upper limits of normal. Curvilinear left midlung zone probable atelectasis noted.  No pleural effusion.  Heart size is normal.  Right upper quadrant pneumoperitoneum no longer visualized.  IMPRESSION: Left sided hemodialysis catheter tips over the right atrium.   Original Report Authenticated By: Christiana Pellant, M.D.   Dg Abd Acute W/chest  08/17/2012   *RADIOLOGY REPORT*  Clinical Data: 3 days of abdominal pain.  ACUTE ABDOMEN SERIES (ABDOMEN 2 VIEW & CHEST 1 VIEW)  Comparison: CT abdomen and pelvis from yesterday.  Findings: Pneumoperitoneum persists, located in the right diaphragm. There is also small volume sub hepatic gas.  Bowel gas pattern is nonobstructed, with progression of positive enteric contrast through the colon. There are some fluid levels within the colon, nonspecific.  Chain sutures seen in the right lower quadrant.  Peritoneal dialysis catheter continues project over the low pelvis.  No abnormal intra-abdominal mass effect.  No acute osseous findings.  The lungs are clear.  No effusion or pneumothorax.  Normal heart size and mediastinal contours.  These results were called by telephone on 08/17/2012 at 22:35 to [facility] Benedetto Coons, who verbally acknowledged these results.  IMPRESSION:  1.  Persistent pneumoperitoneum, likely similar volume compared to yesterday.  The gas could originate from a peritoneal dialysis catheter or a hollow viscus perforation.   2. Nonobstructive bowel gas pattern.   Original Report Authenticated By: Christiane Ha  Watts    Anti-infectives: Anti-infectives   Start     Dose/Rate Route Frequency Ordered Stop   08/19/12 0940  cefUROXime (ZINACEF) 1.5 G injection    Comments:  LEE, HEATHER: cabinet override      08/19/12 0940 08/19/12 0946   08/19/12 0500  cefUROXime (ZINACEF) 1.5 g in dextrose 5 % 50 mL IVPB     1.5 g 100 mL/hr over 30 Minutes Intravenous 60 min pre-op 08/17/12 2052 08/19/12 0730   08/18/12 1830   fluconazole (DIFLUCAN) tablet 200 mg     200 mg Oral Daily 08/18/12 1733     08/17/12 2100  cefUROXime (ZINACEF) 1.5 g in dextrose 5 % 50 mL IVPB  Status:  Discontinued     1.5 g 100 mL/hr over 30 Minutes Intravenous 60 min pre-op 08/17/12 2047 08/17/12 2052   08/17/12 1500  vancomycin (VANCOCIN) 2,000 mg in sodium chloride 0.9 % 500 mL IVPB     2,000 mg 250 mL/hr over 120 Minutes Intravenous  Once 08/17/12 1452 08/17/12 1802   08/16/12 1500  piperacillin-tazobactam (ZOSYN) IVPB 2.25 g  Status:  Discontinued     2.25 g 100 mL/hr over 30 Minutes Intravenous Every 12 hours 08/16/12 1338 08/17/12 1259   08/16/12 0500  piperacillin-tazobactam (ZOSYN) IVPB 3.375 g     3.375 g 12.5 mL/hr over 240 Minutes Intravenous  Once 08/16/12 0453 08/16/12 0640      Assessment/Plan: 42 year old female with a history of DM, Anemia, HTN, hypokalemia which was supplemented today, CAD, ESRD, peritoneal dialysis failure,   -proceed with removal of PD catheter.  Risks of the procedure reviewed including but not limited to infection and bleeding.   -Obtain consent.   -NPO after midnight  -pre-op atbx   LOS: 3 days   Bonner Puna [facility] ANP-BC Pager [phone]  08/19/2012 4:23 PM

## 2012-08-19 NOTE — Anesthesia Preprocedure Evaluation (Signed)
Anesthesia Evaluation  Patient identified by MRN, date of birth, ID band Patient awake    Reviewed: Allergy & Precautions, H&P , NPO status , Patient's Chart, lab work & pertinent test results, reviewed documented beta blocker date and time   Airway Mallampati: II TM Distance: >3 FB Neck ROM: full    Dental   Pulmonary shortness of breath and with exertion,  breath sounds clear to auscultation        Cardiovascular hypertension, On Medications and On Home Beta Blockers + CAD and + Cardiac Stents Rhythm:regular     Neuro/Psych negative neurological ROS  negative psych ROS   GI/Hepatic negative GI ROS, Neg liver ROS,   Endo/Other  diabetes, Poorly Controlled, Type 2, Oral Hypoglycemic Agents  Renal/GU Dialysis and ESRFRenal disease  negative genitourinary   Musculoskeletal   Abdominal   Peds  Hematology negative hematology ROS (+)   Anesthesia Other Findings See surgeon's H&P   Reproductive/Obstetrics negative OB ROS                           Anesthesia Physical Anesthesia Plan  ASA: III  Anesthesia Plan: MAC   Post-op Pain Management:    Induction: Intravenous  Airway Management Planned: Simple Face Mask  Additional Equipment:   Intra-op Plan:   Post-operative Plan:   Informed Consent: I have reviewed the patients History and Physical, chart, labs and discussed the procedure including the risks, benefits and alternatives for the proposed anesthesia with the patient or authorized representative who has indicated his/her understanding and acceptance.   Dental Advisory Given  Plan Discussed with: CRNA and Surgeon  Anesthesia Plan Comments:         Anesthesia Quick Evaluation

## 2012-08-19 NOTE — Progress Notes (Signed)
PHARMACY NOTE  Pharmacy Consult for :  Vancomycin Indication:  Peritonitis  Hospital Problems Principal Problem:   Abdominal pain Active Problems:   Proliferative diabetic retinopathy   Hypertension   CAD (coronary artery disease)   End-stage renal disease on peritoneal dialysis   Secondary hyperparathyroidism (of renal origin)   DM type 2, uncontrolled, with renal complications   Lactic acidosis   Hypokalemia   Nausea and vomiting   Dehydration   Weight: 76 kg  Vitals: BP 117/61  Pulse 80  Temp(Src) 99 F (37.2 C) (Oral)  Resp 19  Ht 5\' 1"  (1.549 m)  Wt 167 lb 12.3 oz (76.1 kg)  BMI 31.72 kg/m2  SpO2 100%  Labs:  Recent Labs  08/17/12 0525 08/17/12 0748 08/18/12 0520 08/19/12 0742  WBC 9.4  --  7.5  --   HGB 13.6  --  11.2*  --   PLT 249  --  188  --   CREATININE  --  8.86* 10.20* 12.28*   Estimated Creatinine Clearance: 5.5 ml/min (by C-G formula based on Cr of 12.28).   Microbiology: Recent Results (from the past 720 hour(s))  BODY FLUID CULTURE     Status: None   Collection Time    08/16/12  4:26 AM      Result Value Range Status   Specimen Description PERITONEAL DIALYSATE   Final   Special Requests NONE   Final   Gram Stain     Final   Value: FEW WBC PRESENT, PREDOMINANTLY PMN     NO ORGANISMS SEEN   Culture RARE YEAST   Final   Report Status PENDING   Incomplete  CULTURE, BLOOD (ROUTINE X 2)     Status: None   Collection Time    08/16/12  3:35 PM      Result Value Range Status   Specimen Description BLOOD ARM RIGHT   Final   Special Requests BOTTLES DRAWN AEROBIC ONLY 5CC   Final   Culture  Setup Time 08/17/2012 00:40   Final   Culture     Final   Value:        BLOOD CULTURE RECEIVED NO GROWTH TO DATE CULTURE WILL BE HELD FOR 5 DAYS BEFORE ISSUING A FINAL NEGATIVE REPORT   Report Status PENDING   Incomplete  CULTURE, BLOOD (ROUTINE X 2)     Status: None   Collection Time    08/16/12  4:04 PM      Result Value Range Status    Specimen Description BLOOD RIGHT ARM   Final   Special Requests BOTTLES DRAWN AEROBIC ONLY 5CC   Final   Culture  Setup Time 08/17/2012 00:44   Final   Culture     Final   Value:        BLOOD CULTURE RECEIVED NO GROWTH TO DATE CULTURE WILL BE HELD FOR 5 DAYS BEFORE ISSUING A FINAL NEGATIVE REPORT   Report Status PENDING   Incomplete  URINE CULTURE     Status: None   Collection Time    08/17/12 12:18 PM      Result Value Range Status   Specimen Description URINE, RANDOM   Final   Special Requests NONE   Final   Culture  Setup Time 08/17/2012 18:18   Final   Colony Count 40,000 COLONIES/ML   Final   Culture YEAST   Final   Report Status 08/18/2012 FINAL   Final  SURGICAL PCR SCREEN     Status: None   Collection  Time    08/18/12 10:56 PM      Result Value Range Status   MRSA, PCR NEGATIVE  NEGATIVE Final   Staphylococcus aureus NEGATIVE  NEGATIVE Final   Comment:            The Xpert SA Assay (FDA     approved for NASAL specimens     in patients over 76 years of age),     is one component of     a comprehensive surveillance     program.  Test performance has     been validated by [employer] for patients greater     than or equal to 62 year old.     It is not intended     to diagnose infection nor to     guide or monitor treatment.    Anti-infectives Anti-infectives   Start     Dose/Rate Route Frequency Ordered Stop   08/19/12 1645  vancomycin (VANCOCIN) 750 mg in sodium chloride 0.9 % 150 mL IVPB     750 mg 150 mL/hr over 60 Minutes Intravenous  Once 08/19/12 1631     08/19/12 0940  cefUROXime (ZINACEF) 1.5 G injection    Comments:  LEE, HEATHER: cabinet override      08/19/12 0940 08/19/12 0946   08/19/12 0500  cefUROXime (ZINACEF) 1.5 g in dextrose 5 % 50 mL IVPB     1.5 g 100 mL/hr over 30 Minutes Intravenous 60 min pre-op 08/17/12 2052 08/19/12 0730   08/18/12 1830  fluconazole (DIFLUCAN) tablet 200 mg     200 mg Oral Daily 08/18/12 1733     08/17/12 2100   cefUROXime (ZINACEF) 1.5 g in dextrose 5 % 50 mL IVPB  Status:  Discontinued     1.5 g 100 mL/hr over 30 Minutes Intravenous 60 min pre-op 08/17/12 2047 08/17/12 2052   08/17/12 1500  vancomycin (VANCOCIN) 2,000 mg in sodium chloride 0.9 % 500 mL IVPB     2,000 mg 250 mL/hr over 120 Minutes Intravenous  Once 08/17/12 1452 08/17/12 1802   08/16/12 1500  piperacillin-tazobactam (ZOSYN) IVPB 2.25 g  Status:  Discontinued     2.25 g 100 mL/hr over 30 Minutes Intravenous Every 12 hours 08/16/12 1338 08/17/12 1259   08/16/12 0500  piperacillin-tazobactam (ZOSYN) IVPB 3.375 g     3.375 g 12.5 mL/hr over 240 Minutes Intravenous  Once 08/16/12 0453 08/16/12 0640      Assessment:  42 y/o female on Vancomycin for peritonitis.  Scheduled Vancomycin therapy through 08/29/12.  Patient had PD cath removed today and a temporary HD catheter place.  She is receiving a HD session today.  Vancomycin 2 gm was given 08/17/12, today is the first HD session since dose given.  Goal of Therapy:  Pre - HD Vancomycin levels 15 - 25 mcg/ml  Vancomycin dosed for HD schedule/requirements.  Plan:   Vancomycin 750 mg IV today, after HD.  Follow up HD schedule and redose as indicated.  Laurena Bering, Pharm.D.  08/19/2012 4:41 PM

## 2012-08-19 NOTE — Transfer of Care (Signed)
Immediate Anesthesia Transfer of Care Note  Patient: Lori Greer  Procedure(s) Performed: Procedure(s): INSERTION OF DIALYSIS CATHETER Internal Jugular (Left) VENOCAVAGRAM SUPERIOR SERIALOGRAM (Left)  Patient Location: PACU  Anesthesia Type:MAC  Level of Consciousness: awake, alert  and oriented  Airway & Oxygen Therapy: Patient Spontanous Breathing  Post-op Assessment: Report given to PACU RN, Post -op Vital signs reviewed and stable and Patient moving all extremities  Post vital signs: Reviewed and stable  Complications: No apparent anesthesia complications

## 2012-08-20 ENCOUNTER — Encounter (HOSPITAL_COMMUNITY)
Admission: EM | Disposition: A | Discharge status: Home / Self Care | Payer: Self-pay | Source: Home / Self Care | Attending: Internal Medicine

## 2012-08-20 ENCOUNTER — Encounter (HOSPITAL_COMMUNITY): Payer: Self-pay | Admitting: Anesthesiology

## 2012-08-20 ENCOUNTER — Encounter (HOSPITAL_COMMUNITY): Payer: Self-pay | Admitting: Vascular Surgery

## 2012-08-20 ENCOUNTER — Inpatient Hospital Stay (HOSPITAL_COMMUNITY): Payer: Medicare Other | Admitting: Anesthesiology

## 2012-08-20 DIAGNOSIS — Z4902 Encounter for fitting and adjustment of peritoneal dialysis catheter: Secondary | ICD-10-CM

## 2012-08-20 HISTORY — PX: CAPD REMOVAL: SHX5234

## 2012-08-20 LAB — GLUCOSE, CAPILLARY
Glucose-Capillary: 120 mg/dL — ABNORMAL HIGH (ref 70–99)
Glucose-Capillary: 137 mg/dL — ABNORMAL HIGH (ref 70–99)
Glucose-Capillary: 154 mg/dL — ABNORMAL HIGH (ref 70–99)
Glucose-Capillary: 160 mg/dL — ABNORMAL HIGH (ref 70–99)
Glucose-Capillary: 174 mg/dL — ABNORMAL HIGH (ref 70–99)
Glucose-Capillary: 197 mg/dL — ABNORMAL HIGH (ref 70–99)
Glucose-Capillary: 86 mg/dL (ref 70–99)
Glucose-Capillary: 93 mg/dL (ref 70–99)

## 2012-08-20 LAB — CBC
HCT: 28.8 % — ABNORMAL LOW (ref 36.0–46.0)
Hemoglobin: 10 g/dL — ABNORMAL LOW (ref 12.0–15.0)
MCH: 30.5 pg (ref 26.0–34.0)
MCHC: 34.7 g/dL (ref 30.0–36.0)
MCV: 87.8 fL (ref 78.0–100.0)
Platelets: 144 10*3/uL — ABNORMAL LOW (ref 150–400)
RBC: 3.28 MIL/uL — ABNORMAL LOW (ref 3.87–5.11)
RDW: 14.4 % (ref 11.5–15.5)
WBC: 6.1 10*3/uL (ref 4.0–10.5)

## 2012-08-20 LAB — RENAL FUNCTION PANEL
Albumin: 2 g/dL — ABNORMAL LOW (ref 3.5–5.2)
BUN: 51 mg/dL — ABNORMAL HIGH (ref 6–23)
CO2: 21 mEq/L (ref 19–32)
Calcium: 8.3 mg/dL — ABNORMAL LOW (ref 8.4–10.5)
Chloride: 101 mEq/L (ref 96–112)
Creatinine, Ser: 8.99 mg/dL — ABNORMAL HIGH (ref 0.50–1.10)
GFR calc Af Amer: 6 mL/min — ABNORMAL LOW (ref >90)
GFR calc non Af Amer: 5 mL/min — ABNORMAL LOW (ref >90)
Glucose, Bld: 104 mg/dL — ABNORMAL HIGH (ref 70–99)
Phosphorus: 5 mg/dL — ABNORMAL HIGH (ref 2.3–4.6)
Potassium: 3.4 mEq/L — ABNORMAL LOW (ref 3.5–5.1)
Sodium: 134 mEq/L — ABNORMAL LOW (ref 135–145)

## 2012-08-20 SURGERY — CONTINUOUS AMBULATORY PERITONEAL DIALYSIS  (CAPD) CATHETER REMOVAL
Anesthesia: General | Site: Abdomen | Wound class: Dirty or Infected

## 2012-08-20 MED ORDER — PENTAFLUOROPROP-TETRAFLUOROETH EX AERO: 1.0000 "application " | INHALATION_SPRAY | CUTANEOUS | Status: DC | PRN

## 2012-08-20 MED ORDER — VANCOMYCIN HCL 1000 MG IV SOLR
750.0000 mg | INTRAVENOUS | Status: DC
  Administered 2012-08-21: 750 mg via INTRAVENOUS
  Filled 2012-08-20 (×2): qty 750

## 2012-08-20 MED ORDER — SODIUM CHLORIDE 0.9 % IV SOLN: 100.0000 mL | INTRAVENOUS | Status: DC | PRN

## 2012-08-20 MED ORDER — HYDROMORPHONE HCL PF 1 MG/ML IJ SOLN: 0.2500 mg | INTRAMUSCULAR | Status: DC | PRN

## 2012-08-20 MED ORDER — LACTULOSE 10 GM/15ML PO SOLN
30.0000 g | Freq: Three times a day (TID) | ORAL | Status: DC
  Administered 2012-08-20 – 2012-08-21 (×3): 30 g via ORAL
  Filled 2012-08-20 (×6): qty 45

## 2012-08-20 MED ORDER — CLOPIDOGREL BISULFATE 75 MG PO TABS
75.0000 mg | ORAL_TABLET | Freq: Every day | ORAL | Status: DC
  Administered 2012-08-20: 75 mg via ORAL
  Filled 2012-08-20 (×2): qty 1

## 2012-08-20 MED ORDER — BUPIVACAINE-EPINEPHRINE 0.25% -1:200000 IJ SOLN
INTRAMUSCULAR | Status: DC | PRN
  Administered 2012-08-20: 10 mL

## 2012-08-20 MED ORDER — OXYCODONE HCL 5 MG/5ML PO SOLN: 5.0000 mg | Freq: Once | ORAL | Status: DC | PRN

## 2012-08-20 MED ORDER — ONDANSETRON HCL 4 MG/2ML IJ SOLN
INTRAMUSCULAR | Status: DC | PRN
  Administered 2012-08-20: 4 mg via INTRAVENOUS

## 2012-08-20 MED ORDER — SODIUM CHLORIDE 0.9 % IV SOLN: INTRAVENOUS | Status: DC

## 2012-08-20 MED ORDER — ALTEPLASE 2 MG IJ SOLR
2.0000 mg | Freq: Once | INTRAMUSCULAR | Status: AC | PRN
  Filled 2012-08-20: qty 2

## 2012-08-20 MED ORDER — ONDANSETRON HCL 4 MG/2ML IJ SOLN: 4.0000 mg | Freq: Once | INTRAMUSCULAR | Status: DC | PRN

## 2012-08-20 MED ORDER — FLUCONAZOLE IN SODIUM CHLORIDE 200-0.9 MG/100ML-% IV SOLN
200.0000 mg | INTRAVENOUS | Status: DC
  Administered 2012-08-20: 200 mg via INTRAVENOUS
  Filled 2012-08-20 (×2): qty 100

## 2012-08-20 MED ORDER — LIDOCAINE HCL (CARDIAC) 20 MG/ML IV SOLN
INTRAVENOUS | Status: DC | PRN
  Administered 2012-08-20: 80 mg via INTRAVENOUS

## 2012-08-20 MED ORDER — MIDAZOLAM HCL 5 MG/5ML IJ SOLN
INTRAMUSCULAR | Status: DC | PRN
  Administered 2012-08-20: 2 mg via INTRAVENOUS

## 2012-08-20 MED ORDER — OXYCODONE HCL 5 MG PO TABS
5.0000 mg | ORAL_TABLET | ORAL | Status: DC | PRN
  Administered 2012-08-20: 5 mg via ORAL
  Filled 2012-08-20: qty 1

## 2012-08-20 MED ORDER — LIDOCAINE HCL (PF) 1 % IJ SOLN: 5.0000 mL | INTRAMUSCULAR | Status: DC | PRN

## 2012-08-20 MED ORDER — PROPOFOL 10 MG/ML IV BOLUS
INTRAVENOUS | Status: DC | PRN
  Administered 2012-08-20: 200 mg via INTRAVENOUS

## 2012-08-20 MED ORDER — OXYCODONE HCL 5 MG PO TABS: 5.0000 mg | ORAL_TABLET | Freq: Once | ORAL | Status: DC | PRN

## 2012-08-20 MED ORDER — NEPRO/CARBSTEADY PO LIQD: 237.0000 mL | ORAL | Status: DC | PRN

## 2012-08-20 MED ORDER — FENTANYL CITRATE 0.05 MG/ML IJ SOLN
INTRAMUSCULAR | Status: DC | PRN
  Administered 2012-08-20: 50 ug via INTRAVENOUS

## 2012-08-20 MED ORDER — PHENYLEPHRINE HCL 10 MG/ML IJ SOLN
INTRAMUSCULAR | Status: DC | PRN
  Administered 2012-08-20 (×3): 80 ug via INTRAVENOUS

## 2012-08-20 MED ORDER — BUPIVACAINE-EPINEPHRINE PF 0.25-1:200000 % IJ SOLN
INTRAMUSCULAR | Status: AC
  Filled 2012-08-20: qty 30

## 2012-08-20 MED ORDER — HEPARIN SODIUM (PORCINE) 1000 UNIT/ML DIALYSIS
1000.0000 [IU] | INTRAMUSCULAR | Status: DC | PRN
  Filled 2012-08-20: qty 1

## 2012-08-20 MED ORDER — LIDOCAINE-PRILOCAINE 2.5-2.5 % EX CREA: 1.0000 "application " | TOPICAL_CREAM | CUTANEOUS | Status: DC | PRN

## 2012-08-20 SURGICAL SUPPLY — 42 items
BLADE SURG 15 STRL LF DISP TIS (BLADE) ×1 IMPLANT
BLADE SURG 15 STRL SS (BLADE) ×1
BLADE SURG ROTATE 9660 (MISCELLANEOUS) IMPLANT
CANISTER SUCTION 2500CC (MISCELLANEOUS) ×2 IMPLANT
CHLORAPREP W/TINT 26ML (MISCELLANEOUS) ×2 IMPLANT
CLOTH BEACON ORANGE TIMEOUT ST (SAFETY) ×2 IMPLANT
COVER SURGICAL LIGHT HANDLE (MISCELLANEOUS) ×2 IMPLANT
DECANTER SPIKE VIAL GLASS SM (MISCELLANEOUS) IMPLANT
DERMABOND ADHESIVE PROPEN (GAUZE/BANDAGES/DRESSINGS) ×1
DERMABOND ADVANCED .7 DNX6 (GAUZE/BANDAGES/DRESSINGS) ×1 IMPLANT
DRAPE LAPAROTOMY T 102X78X121 (DRAPES) ×2 IMPLANT
DRSG TEGADERM 4X4.75 (GAUZE/BANDAGES/DRESSINGS) ×2 IMPLANT
ELECT CAUTERY BLADE 6.4 (BLADE) ×2 IMPLANT
ELECT REM PT RETURN 9FT ADLT (ELECTROSURGICAL) ×2
ELECTRODE REM PT RTRN 9FT ADLT (ELECTROSURGICAL) ×1 IMPLANT
GAUZE SPONGE 2X2 8PLY STRL LF (GAUZE/BANDAGES/DRESSINGS) ×1 IMPLANT
GAUZE SPONGE 4X4 16PLY XRAY LF (GAUZE/BANDAGES/DRESSINGS) ×2 IMPLANT
GLOVE BIO SURGEON STRL SZ 6.5 (GLOVE) ×2 IMPLANT
GLOVE BIOGEL PI IND STRL 8 (GLOVE) ×1 IMPLANT
GLOVE BIOGEL PI INDICATOR 8 (GLOVE) ×1
GLOVE ECLIPSE 8.0 STRL XLNG CF (GLOVE) ×2 IMPLANT
GOWN STRL NON-REIN LRG LVL3 (GOWN DISPOSABLE) IMPLANT
GOWN STRL REIN XL XLG (GOWN DISPOSABLE) IMPLANT
KIT BASIN OR (CUSTOM PROCEDURE TRAY) ×2 IMPLANT
KIT ROOM TURNOVER OR (KITS) ×2 IMPLANT
NEEDLE 22X1 1/2 (OR ONLY) (NEEDLE) ×2 IMPLANT
NS IRRIG 1000ML POUR BTL (IV SOLUTION) ×2 IMPLANT
PACK SURGICAL SETUP 50X90 (CUSTOM PROCEDURE TRAY) ×2 IMPLANT
PAD ARMBOARD 7.5X6 YLW CONV (MISCELLANEOUS) ×4 IMPLANT
PENCIL BUTTON HOLSTER BLD 10FT (ELECTRODE) ×2 IMPLANT
SPONGE GAUZE 2X2 STER 10/PKG (GAUZE/BANDAGES/DRESSINGS) ×1
SUT MNCRL AB 4-0 PS2 18 (SUTURE) ×2 IMPLANT
SUT VICRYL 0 UR6 27IN ABS (SUTURE) ×2 IMPLANT
SWAB COLLECTION DEVICE MRSA (MISCELLANEOUS) IMPLANT
SYR BULB 3OZ (MISCELLANEOUS) ×2 IMPLANT
SYR CONTROL 10ML LL (SYRINGE) ×2 IMPLANT
TOWEL OR 17X24 6PK STRL BLUE (TOWEL DISPOSABLE) ×2 IMPLANT
TOWEL OR 17X26 10 PK STRL BLUE (TOWEL DISPOSABLE) ×2 IMPLANT
TUBE ANAEROBIC SPECIMEN COL (MISCELLANEOUS) IMPLANT
TUBE CONNECTING 12X1/4 (SUCTIONS) ×2 IMPLANT
WATER STERILE IRR 1000ML POUR (IV SOLUTION) IMPLANT
YANKAUER SUCT BULB TIP NO VENT (SUCTIONS) ×2 IMPLANT

## 2012-08-20 NOTE — Anesthesia Preprocedure Evaluation (Addendum)
Anesthesia Evaluation  Patient identified by MRN, date of birth, ID band Patient awake    Reviewed: Allergy & Precautions, H&P , NPO status , Patient's Chart, lab work & pertinent test results, reviewed documented beta blocker date and time   Airway Mallampati: I TM Distance: >3 FB Neck ROM: Full    Dental  (+) Dental Advisory Given, Edentulous Upper and Edentulous Lower   Pulmonary shortness of breath,  breath sounds clear to auscultation        Cardiovascular hypertension, Pt. on home beta blockers and Pt. on medications + CAD Rhythm:Regular Rate:Normal     Neuro/Psych    GI/Hepatic   Endo/Other  diabetes  Renal/GU ESRF and DialysisRenal disease     Musculoskeletal   Abdominal   Peds  Hematology   Anesthesia Other Findings   Reproductive/Obstetrics                          Anesthesia Physical Anesthesia Plan  ASA: III  Anesthesia Plan: General   Post-op Pain Management:    Induction: Intravenous  Airway Management Planned: Oral ETT  Additional Equipment:   Intra-op Plan:   Post-operative Plan: Extubation in OR  Informed Consent: I have reviewed the patients History and Physical, chart, labs and discussed the procedure including the risks, benefits and alternatives for the proposed anesthesia with the patient or authorized representative who has indicated his/her understanding and acceptance.   Dental advisory given  Plan Discussed with: Anesthesiologist, Surgeon and CRNA  Anesthesia Plan Comments:        Anesthesia Quick Evaluation

## 2012-08-20 NOTE — Transfer of Care (Signed)
Immediate Anesthesia Transfer of Care Note  Patient: Lori Greer  Procedure(s) Performed: Procedure(s): CONTINUOUS AMBULATORY PERITONEAL DIALYSIS  (CAPD) CATHETER REMOVAL (N/A)  Patient Location: PACU  Anesthesia Type:General  Level of Consciousness: awake, alert  and oriented  Airway & Oxygen Therapy: Patient Spontanous Breathing and Patient connected to nasal cannula oxygen  Post-op Assessment: Report given to PACU RN, Post -op Vital signs reviewed and stable and Patient moving all extremities X 4  Post vital signs: Reviewed and stable  Complications: No apparent anesthesia complications

## 2012-08-20 NOTE — Progress Notes (Addendum)
[facility] PROGRESS NOTE  Lori Greer FAO:[id number] DOB: 1970/04/11 DOA: 08/16/2012 PCP: Rogelia Boga, MD  Brief history  42 year old female with a history of ESRD on peritoneal dialysis presented with one week history , dizziness with standing, intermitten vomiting and abdominal pain. In ED, the patient was found to have a lactic acidosis with a lactate of 3.4. Blood glucose was 500 with anion gap of 22. Serum acetone was negative. The patient improved with subcutaneous insulin. Her hemoglobin A1c was noted to be 5.7. Her abdominal pain gradually improved but persisted despite treatment of her hyperglycemia. Although the patient's dialysate cell count was only 73, she continued to have intermittent abdominal pain with a number of complications since placement of her Tenckhoff catheter in March 2014. In addition, PD fluid grew yeast. The patient was started on fluconazole due to the reasons above and the fact that she continued to have cloudy dialysate. Hemodialysis catheter was placed for conversion to HD. Her Tenckhoff catheter removed on 08/20/2012. Although the patient was initially started on Zosyn, this was discontinued. Her abdominal pain continued to improve and tolerated a renal diet. She currently waiting for an outpatient dialysis chair with which case management is assisting.  Assessment/Plan:  Abdominal pain  -Overall improved but with occasional mild pain  -CT abdomen without any bowel defects or extraluminal contrast  -May be related to hyperosmolar nonketotic state secondary to the patient's profound hyperglycemia--520 on admission  -Patient states abdominal pain markedly improved  -Patient requests advancing diet--> advanced to renal diet -->tolerating well  -Lipase 20  -agree with D/C zosyn  -PD catheter placed March 2014  ? Fungal peritonitis?  -Although the patient did not have significant pleocytosis in PD fluid (73 WBC--52%Neutrophils), pt continues to  have abdominal pain and cloudy dialysate.  -start patient on empiric fluconazole, 200mg /d, pending ID of yeast  -If yeast is C. glabrata or C. krusei, then will need to change to echinocandin.  If C. Albicans, C. Tropicalis, or C. Parapsilosis-->fluconazole. -plan 4 weeks of antifungal therapy--started 08/18/12 -PD catheter removal on 08/20/2012.  -New HD catheter placed on 08/19/12 (Dr. Imogene Burn)  -Blood cultures remain negative. Pt is afebrile and hemodynamically stable. -fluconazole has >90% bioavailability-->can give it po.  -await CLIP process for HD chair  ? Corynebacterium peritonitis?  -Deferred to nephrology service  -PD fluid Cell count on July 15--->30  -IP/IV Vancomycin per nephrology  - HD catheter placed 08/19/2012  Diabetes mellitus type 2  -Hemoglobin A1c 5.7  -Acetone negative  -Patient states that she drank "regular" Coca-Cola prior to ED arrival  -Continue NovoLog sliding scale for now  -Patient only intermittently uses insulin at home--> only used it once in the past week  -Will not need insulin at the time of discharge  Gap metabolic acidosis  -Partly a lactic acidosis--3.60 on 08/16/2012  -Recheck lactic acid 0.6  CKD stage V, on peritoneal dialysis  -Appreciate renal followup  Hypokalemia  -Replete judiciously po (could not tolerate IV replacement)  -Check magnesium--2.3  Hypertension  -Restart metoprolol succinate  Coronary artery disease  -DES to mid LAD 02/23/2012  -Restart aspirin and Plavix  Family Communication: Pt at beside  Disposition Plan: Home when medically stable  Antibiotics:  Zosyn 08/16/2012>>>08/17/12  Fluconazole 08/18/2012>>>  Vancomycin 08/19/2012>>>      Procedures/Studies: Ct Abdomen Pelvis Wo Contrast  08/16/2012   *RADIOLOGY REPORT*  Clinical Data: Diabetic hypertensive patient with end-stage renal disease on peritoneal dialysis.  Feeling weak which became worse when she started peritoneal dialysis.  Intermittent abdominal pain.  Question perforation?  Prior hysterectomy and appendectomy.  CT ABDOMEN AND PELVIS WITHOUT CONTRAST  Technique:  Multidetector CT imaging of the abdomen and pelvis was performed following the standard protocol without intravenous contrast.  Comparison: 07/18/2012 plain film examination.  12/18/2009 CT.  Findings: Peritoneal dialysis catheter enters from the left lower abdomen and the tip curled within the pelvis.  Fluid is noted throughout the abdomen pelvis.  Additionally, there is free intraperitoneal air most notable in the upper abdomen.  This amount of free air could conceivably be related to air introduced at the time of the peritoneal dialysis.  Bowel perforation as cause of free intraperitoneal air is not excluded. There is however, no discrete bowel defect or extraluminal contrast.  Surgical consultation and / or close follow-up imaging recommended.  Free air is more notable adjacent to the stomach.  Thickening of the third portion of the duodenum may represent under distension or mild inflammation.  Narrowing of portions of the colon (such as at the level of the hepatic flexure) may represent result of peristalsis limiting excluding underlying circumferential mass.  Clinical history states the patient is post appendectomy however, radiopaque material right abdomen has an appearance suggestive of the appendix.  Evaluation of solid abdominal viscera is limited by lack of IV contrast.  Taking this limitation into account no worrisome hepatic, splenic, pancreatic, adrenal or renal lesion.  No calcified gallstone.  Limited for evaluating for cholecystitis given the surrounding fluid.  Ultrasound can be obtained for further delineation if this is of concern.  No bony destructive lesion or CT evidence of disc space infection.  Cardiomegaly.  Suggestion of prominent coronary artery calcifications.  No abdominal aortic aneurysm.  Partial calcification left common iliac artery.  No worrisome adenopathy.  Lung bases  clear.  IMPRESSION: Peritoneal dialysis catheter enters from the left lower abdomen and the tip curled within the pelvis.  Fluid is noted throughout the abdomen pelvis.  Additionally, there is free intraperitoneal air most notable in the upper abdomen.  This amount of free air could conceivably be related to air introduced at the time of the peritoneal dialysis.  Bowel perforation as cause of free intraperitoneal air is not excluded. There is however, no discrete bowel defect or extraluminal contrast.  Surgical consultation and / or close follow-up imaging recommended depending on clinical suspicion.  Please see above discussion.  Critical Value/emergent results were called by telephone at the time of interpretation on 08/16/2012 at 8:00 a.m. to Dr. Dan Humphreys., who verbally acknowledged these results.   Original Report Authenticated By: Lacy Duverney, M.D.   Dg Abd 1 View  08/19/2012   *RADIOLOGY REPORT*  Clinical Data: Peritoneal dialysis catheter placement  ABDOMEN - 1 VIEW  Comparison: August 17, 2012  Findings: Catheter is positioned in the mid pelvis.  There is moderate stool throughout the colon.  Bowel gas pattern is normal.  No obstruction or free air is seen on this supine examination.  There are no abnormal calcifications.  IMPRESSION: Catheter is positioned in the mid pelvis.  No obstruction or free air is seen on this supine examination.  Note that pneumoperitoneum was only seen on the upright view 2 days previously.   Original Report Authenticated By: Bretta Bang, M.D.   Ir Removal Tun Cv Cath W/o Fl  08/16/2012   *RADIOLOGY REPORT*  Clinical Data/Indication: Peritoneal dialysis  TUNNEL CATHETER REMOVAL  Procedure: The procedure, risks, benefits, and alternatives were explained to the patient. Questions regarding the procedure were encouraged  and answered. The patient understands and consents to the procedure.  The left chest was prepped with betadine in a sterile fashion, and a sterile drape was  applied covering the operative field. A sterile gown and sterile gloves were used for the procedure.  1% lidocaine was utilized for local anesthesia. Utilizing blunt dissection, the cuff of the catheter was freed from the underlying subcutaneous tissue. The catheter was removed in its entirety. Hemostasis was achieved with direct pressure.  Complications: None.  IMPRESSION: Successful tunneled dialysis catheter removal.   Original Report Authenticated By: Jolaine Click, M.D.   Dg Chest Port 1 View  08/19/2012   *RADIOLOGY REPORT*  Clinical Data: Dialysis catheter placement  PORTABLE CHEST - 1 VIEW  Comparison: 08/17/2012  Findings: Left IJ approach dual lumen dialysis catheter in place with tip over the right atrium.  Heart size upper limits of normal. Curvilinear left midlung zone probable atelectasis noted.  No pleural effusion.  Heart size is normal.  Right upper quadrant pneumoperitoneum no longer visualized.  IMPRESSION: Left sided hemodialysis catheter tips over the right atrium.   Original Report Authenticated By: Christiana Pellant, M.D.   Dg Chest Portable 1 View  08/16/2012   *RADIOLOGY REPORT*  Clinical Data: Hypotension  PORTABLE CHEST - 1 VIEW  Comparison: Prior radiograph from 07/18/2012  Findings: A left-sided dual-lumen hemodialysis catheter is in place with tip overlying the mid SVC.  Cardiomegaly is not significantly changed as compared to prior exam.  Curvilinear lucency is seen overlying the dome of the liver, underneath the right hemidiaphragm, concerning for free intraperitoneal air.  The lungs are clear without airspace consolidation, pleural effusion, or pulmonary edema.  There is no pneumothorax on this semi erect portable exam.  IMPRESSION:  1.  Curvilinear lucency underneath the right hemidiaphragm, concerning for free intraperitoneal air.  This finding is of uncertain clinical etiology, and may be related to the presence of a peritoneal dialysis catheter.  Further evaluation with cross  sectional imaging of the abdomen and pelvis could be performed as clinically indicated. 2.  Stable cardiomegaly without edema. 3.  No airspace consolidation to suggest acute infectious pneumonitis.  Critical Value/emergent results were called by telephone at the time of interpretation on 08/16/12 at 0520 am to Sunnie Nielsen by Dr. Hoy Morn, who verbally acknowledged these results.   Original Report Authenticated By: Rise Mu, M.D.   Dg Abd Acute W/chest  08/17/2012   *RADIOLOGY REPORT*  Clinical Data: 3 days of abdominal pain.  ACUTE ABDOMEN SERIES (ABDOMEN 2 VIEW & CHEST 1 VIEW)  Comparison: CT abdomen and pelvis from yesterday.  Findings: Pneumoperitoneum persists, located in the right diaphragm. There is also small volume sub hepatic gas.  Bowel gas pattern is nonobstructed, with progression of positive enteric contrast through the colon. There are some fluid levels within the colon, nonspecific.  Chain sutures seen in the right lower quadrant.  Peritoneal dialysis catheter continues project over the low pelvis.  No abnormal intra-abdominal mass effect.  No acute osseous findings.  The lungs are clear.  No effusion or pneumothorax.  Normal heart size and mediastinal contours.  These results were called by telephone on 08/17/2012 at 22:35 to [facility] Benedetto Coons, who verbally acknowledged these results.  IMPRESSION:  1.  Persistent pneumoperitoneum, likely similar volume compared to yesterday.  The gas could originate from a peritoneal dialysis catheter or a hollow viscus perforation.   2. Nonobstructive bowel gas pattern.   Original Report Authenticated By: Tiburcio Pea   Dg Fluoro Guide  Cv Line-no Report  08/19/2012   CLINICAL DATA: Renal Failure   FLOURO GUIDE CV LINE  Fluoroscopy was utilized by the requesting physician.  No radiographic  interpretation.          Subjective: Overall, patient states abdominal pain continues to improve. Denies fevers, chills, chest pain, shortness  breath, nausea, vomiting, diarrhea.  Objective: Filed Vitals:   08/20/12 1355 08/20/12 1407 08/20/12 1500 08/20/12 1810  BP: 116/65  128/72 132/68  Pulse:   86 88  Temp: 97.8 F (36.6 C) 97.8 F (36.6 C) 97.7 F (36.5 C) 98 F (36.7 C)  TempSrc:   Oral Oral  Resp:   18 18  Height:      Weight:      SpO2:   98% 98%    Intake/Output Summary (Last 24 hours) at 08/20/12 1937 Last data filed at 08/20/12 1846  Gross per 24 hour  Intake    760 ml  Output     10 ml  Net    750 ml   Weight change: 0.4 kg (14.1 oz) Exam:   General:  Pt is alert, follows commands appropriately, not in acute distress  HEENT: No icterus, No thrush,[place]/AT  Cardiovascular: RRR, S1/S2, no rubs, no gallops  Respiratory: CTA bilaterally, no wheezing, no crackles, no rhonchi  Abdomen: Soft/+BS, non tender, non distended, no guarding  Extremities: No edema, No lymphangitis, No petechiae, No rashes, no synovitis  Data Reviewed: Basic Metabolic Panel:  Recent Labs Lab 08/16/12 0400 08/16/12 0404 08/17/12 0748 08/18/12 0520 08/19/12 0742 08/20/12 0944  NA 133* 134* 130* 133* 133* 134*  K 2.5* 2.4* 2.5* 2.5* 2.7* 3.4*  CL 88* 97 90* 97 98 101  CO2 23  --  23 21 18* 21  GLUCOSE 520* 526* 189* 100* 133* 104*  BUN 45* 43* 41* 51* 73* 51*  CREATININE 9.49* 9.10* 8.86* 10.20* 12.28* 8.99*  CALCIUM 11.1*  --  9.0 8.0* 8.2* 8.3*  MG 2.3  --   --   --   --   --   PHOS 7.5*  --  7.2*  --   --  5.0*   Liver Function Tests:  Recent Labs Lab 08/16/12 0400 08/17/12 0748 08/20/12 0944  AST 30  --   --   ALT <5  --   --   ALKPHOS 122*  --   --   BILITOT 0.3  --   --   PROT 9.6*  --   --   ALBUMIN 3.6 2.6* 2.0*    Recent Labs Lab 08/16/12 0400  LIPASE 20   No results found for this basename: AMMONIA,  in the last 168 hours CBC:  Recent Labs Lab 08/16/12 0400 08/16/12 0404 08/17/12 0525 08/18/12 0520 08/20/12 0944  WBC 10.9*  --  9.4 7.5 6.1  NEUTROABS  --   --  7.3  --   --    HGB 16.8* 18.4* 13.6 11.2* 10.0*  HCT 47.1* 54.0* 37.1 31.6* 28.8*  MCV 86.7  --  85.7 86.8 87.8  PLT 359  --  249 188 144*   Cardiac Enzymes:  Recent Labs Lab 08/17/12 1407 08/17/12 1915 08/18/12 0112  TROPONINI <0.30 <0.30 <0.30   BNP: No components found with this basename: POCBNP,  CBG:  Recent Labs Lab 08/20/12 0005 08/20/12 0759 08/20/12 1127 08/20/12 1359 08/20/12 1632  GLUCAP 174* 137* 93 86 120*    Recent Results (from the past 240 hour(s))  BODY FLUID CULTURE  Status: None   Collection Time    08/16/12  4:26 AM      Result Value Range Status   Specimen Description PERITONEAL DIALYSATE   Final   Special Requests NONE   Final   Gram Stain     Final   Value: FEW WBC PRESENT, PREDOMINANTLY PMN     NO ORGANISMS SEEN   Culture RARE YEAST   Final   Report Status PENDING   Incomplete  CULTURE, BLOOD (ROUTINE X 2)     Status: None   Collection Time    08/16/12  3:35 PM      Result Value Range Status   Specimen Description BLOOD ARM RIGHT   Final   Special Requests BOTTLES DRAWN AEROBIC ONLY 5CC   Final   Culture  Setup Time 08/17/2012 00:40   Final   Culture     Final   Value:        BLOOD CULTURE RECEIVED NO GROWTH TO DATE CULTURE WILL BE HELD FOR 5 DAYS BEFORE ISSUING A FINAL NEGATIVE REPORT   Report Status PENDING   Incomplete  CULTURE, BLOOD (ROUTINE X 2)     Status: None   Collection Time    08/16/12  4:04 PM      Result Value Range Status   Specimen Description BLOOD RIGHT ARM   Final   Special Requests BOTTLES DRAWN AEROBIC ONLY 5CC   Final   Culture  Setup Time 08/17/2012 00:44   Final   Culture     Final   Value:        BLOOD CULTURE RECEIVED NO GROWTH TO DATE CULTURE WILL BE HELD FOR 5 DAYS BEFORE ISSUING A FINAL NEGATIVE REPORT   Report Status PENDING   Incomplete  URINE CULTURE     Status: None   Collection Time    08/17/12 12:18 PM      Result Value Range Status   Specimen Description URINE, RANDOM   Final   Special Requests NONE    Final   Culture  Setup Time 08/17/2012 18:18   Final   Colony Count 40,000 COLONIES/ML   Final   Culture YEAST   Final   Report Status 08/18/2012 FINAL   Final  SURGICAL PCR SCREEN     Status: None   Collection Time    08/18/12 10:56 PM      Result Value Range Status   MRSA, PCR NEGATIVE  NEGATIVE Final   Staphylococcus aureus NEGATIVE  NEGATIVE Final   Comment:            The Xpert SA Assay (FDA     approved for NASAL specimens     in patients over 85 years of age),     is one component of     a comprehensive surveillance     program.  Test performance has     been validated by [employer] for patients greater     than or equal to 59 year old.     It is not intended     to diagnose infection nor to     guide or monitor treatment.     Scheduled Meds: . aspirin  81 mg Oral Daily  . clopidogrel  75 mg Oral Q breakfast  . doxercalciferol  2 mcg Intravenous Q M,W,F-HD  . fluconazole (DIFLUCAN) IV  200 mg Intravenous Q24H  . insulin aspart  0-9 Units Subcutaneous Q4H  . lactulose  30 g Oral TID  .  metoprolol succinate  25 mg Oral Daily  . multivitamin  1 tablet Oral Daily  . sodium chloride  3 mL Intravenous Q12H  . [START ON 08/21/2012] vancomycin (VANCOCIN) 750 mg IVPB (Hemodialysis)  750 mg Intravenous Q M,W,F-HD   Continuous Infusions: . sodium chloride 10 mL/hr at 08/20/12 1227  . sodium chloride       Kyan Yurkovich, DO  [facility] Pager [phone]  If 7PM-7AM, please contact night-coverage [email or web address] Password [facility] 08/20/2012, 7:37 PM   LOS: 4 days

## 2012-08-20 NOTE — Progress Notes (Signed)
Utilization review completed.  

## 2012-08-20 NOTE — Preoperative (Signed)
Beta Blockers   Reason not to administer Beta Blockers:Not Applicable, pt took toprol 7/29@ 1028

## 2012-08-20 NOTE — Progress Notes (Signed)
Per later communication with Dr. Arlean Hopping they are going to try to salvage the catheter. Violeta Gelinas, MD, MPH, FACS Pager: [phone]

## 2012-08-20 NOTE — Progress Notes (Signed)
1 Day Post-Op  Subjective: Some abdominal pain but hungry  Objective: Vital signs in last 24 hours: Temp:  [97 F (36.1 C)-99 F (37.2 C)] 98.9 F (37.2 C) (07/28 2012) Pulse Rate:  [71-84] 81 (07/28 2012) Resp:  [12-24] 18 (07/28 2012) BP: (114-147)/(51-80) 116/64 mmHg (07/28 2012) SpO2:  [99 %-100 %] 100 % (07/28 2012) Weight:  [76 kg (167 lb 8.8 oz)-76.1 kg (167 lb 12.3 oz)] 76 kg (167 lb 8.8 oz) (07/28 2012) Last BM Date: 08/18/12 (per pt report)  Intake/Output from previous day: 07/28 0701 - 07/29 0700 In: 300 [I.V.:300] Out: 625 [Blood:10] Intake/Output this shift:    abd soft, PD cath site appears benign, lungs CTA, CV RRR  Lab Results:   Recent Labs  08/18/12 0520  WBC 7.5  HGB 11.2*  HCT 31.6*  PLT 188   BMET  Recent Labs  08/18/12 0520 08/19/12 0742  NA 133* 133*  K 2.5* 2.7*  CL 97 98  CO2 21 18*  GLUCOSE 100* 133*  BUN 51* 73*  CREATININE 10.20* 12.28*  CALCIUM 8.0* 8.2*   PT/INR No results found for this basename: LABPROT, INR,  in the last 72 hours ABG No results found for this basename: PHART, PCO2, PO2, HCO3,  in the last 72 hours  Studies/Results: Dg Abd 1 View  08/19/2012   *RADIOLOGY REPORT*  Clinical Data: Peritoneal dialysis catheter placement  ABDOMEN - 1 VIEW  Comparison: August 17, 2012  Findings: Catheter is positioned in the mid pelvis.  There is moderate stool throughout the colon.  Bowel gas pattern is normal.  No obstruction or free air is seen on this supine examination.  There are no abnormal calcifications.  IMPRESSION: Catheter is positioned in the mid pelvis.  No obstruction or free air is seen on this supine examination.  Note that pneumoperitoneum was only seen on the upright view 2 days previously.   Original Report Authenticated By: Bretta Bang, M.D.   Dg Chest Port 1 View  08/19/2012   *RADIOLOGY REPORT*  Clinical Data: Dialysis catheter placement  PORTABLE CHEST - 1 VIEW  Comparison: 08/17/2012  Findings: Left IJ  approach dual lumen dialysis catheter in place with tip over the right atrium.  Heart size upper limits of normal. Curvilinear left midlung zone probable atelectasis noted.  No pleural effusion.  Heart size is normal.  Right upper quadrant pneumoperitoneum no longer visualized.  IMPRESSION: Left sided hemodialysis catheter tips over the right atrium.   Original Report Authenticated By: Christiana Pellant, M.D.   Dg Fluoro Guide Cv Line-no Report  08/19/2012   CLINICAL DATA: Renal Failure   FLOURO GUIDE CV LINE  Fluoroscopy was utilized by the requesting physician.  No radiographic  interpretation.     Anti-infectives: Anti-infectives   Start     Dose/Rate Route Frequency Ordered Stop   08/20/12 1800  fluconazole (DIFLUCAN) IVPB 200 mg     200 mg 100 mL/hr over 60 Minutes Intravenous Every 24 hours 08/20/12 0808     08/19/12 1645  vancomycin (VANCOCIN) 750 mg in sodium chloride 0.9 % 150 mL IVPB     750 mg 150 mL/hr over 60 Minutes Intravenous  Once 08/19/12 1631 08/19/12 1841   08/19/12 0940  cefUROXime (ZINACEF) 1.5 G injection    Comments:  LEE, HEATHER: cabinet override      08/19/12 0940 08/19/12 0946   08/19/12 0500  cefUROXime (ZINACEF) 1.5 g in dextrose 5 % 50 mL IVPB     1.5 g 100  mL/hr over 30 Minutes Intravenous 60 min pre-op 08/17/12 2052 08/19/12 0730   08/18/12 1830  fluconazole (DIFLUCAN) tablet 200 mg  Status:  Discontinued     200 mg Oral Daily 08/18/12 1733 08/20/12 0808   08/17/12 2100  cefUROXime (ZINACEF) 1.5 g in dextrose 5 % 50 mL IVPB  Status:  Discontinued     1.5 g 100 mL/hr over 30 Minutes Intravenous 60 min pre-op 08/17/12 2047 08/17/12 2052   08/17/12 1500  vancomycin (VANCOCIN) 2,000 mg in sodium chloride 0.9 % 500 mL IVPB     2,000 mg 250 mL/hr over 120 Minutes Intravenous  Once 08/17/12 1452 08/17/12 1802   08/16/12 1500  piperacillin-tazobactam (ZOSYN) IVPB 2.25 g  Status:  Discontinued     2.25 g 100 mL/hr over 30 Minutes Intravenous Every 12 hours 08/16/12  1338 08/17/12 1259   08/16/12 0500  piperacillin-tazobactam (ZOSYN) IVPB 3.375 g     3.375 g 12.5 mL/hr over 240 Minutes Intravenous  Once 08/16/12 0453 08/16/12 0640      Assessment/Plan: s/p Procedure(s): INSERTION OF DIALYSIS CATHETER Internal Jugular (Left) VENOCAVAGRAM SUPERIOR SERIALOGRAM (Left) Fungal peritonitis with PD catheter - to OR today to remove PD catheter - procedure/risks/benefits D/W patient and she agrees. Will resume Plavix post-op.  LOS: 4 days    Shelby Anderle E 08/20/2012

## 2012-08-20 NOTE — Progress Notes (Signed)
Subjective: Peritoneal fluid cx is now + for yeast, we were going to try to salvage cath but now must be removed  Physical Exam:  Blood pressure 116/64, pulse 81, temperature 98.9 F (37.2 C), temperature source Oral, resp. rate 18, height 5\' 1"  (1.549 m), weight 76 kg (167 lb 8.8 oz), SpO2 100.00%. Gen: alert, no distress Resp: clear bilat Cardio:  RRR without murmur or rub GI:  + BS, soft and nontender Extremities:  No edema Access:  PD catheter @ LLQ, AVF @ LUA with + bruit  Recent labs; hgb 9.3 7/3, ferritin 406 and 22% sat 7/3; iPTH 789 7/3  New HD orders:  4 hrs  Bath 2K, 2.25Ca  EDW ?  Assessment/Plan 1. 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 ESRD- HD tomorrow 3. Hypokalemia- replete po, 4K bath with HD 4. HTN/Volume - vol depleted, keep even on HD 5. Anemia - Hgb up, no esa 6. Sec HPT- on vit D, phos up > start renagel 7. Nutrition - Alb 2.6, renal diet, vitamin. 8. DM - per primary.  [name]  MD Pager [phone]    Cell  [phone] 08/20/2012, 8:12 AM   Recent Labs  08/18/12 0520 08/19/12 0742  NA 133* 133*  K 2.5* 2.7*  CL 97 98  CO2 21 18*  GLUCOSE 100* 133*  BUN 51* 73*  CREATININE 10.20* 12.28*  CALCIUM 8.0* 8.2*   Recent Labs  08/18/12 0520  WBC 7.5  HGB 11.2*  HCT 31.6*  PLT 188

## 2012-08-20 NOTE — Progress Notes (Deleted)
Subjective:  Abdominal pain much better, mild soreness after L IJ catheter placement yesterday.  Objective: Vital signs in last 24 hours: Temp:  [97 F (36.1 C)-99 F (37.2 C)] 98.9 F (37.2 C) (07/28 2012) Pulse Rate:  [71-84] 81 (07/28 2012) Resp:  [12-24] 18 (07/28 2012) BP: (114-147)/(51-80) 116/64 mmHg (07/28 2012) SpO2:  [99 %-100 %] 100 % (07/28 2012) Weight:  [76 kg (167 lb 8.8 oz)-76.1 kg (167 lb 12.3 oz)] 76 kg (167 lb 8.8 oz) (07/28 2012) Weight change: 0.4 kg (14.1 oz)  Intake/Output from previous day: 07/28 0701 - 07/29 0700 In: 300 [I.V.:300] Out: 625 [Blood:10]   EXAM: General appearance:  Alert, in no apparent distress Resp:  CTA without rales, rhonchi, or wheezes Cardio:  RRR without murmur or rub GI: + BS, soft and nontender Extremities:  No edema Access:  PD catheter @ LLQ, L IJ catheter (placed 7/28), AVF @ LUA (placed 6/24 by Dr. Edilia Bo) with + bruit  Lab Results:  Recent Labs  08/18/12 0520  WBC 7.5  HGB 11.2*  HCT 31.6*  PLT 188   BMET:  Recent Labs  08/18/12 0520 08/19/12 0742  NA 133* 133*  K 2.5* 2.7*  CL 97 98  CO2 21 18*  GLUCOSE 100* 133*  BUN 51* 73*  CREATININE 10.20* 12.28*  CALCIUM 8.0* 8.2*   No results found for this basename: PTH,  in the last 72 hours Iron Studies: No results found for this basename: IRON, TIBC, TRANSFERRIN, FERRITIN,  in the last 72 hours  Dialysis Orders [facility])  CCPD 6 exchanges 6 fills; 2 L last fill 1L dwell 1:45 daytime exchange fill 2 l Epo 19K SQ 2x weekly; Venofer 100/week plus give 300 venofer 7/25; on Ancef 1 gm IP q daytime dwell through 8/5 = 3 weeks for + corynebacterium PD fluid cultures 7/16. Prior cell count 7/15 was 38 with 75% N, but pt had suboptimal specimen  Recent labs; hgb 9.3 7/3, ferritin 406 and 22% sat 7/3; iPTH 789 7/3   Assessment/Plan: 1. Failure of peritoneal dialysis - new tunneled L IJ catheter placed yesterday by Dr. Imogene Burn, PD cath removal pending prior to  d/c.  2. ESRD - HD yesterday, no more PD.  Awaiting outpatient placement.  3. Hypokalemia - s/p 4K bath with HD and KCl 30 mEq x2 yesterday.  4. HTN/Volume - BP 247/80 on Metoprolol 25 mg qd; volume depletion resolving.   5. Anemia - Hgb 11.2.  Start Aranesp as needed.  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 Needs binder when appetite improves.  7. Nutrition - Alb 2.6, renal diet, vitamin.  8. DM - per primary.     LOS: 4 days   Sally Reimers 08/20/2012,8:10 AM

## 2012-08-20 NOTE — Op Note (Signed)
08/16/2012 - 08/20/2012  1:51 PM  PATIENT:  Lori Greer  42 y.o. female  PRE-OPERATIVE DIAGNOSIS:  Fungal Peritonitis with Peritoneal Dialysis Catheter  POST-OPERATIVE DIAGNOSIS:  Fungal Peritonitis with Peritoneal Dialysis Catheter  PROCEDURE:  Procedure(s): CONTINUOUS AMBULATORY PERITONEAL DIALYSIS  (CAPD) CATHETER REMOVAL  SURGEON:  Surgeon(s): Liz Malady, MD  PHYSICIAN ASSISTANT:   ASSISTANTS: none   ANESTHESIA:   local and general  EBL:     BLOOD ADMINISTERED:none  DRAINS: none   SPECIMEN:  No Specimen  DISPOSITION OF SPECIMEN:  N/A  COUNTS:  YES  DICTATION: Reubin Milan Dictation  Patient had a CAPD catheter placed by Dr. Derrell Lolling. She has developed fungal peritonitis. We are removing her CAPD catheter. Her Plavix has been held. Informed consent was obtained. She was identified in the preop holding area. She is on IV antibiotic protocol. She was brought to the operating room and general anesthesia with laryngeal mask airway was administered by the anesthesia staff. Abdomen was prepped and draped in a sterile fashion. Local anesthetic was injected at the site of her previous insertion site incision. Subcutaneous tissues were dissected down revealing the catheter in the subcutaneous tissues. First, the cuff was dissected out from the tunnel between the catheter and the exit site on the skin. This was done with sharp dissection. It was well incorporated. Once this was freed up the catheter was cut on the external part. Catheter was pulled into the wound. Next, the cuff at the fascial entrance site was circumferentially dissected. We then dissected out the ball underneath it from fibrous connective tissue. This freed up the catheter and it was removed in its entirety. Wound was irrigated.: The fascia was closed with interrupted 0 PDS sutures. Good closure was achieved. Wound was irrigated and the skin was closed with running 4-0 Monocryl subcuticular stitch. Dermabond was then  used over the incision and the tube exit site. All counts were correct. Patient tolerated procedure well without apparent complication was taken recovery in stable condition.  PATIENT DISPOSITION:  PACU - hemodynamically stable.   Delay start of Pharmacological VTE agent (>24hrs) due to surgical blood loss or risk of bleeding:  no  Lori Gelinas, MD, MPH, FACS Pager: [phone]  7/29/20141:51 PM

## 2012-08-20 NOTE — Anesthesia Postprocedure Evaluation (Signed)
  Anesthesia Post-op Note  Patient: Lori Greer  Procedure(s) Performed: Procedure(s): CONTINUOUS AMBULATORY PERITONEAL DIALYSIS  (CAPD) CATHETER REMOVAL (N/A)  Patient Location: PACU  Anesthesia Type:General  Level of Consciousness: awake  Airway and Oxygen Therapy: Patient Spontanous Breathing  Post-op Pain: mild  Post-op Assessment: Post-op Vital signs reviewed  Post-op Vital Signs: stable  Complications: No apparent anesthesia complications

## 2012-08-21 DIAGNOSIS — B999 Unspecified infectious disease: Secondary | ICD-10-CM

## 2012-08-21 DIAGNOSIS — K67 Disorders of peritoneum in infectious diseases classified elsewhere: Secondary | ICD-10-CM | POA: Diagnosis present

## 2012-08-21 LAB — GLUCOSE, CAPILLARY
Glucose-Capillary: 89 mg/dL (ref 70–99)
Glucose-Capillary: 111 mg/dL — ABNORMAL HIGH (ref 70–99)

## 2012-08-21 LAB — BODY FLUID CULTURE

## 2012-08-21 MED ORDER — OXYCODONE HCL 5 MG PO TABS: 5.0000 mg | ORAL_TABLET | Freq: Four times a day (QID) | ORAL | Status: AC | PRN

## 2012-08-21 MED ORDER — FLUCONAZOLE 100 MG PO TABS: 100.0000 mg | ORAL_TABLET | Freq: Every day | ORAL | Status: AC

## 2012-08-21 NOTE — Progress Notes (Addendum)
Subjective: Seen on dialysis, PD catheter removed yesterday, no current complaints  Objective: Vital signs in last 24 hours: Temp:  [97.7 F (36.5 C)-98.9 F (37.2 C)] 98.9 F (37.2 C) (07/30 0700) Pulse Rate:  [67-88] 84 (07/30 1000) Resp:  [18] 18 (07/30 0700) BP: (96-158)/(54-75) 103/70 mmHg (07/30 1000) SpO2:  [98 %-100 %] 98 % (07/30 0700) Weight:  [77 kg (169 lb 12.1 oz)-77.2 kg (170 lb 3.1 oz)] 77.2 kg (170 lb 3.1 oz) (07/30 0700) Weight change: 0.9 kg (1 lb 15.8 oz)  Intake/Output from previous day: 07/29 0701 - 07/30 0700 In: 760 [P.O.:360; I.V.:400] Out: 10 [Blood:10]   Lab Results:  Recent Labs  08/20/12 0944  WBC 6.1  HGB 10.0*  HCT 28.8*  PLT 144*   BMET:  Recent Labs  08/19/12 0742 08/20/12 0944  NA 133* 134*  K 2.7* 3.4*  CL 98 101  CO2 18* 21  GLUCOSE 133* 104*  BUN 73* 51*  CREATININE 12.28* 8.99*  CALCIUM 8.2* 8.3*  ALBUMIN  --  2.0*   No results found for this basename: PTH,  in the last 72 hours Iron Studies: No results found for this basename: IRON, TIBC, TRANSFERRIN, FERRITIN,  in the last 72 hours EXAM: General appearance: Alert, in no apparent distress Resp: CTA without rales, rhonchi, or wheezes Cardio: RR without murmur or rub GI:  + BS, soft, tenderness only at PD cath site Extremities:  No edema Access:  L IJ catheter (placed 7/28), AVF @ LUA (placed 6/24 by Dr. Edilia Bo) with + bruit  Assessment/Plan: 1. Fungal peritonitis - L IJ catheter placed for HD 7/28 by Dr. Imogene Burn, PD cath removed yesterday; on IV Diflucan. OK for discharge.  Will need 3 weeks antifungal therapy (diflucan 200/d), have d/w primary MD and ID on call.   2. ESRD - Plan to start outpatient HD tomorrow @ [facility]. 3. Hypokalemia - K 3.4 yesterday, 4K bath with HD.  4. HTN/Volume - BP 107/71 on Metoprolol 25 mg qd; volume now stable.  Establish EDW.  5. Anemia - Hgb 10.  Start Aranesp as needed.  6. Sec HPT - Ca 8.3 (9.9 corrected), P 5; Hectorol 2 mcg. Needs binder  when appetite improves.  7. Nutrition - Alb 2, renal diet, vitamin. DM - per primary.    LOS: 5 days   LYLES,CHARLES 08/21/2012,10:27 AM  Patient seen and examined.  I agree with assessment and plan as above with additions as indicated. [name]  MD Pager [phone]    Cell  [phone] 08/21/2012, 11:27 AM

## 2012-08-21 NOTE — Progress Notes (Signed)
Patient examined and I agree with the assessment and plan  Violeta Gelinas, MD, MPH, FACS Pager: [phone]  08/21/2012 3:37 PM

## 2012-08-21 NOTE — Progress Notes (Signed)
Patient ID: Lori Greer, female   DOB: Jun 19, 1970, 42 y.o.   MRN: [id number] 1 Day Post-Op  Subjective: Pt feels ok this morning.  Not much pain  Objective: Vital signs in last 24 hours: Temp:  [97.7 F (36.5 C)-98.9 F (37.2 C)] 98.9 F (37.2 C) (07/30 0700) Pulse Rate:  [67-88] 80 (07/30 0930) Resp:  [18] 18 (07/30 0700) BP: (96-158)/(54-75) 96/63 mmHg (07/30 0930) SpO2:  [98 %-100 %] 98 % (07/30 0700) Weight:  [169 lb 12.1 oz (77 kg)-170 lb 3.1 oz (77.2 kg)] 170 lb 3.1 oz (77.2 kg) (07/30 0700) Last BM Date: 08/18/12  Intake/Output from previous day: 07/29 0701 - 07/30 0700 In: 760 [P.O.:360; I.V.:400] Out: 10 [Blood:10] Intake/Output this shift:    PE: Abd: soft, minimally tender, incisions c/d/i, with dermabond.  +BS  Lab Results:   Recent Labs  08/20/12 0944  WBC 6.1  HGB 10.0*  HCT 28.8*  PLT 144*   BMET  Recent Labs  08/19/12 0742 08/20/12 0944  NA 133* 134*  K 2.7* 3.4*  CL 98 101  CO2 18* 21  GLUCOSE 133* 104*  BUN 73* 51*  CREATININE 12.28* 8.99*  CALCIUM 8.2* 8.3*   PT/INR No results found for this basename: LABPROT, INR,  in the last 72 hours CMP     Component Value Date/Time   NA 134* 08/20/2012 0944   K 3.4* 08/20/2012 0944   CL 101 08/20/2012 0944   CO2 21 08/20/2012 0944   GLUCOSE 104* 08/20/2012 0944   BUN 51* 08/20/2012 0944   CREATININE 8.99* 08/20/2012 0944   CALCIUM 8.3* 08/20/2012 0944   PROT 9.6* 08/16/2012 0400   ALBUMIN 2.0* 08/20/2012 0944   AST 30 08/16/2012 0400   ALT <5 08/16/2012 0400   ALKPHOS 122* 08/16/2012 0400   BILITOT 0.3 08/16/2012 0400   GFRNONAA 5* 08/20/2012 0944   GFRAA 6* 08/20/2012 0944   Lipase     Component Value Date/Time   LIPASE 20 08/16/2012 0400       Studies/Results: Dg Abd 1 View  08/19/2012   *RADIOLOGY REPORT*  Clinical Data: Peritoneal dialysis catheter placement  ABDOMEN - 1 VIEW  Comparison: August 17, 2012  Findings: Catheter is positioned in the mid pelvis.  There is moderate stool  throughout the colon.  Bowel gas pattern is normal.  No obstruction or free air is seen on this supine examination.  There are no abnormal calcifications.  IMPRESSION: Catheter is positioned in the mid pelvis.  No obstruction or free air is seen on this supine examination.  Note that pneumoperitoneum was only seen on the upright view 2 days previously.   Original Report Authenticated By: Bretta Bang, M.D.   Dg Chest Port 1 View  08/19/2012   *RADIOLOGY REPORT*  Clinical Data: Dialysis catheter placement  PORTABLE CHEST - 1 VIEW  Comparison: 08/17/2012  Findings: Left IJ approach dual lumen dialysis catheter in place with tip over the right atrium.  Heart size upper limits of normal. Curvilinear left midlung zone probable atelectasis noted.  No pleural effusion.  Heart size is normal.  Right upper quadrant pneumoperitoneum no longer visualized.  IMPRESSION: Left sided hemodialysis catheter tips over the right atrium.   Original Report Authenticated By: Christiana Pellant, M.D.   Dg Fluoro Guide Cv Line-no Report  08/19/2012   CLINICAL DATA: Renal Failure   FLOURO GUIDE CV LINE  Fluoroscopy was utilized by the requesting physician.  No radiographic  interpretation.     Anti-infectives: Anti-infectives  Start     Dose/Rate Route Frequency Ordered Stop   08/21/12 1200  vancomycin (VANCOCIN) 750 mg in sodium chloride 0.9 % 150 mL IVPB     750 mg 150 mL/hr over 60 Minutes Intravenous Every M-W-F (Hemodialysis) 08/20/12 1127     08/20/12 1800  fluconazole (DIFLUCAN) IVPB 200 mg     200 mg 100 mL/hr over 60 Minutes Intravenous Every 24 hours 08/20/12 0808     08/19/12 1645  vancomycin (VANCOCIN) 750 mg in sodium chloride 0.9 % 150 mL IVPB     750 mg 150 mL/hr over 60 Minutes Intravenous  Once 08/19/12 1631 08/19/12 1841   08/19/12 0940  cefUROXime (ZINACEF) 1.5 G injection    Comments:  LEE, HEATHER: cabinet override      08/19/12 0940 08/19/12 0946   08/19/12 0500  cefUROXime (ZINACEF) 1.5 g in  dextrose 5 % 50 mL IVPB     1.5 g 100 mL/hr over 30 Minutes Intravenous 60 min pre-op 08/17/12 2052 08/19/12 0730   08/18/12 1830  fluconazole (DIFLUCAN) tablet 200 mg  Status:  Discontinued     200 mg Oral Daily 08/18/12 1733 08/20/12 0808   08/17/12 2100  cefUROXime (ZINACEF) 1.5 g in dextrose 5 % 50 mL IVPB  Status:  Discontinued     1.5 g 100 mL/hr over 30 Minutes Intravenous 60 min pre-op 08/17/12 2047 08/17/12 2052   08/17/12 1500  vancomycin (VANCOCIN) 2,000 mg in sodium chloride 0.9 % 500 mL IVPB     2,000 mg 250 mL/hr over 120 Minutes Intravenous  Once 08/17/12 1452 08/17/12 1802   08/16/12 1500  piperacillin-tazobactam (ZOSYN) IVPB 2.25 g  Status:  Discontinued     2.25 g 100 mL/hr over 30 Minutes Intravenous Every 12 hours 08/16/12 1338 08/17/12 1259   08/16/12 0500  piperacillin-tazobactam (ZOSYN) IVPB 3.375 g     3.375 g 12.5 mL/hr over 240 Minutes Intravenous  Once 08/16/12 0453 08/16/12 0640       Assessment/Plan  1. S/p PD cath removal  Plan: 1. May advance diet as tolerates.  Follow up with surgery as needed.  No further surgical role.  Please call as needed.   LOS: 5 days    Merritt Kibby E 08/21/2012, 9:55 AM Pager: [phone]

## 2012-08-21 NOTE — Discharge Summary (Addendum)
Physician Discharge Summary  Lori Greer:[id number] DOB: 1970-03-09 DOA: 08/16/2012  PCP: Rogelia Boga, MD  Admit date: 08/16/2012 Discharge date: 08/21/2012  Time spent: 40  minutes  Recommendations for Outpatient Follow-up:  Home with outpt PCP and renal follow up  please follow antifungal sensitivity to candida in peritoneal fluid  Discharge Diagnoses:  Principal Problem:   Peritonitis in infectious disease, (due to candida)  Active Problems:   DM type 2, uncontrolled, with renal complications   Proliferative diabetic retinopathy   Hypertension   CAD (coronary artery disease)   End-stage renal disease on peritoneal dialysis   Secondary hyperparathyroidism (of renal origin)   Lactic acidosis   Abdominal pain   Hypokalemia   Nausea and vomiting   Dehydration   Discharge Condition: fair  Diet recommendation: renal  Filed Weights   08/19/12 2012 08/20/12 1957 08/21/12 0700  Weight: 76 kg (167 lb 8.8 oz) 77 kg (169 lb 12.1 oz) 77.2 kg (170 lb 3.1 oz)    History of present illness:  42 year old female with a history of ESRD on peritoneal dialysis presented with one week history , dizziness with standing, intermitten vomiting and abdominal pain. In ED, the patient was found to have a lactic acidosis with a lactate of 3.4. Blood glucose was 500 with anion gap of 22. Serum acetone was negative. The patient improved with subcutaneous insulin. Her hemoglobin A1c was noted to be 5.7. Her abdominal pain gradually improved but persisted despite treatment of her hyperglycemia. Although the patient's dialysate cell count was only 73, she continued to have intermittent abdominal pain with a number of complications since placement of her Tenckhoff catheter in March 2014. In addition, PD fluid grew yeast. The patient was started on fluconazole due to the reasons above and the fact that she continued to have cloudy dialysate. Hemodialysis catheter was placed for conversion to  HD. Her Tenckhoff catheter removed on 08/20/2012. Although the patient was initially started on Zosyn, this was discontinued. Her abdominal pain continued to improve and tolerated a renal diet.   Hospital Course:   Abdominal pain  -Overall improved and abdominal pain resolved -CT abdomen without any bowel defects or extraluminal contrast  -May be related to hyperosmolar nonketotic state secondary to the patient's profound hyperglycemia--520 on admission  -Lipase of 20  tolerating regular diet now.  Candida peritonitis -Although the patient did not have significant pleocytosis in PD fluid (73 WBC--52%Neutrophils), she  continues to have abdominal pain and cloudy dialysate.  -started patient on empiric fluconazole, 200 mg daily. Culture growing candida parapsilosis. Plan to treat for 4 weeks with po fluconazole . Discussed with Dr Orvan Falconer over the phone and agree with the regimen and duration of treatment. Patient will be treated for 4 weeks until 09/14/2012 -PD catheter removal on 08/20/2012.  -New HD catheter placed on 08/19/12  By Dr. Imogene Burn  -Blood cultures remain negative.   ? Corynebacterium peritonitis  -Deferred to renal regarding treatment -PD fluid Cell count on July 15--->30  -IP/IV Vancomycin per nephrology  - HD catheter placed 08/19/2012   Diabetes mellitus type 2  -Hemoglobin A1c 5.7  -Acetone negative  -Patient only intermittently uses insulin at home  fsg stable -does not need insulin upon discharge   Lactic  acidosis  -resolved  CKD stage V, on peritoneal dialysis  Switched to HD this admission -Appreciate renal followup  Has outpt HD set up. Continue sevelamer, calcitriol and MV  Hypokalemia  Resolved  Hypertension  -Resume  metoprolol succinate  Coronary artery disease  -DES to mid LAD 02/23/2012  -Resume aspirin and Plavix  Family Communication: family  at bedside Disposition Plan: Home   Antibiotics:  Zosyn 08/16/2012>>>08/17/12  Fluconazole  08/18/2012>>> until 09/14/2012 Vancomycin 08/19/2012>>>     Procedures:  Left HD catheter on 7/29  Consultations:  Renal   Vascular surgery  Discharge Exam: Filed Vitals:   08/21/12 1000 08/21/12 1030 08/21/12 1100 08/21/12 1110  BP: 103/70 113/60 129/65 131/76  Pulse: 84 78 82 92  Temp:    98.5 F (36.9 C)  TempSrc:    Oral  Resp:    18  Height:      Weight:      SpO2:    97%    General: middle aged female in NAD  HEENT: no pallor, let HD catheter Cardiovascular: NS1&S2, no murmurs Respiratory: clear b/l, no added sounds Abd: soft, NT, ND, BS+ Ext: warm, no edema  CNS: AAOX3  Discharge Instructions   Future Appointments Provider Department Dept Phone   09/25/2012 9:30 AM [facility] 1 [facility] [phone]   09/25/2012 10:45 AM Chuck Hint, MD [facility] [phone]       Medication List         aspirin EC 81 MG tablet  Take 81 mg by mouth daily.     bisacodyl 5 MG EC tablet  Commonly known as:  DULCOLAX  Take 5 mg by mouth daily as needed for constipation.     calcitRIOL 0.5 MCG capsule  Commonly known as:  ROCALTROL  Take 1 mcg by mouth daily.     clopidogrel 75 MG tablet  Commonly known as:  PLAVIX  Take 75 mg by mouth daily.     fluconazole 100 MG tablet  Commonly known as:  DIFLUCAN  Take 1 tablet (100 mg total) by mouth daily. UNTIL 09/14/2012     folic acid-vitamin b complex-vitamin c-selenium-zinc 3 MG Tabs  Take 1 tablet by mouth daily.     metoprolol succinate 50 MG 24 hr tablet  Commonly known as:  TOPROL-XL  Take 50 mg by mouth daily.     oxyCODONE 5 MG immediate release tablet  Commonly known as:  Oxy IR/ROXICODONE  Take 1 tablet (5 mg total) by mouth every 6 (six) hours as needed (pain).     polyethylene glycol powder powder  Commonly known as:  GLYCOLAX/MIRALAX  Take 17 g by mouth daily.     pravastatin 40 MG tablet  Commonly known as:  PRAVACHOL   Take 40 mg by mouth every evening.     sevelamer carbonate 800 MG tablet  Commonly known as:  RENVELA  Take 1,600 mg by mouth 3 (three) times daily with meals. Takes 1600mg  with each meal, and 800mg  with each snack.       No Known Allergies     Follow-up Information   Follow up with Rogelia Boga, MD. Schedule an appointment as soon as possible for a visit in 1 week.   Contact information:   [address] [place] [address] [phone]       Follow up with Dagoberto Ligas., MD. Schedule an appointment as soon as possible for a visit in 2 weeks.   Contact information:   [address] [facility] [place] [address] [phone]        The results of significant diagnostics from this hospitalization (including imaging, microbiology, ancillary and laboratory) are listed below for reference.  Significant Diagnostic Studies: Ct Abdomen Pelvis Wo Contrast  08/16/2012   *RADIOLOGY REPORT*  Clinical Data: Diabetic hypertensive patient with end-stage renal disease on peritoneal dialysis.  Feeling weak which became worse when she started peritoneal dialysis.  Intermittent abdominal pain. Question perforation?  Prior hysterectomy and appendectomy.  CT ABDOMEN AND PELVIS WITHOUT CONTRAST  Technique:  Multidetector CT imaging of the abdomen and pelvis was performed following the standard protocol without intravenous contrast.  Comparison: 07/18/2012 plain film examination.  12/18/2009 CT.  Findings: Peritoneal dialysis catheter enters from the left lower abdomen and the tip curled within the pelvis.  Fluid is noted throughout the abdomen pelvis.  Additionally, there is free intraperitoneal air most notable in the upper abdomen.  This amount of free air could conceivably be related to air introduced at the time of the peritoneal dialysis.  Bowel perforation as cause of free intraperitoneal air is not excluded. There is however, no discrete bowel defect or  extraluminal contrast.  Surgical consultation and / or close follow-up imaging recommended.  Free air is more notable adjacent to the stomach.  Thickening of the third portion of the duodenum may represent under distension or mild inflammation.  Narrowing of portions of the colon (such as at the level of the hepatic flexure) may represent result of peristalsis limiting excluding underlying circumferential mass.  Clinical history states the patient is post appendectomy however, radiopaque material right abdomen has an appearance suggestive of the appendix.  Evaluation of solid abdominal viscera is limited by lack of IV contrast.  Taking this limitation into account no worrisome hepatic, splenic, pancreatic, adrenal or renal lesion.  No calcified gallstone.  Limited for evaluating for cholecystitis given the surrounding fluid.  Ultrasound can be obtained for further delineation if this is of concern.  No bony destructive lesion or CT evidence of disc space infection.  Cardiomegaly.  Suggestion of prominent coronary artery calcifications.  No abdominal aortic aneurysm.  Partial calcification left common iliac artery.  No worrisome adenopathy.  Lung bases clear.  IMPRESSION: Peritoneal dialysis catheter enters from the left lower abdomen and the tip curled within the pelvis.  Fluid is noted throughout the abdomen pelvis.  Additionally, there is free intraperitoneal air most notable in the upper abdomen.  This amount of free air could conceivably be related to air introduced at the time of the peritoneal dialysis.  Bowel perforation as cause of free intraperitoneal air is not excluded. There is however, no discrete bowel defect or extraluminal contrast.  Surgical consultation and / or close follow-up imaging recommended depending on clinical suspicion.  Please see above discussion.  Critical Value/emergent results were called by telephone at the time of interpretation on 08/16/2012 at 8:00 a.m. to Dr. Dan Humphreys., who  verbally acknowledged these results.   Original Report Authenticated By: Lacy Duverney, M.D.   Dg Abd 1 View  08/19/2012   *RADIOLOGY REPORT*  Clinical Data: Peritoneal dialysis catheter placement  ABDOMEN - 1 VIEW  Comparison: August 17, 2012  Findings: Catheter is positioned in the mid pelvis.  There is moderate stool throughout the colon.  Bowel gas pattern is normal.  No obstruction or free air is seen on this supine examination.  There are no abnormal calcifications.  IMPRESSION: Catheter is positioned in the mid pelvis.  No obstruction or free air is seen on this supine examination.  Note that pneumoperitoneum was only seen on the upright view 2 days previously.   Original Report Authenticated By: Bretta Bang, M.D.   Ir Removal  Tun Cv Cath W/o Fl  08/16/2012   *RADIOLOGY REPORT*  Clinical Data/Indication: Peritoneal dialysis  TUNNEL CATHETER REMOVAL  Procedure: The procedure, risks, benefits, and alternatives were explained to the patient. Questions regarding the procedure were encouraged and answered. The patient understands and consents to the procedure.  The left chest was prepped with betadine in a sterile fashion, and a sterile drape was applied covering the operative field. A sterile gown and sterile gloves were used for the procedure.  1% lidocaine was utilized for local anesthesia. Utilizing blunt dissection, the cuff of the catheter was freed from the underlying subcutaneous tissue. The catheter was removed in its entirety. Hemostasis was achieved with direct pressure.  Complications: None.  IMPRESSION: Successful tunneled dialysis catheter removal.   Original Report Authenticated By: Jolaine Click, M.D.   Dg Chest Port 1 View  08/19/2012   *RADIOLOGY REPORT*  Clinical Data: Dialysis catheter placement  PORTABLE CHEST - 1 VIEW  Comparison: 08/17/2012  Findings: Left IJ approach dual lumen dialysis catheter in place with tip over the right atrium.  Heart size upper limits of normal.  Curvilinear left midlung zone probable atelectasis noted.  No pleural effusion.  Heart size is normal.  Right upper quadrant pneumoperitoneum no longer visualized.  IMPRESSION: Left sided hemodialysis catheter tips over the right atrium.   Original Report Authenticated By: Christiana Pellant, M.D.   Dg Chest Portable 1 View  08/16/2012   *RADIOLOGY REPORT*  Clinical Data: Hypotension  PORTABLE CHEST - 1 VIEW  Comparison: Prior radiograph from 07/18/2012  Findings: A left-sided dual-lumen hemodialysis catheter is in place with tip overlying the mid SVC.  Cardiomegaly is not significantly changed as compared to prior exam.  Curvilinear lucency is seen overlying the dome of the liver, underneath the right hemidiaphragm, concerning for free intraperitoneal air.  The lungs are clear without airspace consolidation, pleural effusion, or pulmonary edema.  There is no pneumothorax on this semi erect portable exam.  IMPRESSION:  1.  Curvilinear lucency underneath the right hemidiaphragm, concerning for free intraperitoneal air.  This finding is of uncertain clinical etiology, and may be related to the presence of a peritoneal dialysis catheter.  Further evaluation with cross sectional imaging of the abdomen and pelvis could be performed as clinically indicated. 2.  Stable cardiomegaly without edema. 3.  No airspace consolidation to suggest acute infectious pneumonitis.  Critical Value/emergent results were called by telephone at the time of interpretation on 08/16/12 at 0520 am to Sunnie Nielsen by Dr. Hoy Morn, who verbally acknowledged these results.   Original Report Authenticated By: Rise Mu, M.D.   Dg Abd Acute W/chest  08/17/2012   *RADIOLOGY REPORT*  Clinical Data: 3 days of abdominal pain.  ACUTE ABDOMEN SERIES (ABDOMEN 2 VIEW & CHEST 1 VIEW)  Comparison: CT abdomen and pelvis from yesterday.  Findings: Pneumoperitoneum persists, located in the right diaphragm. There is also small volume sub hepatic  gas.  Bowel gas pattern is nonobstructed, with progression of positive enteric contrast through the colon. There are some fluid levels within the colon, nonspecific.  Chain sutures seen in the right lower quadrant.  Peritoneal dialysis catheter continues project over the low pelvis.  No abnormal intra-abdominal mass effect.  No acute osseous findings.  The lungs are clear.  No effusion or pneumothorax.  Normal heart size and mediastinal contours.  These results were called by telephone on 08/17/2012 at 22:35 to [facility] Benedetto Coons, who verbally acknowledged these results.  IMPRESSION:  1.  Persistent pneumoperitoneum, likely similar  volume compared to yesterday.  The gas could originate from a peritoneal dialysis catheter or a hollow viscus perforation.   2. Nonobstructive bowel gas pattern.   Original Report Authenticated By: Tiburcio Pea   Dg Fluoro Guide Cv Line-no Report  08/19/2012   CLINICAL DATA: Renal Failure   FLOURO GUIDE CV LINE  Fluoroscopy was utilized by the requesting physician.  No radiographic  interpretation.     Microbiology: Recent Results (from the past 240 hour(s))  BODY FLUID CULTURE     Status: None   Collection Time    08/16/12  4:26 AM      Result Value Range Status   Specimen Description PERITONEAL DIALYSATE   Final   Special Requests NONE   Final   Gram Stain     Final   Value: FEW WBC PRESENT, PREDOMINANTLY PMN     NO ORGANISMS SEEN   Culture RARE CANDIDA PARAPSILOSIS   Final   Report Status 08/21/2012 FINAL   Final  CULTURE, BLOOD (ROUTINE X 2)     Status: None   Collection Time    08/16/12  3:35 PM      Result Value Range Status   Specimen Description BLOOD ARM RIGHT   Final   Special Requests BOTTLES DRAWN AEROBIC ONLY 5CC   Final   Culture  Setup Time 08/17/2012 00:40   Final   Culture     Final   Value:        BLOOD CULTURE RECEIVED NO GROWTH TO DATE CULTURE WILL BE HELD FOR 5 DAYS BEFORE ISSUING A FINAL NEGATIVE REPORT   Report Status PENDING   Incomplete   CULTURE, BLOOD (ROUTINE X 2)     Status: None   Collection Time    08/16/12  4:04 PM      Result Value Range Status   Specimen Description BLOOD RIGHT ARM   Final   Special Requests BOTTLES DRAWN AEROBIC ONLY 5CC   Final   Culture  Setup Time 08/17/2012 00:44   Final   Culture     Final   Value:        BLOOD CULTURE RECEIVED NO GROWTH TO DATE CULTURE WILL BE HELD FOR 5 DAYS BEFORE ISSUING A FINAL NEGATIVE REPORT   Report Status PENDING   Incomplete  URINE CULTURE     Status: None   Collection Time    08/17/12 12:18 PM      Result Value Range Status   Specimen Description URINE, RANDOM   Final   Special Requests NONE   Final   Culture  Setup Time 08/17/2012 18:18   Final   Colony Count 40,000 COLONIES/ML   Final   Culture YEAST   Final   Report Status 08/18/2012 FINAL   Final  SURGICAL PCR SCREEN     Status: None   Collection Time    08/18/12 10:56 PM      Result Value Range Status   MRSA, PCR NEGATIVE  NEGATIVE Final   Staphylococcus aureus NEGATIVE  NEGATIVE Final   Comment:            The Xpert SA Assay (FDA     approved for NASAL specimens     in patients over 5 years of age),     is one component of     a comprehensive surveillance     program.  Test performance has     been validated by [employer] for patients greater  than or equal to 27 year old.     It is not intended     to diagnose infection nor to     guide or monitor treatment.     Labs: Basic Metabolic Panel:  Recent Labs Lab 08/16/12 0400 08/16/12 0404 08/17/12 0748 08/18/12 0520 08/19/12 0742 08/20/12 0944  NA 133* 134* 130* 133* 133* 134*  K 2.5* 2.4* 2.5* 2.5* 2.7* 3.4*  CL 88* 97 90* 97 98 101  CO2 23  --  23 21 18* 21  GLUCOSE 520* 526* 189* 100* 133* 104*  BUN 45* 43* 41* 51* 73* 51*  CREATININE 9.49* 9.10* 8.86* 10.20* 12.28* 8.99*  CALCIUM 11.1*  --  9.0 8.0* 8.2* 8.3*  MG 2.3  --   --   --   --   --   PHOS 7.5*  --  7.2*  --   --  5.0*   Liver Function Tests:  Recent  Labs Lab 08/16/12 0400 08/17/12 0748 08/20/12 0944  AST 30  --   --   ALT <5  --   --   ALKPHOS 122*  --   --   BILITOT 0.3  --   --   PROT 9.6*  --   --   ALBUMIN 3.6 2.6* 2.0*    Recent Labs Lab 08/16/12 0400  LIPASE 20   No results found for this basename: AMMONIA,  in the last 168 hours CBC:  Recent Labs Lab 08/16/12 0400 08/16/12 0404 08/17/12 0525 08/18/12 0520 08/20/12 0944  WBC 10.9*  --  9.4 7.5 6.1  NEUTROABS  --   --  7.3  --   --   HGB 16.8* 18.4* 13.6 11.2* 10.0*  HCT 47.1* 54.0* 37.1 31.6* 28.8*  MCV 86.7  --  85.7 86.8 87.8  PLT 359  --  249 188 144*   Cardiac Enzymes:  Recent Labs Lab 08/17/12 1407 08/17/12 1915 08/18/12 0112  TROPONINI <0.30 <0.30 <0.30   BNP: BNP (last 3 results) No results found for this basename: PROBNP,  in the last 8760 hours CBG:  Recent Labs Lab 08/20/12 1359 08/20/12 1632 08/20/12 1957 08/20/12 2325 08/21/12 0355  GLUCAP 86 120* 160* 154* 89       Signed:  Rowan Pollman  [facility] 08/21/2012, 12:17 PM

## 2012-08-21 NOTE — Progress Notes (Signed)
Per pt, refuses AM vitals until after 7am. Lori Greer

## 2012-08-21 NOTE — Progress Notes (Signed)
08/21/2012 10:09 AM Hemodialysis Outpatient Note; this patient has been accepted at the [facility] dialysis center on a Tues/Thurs/Sat schedule,chair time is 1215. Center can start treatment on Thursday the 31st. Thank you. Tilman Neat

## 2012-08-21 NOTE — Procedures (Signed)
I was present at this dialysis session. I have reviewed the session itself and made appropriate changes.   Lori Moselle  MD Pager [phone]    Cell  [phone] 08/21/2012, 10:26 AM

## 2012-08-23 ENCOUNTER — Encounter (HOSPITAL_COMMUNITY): Payer: Self-pay | Admitting: General Surgery

## 2012-08-23 LAB — CULTURE, BLOOD (ROUTINE X 2)
Culture: NO GROWTH
Culture: NO GROWTH

## 2012-08-26 ENCOUNTER — Other Ambulatory Visit: Payer: Self-pay | Admitting: Internal Medicine

## 2012-09-11 ENCOUNTER — Other Ambulatory Visit: Payer: Self-pay

## 2012-09-11 DIAGNOSIS — Z1231 Encounter for screening mammogram for malignant neoplasm of breast: Secondary | ICD-10-CM

## 2012-09-24 ENCOUNTER — Encounter: Payer: Self-pay | Admitting: Vascular Surgery

## 2012-09-25 ENCOUNTER — Encounter: Payer: Self-pay | Admitting: Vascular Surgery

## 2012-09-25 ENCOUNTER — Ambulatory Visit: Payer: Medicare Other | Admitting: Vascular Surgery

## 2012-09-25 ENCOUNTER — Encounter (INDEPENDENT_AMBULATORY_CARE_PROVIDER_SITE_OTHER): Payer: Medicare Other | Admitting: *Deleted

## 2012-09-25 ENCOUNTER — Ambulatory Visit (INDEPENDENT_AMBULATORY_CARE_PROVIDER_SITE_OTHER): Payer: Medicare Other | Admitting: Vascular Surgery

## 2012-09-25 ENCOUNTER — Other Ambulatory Visit: Payer: Self-pay | Admitting: *Deleted

## 2012-09-25 VITALS — BP 146/85 | HR 83 | Temp 98.1°F | Ht 62.0 in | Wt 175.0 lb

## 2012-09-25 DIAGNOSIS — Z4931 Encounter for adequacy testing for hemodialysis: Secondary | ICD-10-CM

## 2012-09-25 DIAGNOSIS — N186 End stage renal disease: Secondary | ICD-10-CM

## 2012-09-25 NOTE — Progress Notes (Signed)
Patient name: Lori Greer MRN: [id number] DOB: Sep 27, 1970 Sex: female  REASON FOR VISIT: follow up after left AV fistula  HPI: Lori Greer is a 42 y.o. female who had a left brachiocephalic AV fistula placed on 40/98/1191. She comes in for a 6 week follow up visit. She has no specific complaints. She is dialyzing with a left IJ tunneled dialysis catheter.  REVIEW OF SYSTEMS: [name] ] denotes positive finding; [  ] denotes negative finding  CARDIOVASCULAR:  [ ]  chest pain   [ ]  dyspnea on exertion    CONSTITUTIONAL:  [ ]  fever   [ ]  chills  PHYSICAL EXAM: Filed Vitals:   09/25/12 1338  BP: 146/85  Pulse: 83  Temp: 98.1 F (36.7 C)  TempSrc: Oral  Height: 5\' 2"  (1.575 m)  Weight: 175 lb (79.379 kg)  SpO2: 100%   Body mass index is 32 kg/(m^2). GENERAL: The patient is a well-nourished female, in no acute distress. The vital signs are documented above. CARDIOVASCULAR: There is a regular rate and rhythm  PULMONARY: There is good air exchange bilaterally without wheezing or rales. Her left upper arm fistula has an excellent thrill and bruit. It appears to be maturing nicely. She has a palpable left radial pulse.  I have independently interpreted her duplex of her fistula. This shows an area of increased callosity in the proximal fistula. Diameters of the fistula range from 0.58 to 0.65 cm. Depths are all less than 0.6 cm.  MEDICAL ISSUES: Although her duplex suggests some narrowing in the proximal fistula, the fistula proximal to this area is not pulsatile. Therefore, I do not think that this area of increased velocity seen on duplex is significant. She is 6 weeks from surgery. I were to follow duplex scan in 6 weeks. If the fistula looks good at that time and I think they can begin using it for dialysis.  Lori Greer S [facility] [place] Beeper: [phone]

## 2012-10-02 ENCOUNTER — Ambulatory Visit: Payer: No Typology Code available for payment source

## 2012-10-04 ENCOUNTER — Encounter: Payer: Medicare Other | Admitting: Internal Medicine

## 2012-10-18 ENCOUNTER — Ambulatory Visit
Admission: RE | Admit: 2012-10-18 | Discharge: 2012-10-18 | Disposition: A | Discharge status: Home / Self Care | Payer: Medicare Other | Source: Ambulatory Visit

## 2012-10-18 DIAGNOSIS — Z1231 Encounter for screening mammogram for malignant neoplasm of breast: Secondary | ICD-10-CM

## 2012-10-23 ENCOUNTER — Other Ambulatory Visit: Payer: Self-pay | Admitting: Internal Medicine

## 2012-10-23 DIAGNOSIS — R928 Other abnormal and inconclusive findings on diagnostic imaging of breast: Secondary | ICD-10-CM

## 2012-10-30 ENCOUNTER — Ambulatory Visit: Payer: Medicare Other | Admitting: Vascular Surgery

## 2012-10-30 ENCOUNTER — Other Ambulatory Visit (HOSPITAL_COMMUNITY): Payer: Medicare Other

## 2012-11-01 ENCOUNTER — Encounter: Payer: Medicare Other | Admitting: Internal Medicine

## 2012-11-01 DIAGNOSIS — Z0289 Encounter for other administrative examinations: Secondary | ICD-10-CM

## 2012-11-06 ENCOUNTER — Ambulatory Visit
Admission: RE | Admit: 2012-11-06 | Discharge: 2012-11-06 | Disposition: A | Discharge status: Home / Self Care | Payer: Medicare Other | Source: Ambulatory Visit | Attending: Internal Medicine | Admitting: Internal Medicine

## 2012-11-06 DIAGNOSIS — R928 Other abnormal and inconclusive findings on diagnostic imaging of breast: Secondary | ICD-10-CM

## 2012-11-08 ENCOUNTER — Telehealth: Payer: Self-pay

## 2012-11-08 NOTE — Telephone Encounter (Signed)
Rec'd phone call from the [facility].  Requested to schedule removal of dialysis catheter.  Noted in the last OV with Dr. Edilia Bo on 9/3 the duplex showed some narrowing in the proximal fistula, and he recommended to repeat duplex in 6 weeks; if AVF looked okay at the follow-up duplex , then it would be okay to start using the AVF.  (Pt. had appt. On 10/8, but had to reschedule to 11/5)   Jasmine December, RN at the dialysis center stated the technician reported sticking the AVF with 2- 17 gauge needles and obtained blood flow rate of 300.   Advised nurse that based on Dr. Adele Dan last office note, I would recommend not sticking the AVF anymore, until the follow-up duplex is completed to check maturity.  Will not sched. Cath. Removal at this time.  Will try to move pt's appt. To an earlier date.

## 2012-11-12 ENCOUNTER — Encounter: Payer: Self-pay | Admitting: Vascular Surgery

## 2012-11-13 ENCOUNTER — Ambulatory Visit (HOSPITAL_COMMUNITY)
Admission: RE | Admit: 2012-11-13 | Discharge: 2012-11-13 | Disposition: A | Discharge status: Home / Self Care | Payer: Medicare Other | Source: Ambulatory Visit | Attending: Vascular Surgery | Admitting: Vascular Surgery

## 2012-11-13 ENCOUNTER — Encounter: Payer: Self-pay | Admitting: Vascular Surgery

## 2012-11-13 ENCOUNTER — Ambulatory Visit (INDEPENDENT_AMBULATORY_CARE_PROVIDER_SITE_OTHER): Payer: Medicare Other | Admitting: Vascular Surgery

## 2012-11-13 VITALS — BP 151/82 | HR 84 | Ht 62.0 in | Wt 173.6 lb

## 2012-11-13 DIAGNOSIS — N186 End stage renal disease: Secondary | ICD-10-CM

## 2012-11-13 DIAGNOSIS — Z4931 Encounter for adequacy testing for hemodialysis: Secondary | ICD-10-CM

## 2012-11-13 NOTE — Assessment & Plan Note (Signed)
Her left brachiocephalic fistula appears to be maturing adequately. This is being used for dialysis without difficulty according to the patient. I think it would be safe to remove her tunneled dialysis catheter at any time. I will see her back as needed.

## 2012-11-13 NOTE — Progress Notes (Signed)
Patient name: Lori Greer MRN: [id number] DOB: 1970-06-12 Sex: female  REASON FOR VISIT: follow up after left brachiocephalic AV fistula.  HPI: Lori Greer is a 42 y.o. female who had been on peritoneal dialysis. On 07/16/2012 I placed a left brachiocephalic AV fistula. She has a tunneled dialysis catheter. She states that they have been using the fistula without difficulty. She denies pain or paresthesias in her left upper extremity.  REVIEW OF SYSTEMS: [name] ] denotes positive finding; [  ] denotes negative finding  CARDIOVASCULAR:  [ ]  chest pain   [ ]  dyspnea on exertion    CONSTITUTIONAL:  [ ]  fever   [ ]  chills  PHYSICAL EXAM: Filed Vitals:   11/13/12 1610  BP: 151/82  Pulse: 84  Height: 5\' 2"  (1.575 m)  Weight: 173 lb 9.6 oz (78.744 kg)  SpO2: 100%   Body mass index is 31.74 kg/(m^2). GENERAL: The patient is a well-nourished female, in no acute distress. The vital signs are documented above. CARDIOVASCULAR: There is a regular rate and rhythm  PULMONARY: There is good air exchange bilaterally without wheezing or rales. Her left brachiocephalic AV fistula has an excellent bruit and thrill. It is not pulsatile.  Duplex of her fistula shows that the diameters of the fistula range from 0.61-0.9 cm. Depths appear reasonable. There are some elevated velocities in the proximal fistula however the fistula is not pulsatile proximally to suggest that these are significant.  MEDICAL ISSUES:  End stage renal disease Her left brachiocephalic fistula appears to be maturing adequately. This is being used for dialysis without difficulty according to the patient. I think it would be safe to remove her tunneled dialysis catheter at any time. I will see her back as needed.   DICKSON,CHRISTOPHER S [facility] [place] Beeper: [phone]

## 2012-11-14 ENCOUNTER — Other Ambulatory Visit: Payer: Self-pay | Admitting: *Deleted

## 2012-11-18 ENCOUNTER — Encounter (HOSPITAL_COMMUNITY): Payer: Medicare Other

## 2012-11-19 ENCOUNTER — Ambulatory Visit (HOSPITAL_COMMUNITY)
Admission: RE | Admit: 2012-11-19 | Discharge: 2012-11-19 | Disposition: A | Discharge status: Home / Self Care | Payer: Medicare Other | Source: Ambulatory Visit | Attending: Vascular Surgery | Admitting: Vascular Surgery

## 2012-11-19 ENCOUNTER — Encounter: Payer: Self-pay | Admitting: Thoracic Diseases

## 2012-11-19 DIAGNOSIS — N186 End stage renal disease: Secondary | ICD-10-CM

## 2012-11-19 DIAGNOSIS — Z452 Encounter for adjustment and management of vascular access device: Secondary | ICD-10-CM | POA: Insufficient documentation

## 2012-11-19 DIAGNOSIS — I120 Hypertensive chronic kidney disease with stage 5 chronic kidney disease or end stage renal disease: Secondary | ICD-10-CM | POA: Insufficient documentation

## 2012-11-19 DIAGNOSIS — E119 Type 2 diabetes mellitus without complications: Secondary | ICD-10-CM | POA: Insufficient documentation

## 2012-11-19 NOTE — Progress Notes (Unsigned)
Patient ID: Lori Greer, female   DOB: 1970/07/08, 42 y.o.   MRN: [id number]   [facility] SHORT STAY H&P  CC: ESRD   HPI: Lori Greer is a 42 y.o. female who has been on HD through  functioning Hemodialysis access in the left upper extremity. They are here for HD catheter removal. Pt. denies signs of steal syndrome.  Past Medical History  Diagnosis Date  . Bell's palsy 05/12/2008  . DIABETES MELLITUS, TYPE II 03/16/2008  . HYPERTENSION 01/06/2010  . OBESITY 03/16/2008  . Proliferative diabetic retinopathy(362.02) 04/23/2009  . Fibroid, uterine   . ESRD (end stage renal disease)     ESRD on HD  . Anemia   . CAD (coronary artery disease) 01/2012    a. Cardiac cath done for abn nuc 02/23/2012 - 2 vessel CAD s/p DES to mid-LAD, residual disease in the PL OM for medical therapy.   . End-stage renal disease on peritoneal dialysis 03/12/2012    PD cath placed in Mar 2014 and had tunneled HD cath placed in Jan 2014. Having problems as of Jun '14 with PD cath draining poorly .  PD cath repositioned from LUQ to pelvis by Dr Derrell Lolling on 07/07/12 by lap procedure.     . Shortness of breath     Family Hx Family History  Problem Relation Age of Onset  . Heart disease Mother     MI at age 14  . Diabetes Father   . Kidney failure Father   . Diabetes Sister     1 sister with diabtes  . Diabetes Brother     Social HX History  Substance Use Topics  . Smoking status: Former Smoker    Quit date: 03/12/2004  . Smokeless tobacco: Never Used  . Alcohol Use: Yes     Comment: rarely    Allergies No Known Allergies  Medications Current Outpatient Prescriptions  Medication Sig Dispense Refill  . aspirin EC 81 MG tablet Take 81 mg by mouth daily.      . bisacodyl (DULCOLAX) 5 MG EC tablet Take 5 mg by mouth daily as needed for constipation.      . calcitRIOL (ROCALTROL) 0.5 MCG capsule Take 1 mcg by mouth daily.      . clopidogrel (PLAVIX) 75 MG tablet Take 75 mg by mouth  daily.      . folic acid-vitamin b complex-vitamin c-selenium-zinc (DIALYVITE) 3 MG TABS Take 1 tablet by mouth daily.      . metoprolol succinate (TOPROL-XL) 50 MG 24 hr tablet Take 50 mg by mouth daily.      [name] oxyCODONE (OXY IR/ROXICODONE) 5 MG immediate release tablet Take 1 tablet (5 mg total) by mouth every 6 (six) hours as needed (pain).  20 tablet  0  . polyethylene glycol powder (GLYCOLAX/MIRALAX) powder Take 17 g by mouth daily.      . pravastatin (PRAVACHOL) 40 MG tablet Take 40 mg by mouth every evening.      . sevelamer (RENVELA) 800 MG tablet Take 1,600 mg by mouth 3 (three) times daily with meals. Takes 1600mg  with each meal, and 800mg  with each snack.      [name] zolpidem (AMBIEN) 5 MG tablet take 1 tablet by mouth at bedtime if needed for sleep  30 tablet  0   No current facility-administered medications for this visit.    Labs COAG Lab Results  Component Value Date   INR 0.96 06/20/2012   INR 0.86 02/23/2012   INR 0.99  01/30/2012   No results found for this basename: PTT    PHYSICAL EXAM  There were no vitals filed for this visit.  General:  WDWN in NAD HENT: WNL Eyes: Pupils equal Pulmonary: normal non-labored breathing  Cardiac: RRR, Skin: normal, no cyanosis, jaundice, pallor or bruising Vascular Exam/Pulses: 2+ radial pulses in LEFT upper extremity. Extremities without ischemic changes, no Gangrene , no cellulitis; no open wounds;   There is a good thrill and good bruit in the [facility] AVF. Hand grip is 5/5 and sensation in digits is intact;   Impression: This is a 42 y.o. female who has a functioning HD access.  Plan: Removal of Left IJ HD catheter Lori Greer J 11/19/2012 1:50 PM     [facility] Catheter Removal Procedure Note  Diagnosis: ESRD with Functioning AVF/AVGG  Plan:  Remove left diatek catheter  Consent signed:  yes Time out completed:  yes Coumadin:  no PT/INR (if applicable):   Other labs:   Procedure: 1.   Sterile prepping and draping over catheter area 2. 6 ml 2% lidocaine plain instilled at removal site. 3.  left catheter removed in its entirety with cuff in tact. 4.  Complications: none  5. Tip of catheter sent for culture:  no   Patient tolerated procedure well:  yes Pressure held, no bleeding noted, dressing applied Instructions given to the pt regarding wound care and bleeding.  Other:  Marlowe Sh[place] 11/19/2012 1:49 PM

## 2012-11-27 ENCOUNTER — Ambulatory Visit: Payer: Medicare Other | Admitting: Vascular Surgery

## 2012-11-27 ENCOUNTER — Other Ambulatory Visit (HOSPITAL_COMMUNITY): Payer: Medicare Other

## 2013-02-26 ENCOUNTER — Ambulatory Visit: Payer: Self-pay

## 2013-03-03 ENCOUNTER — Other Ambulatory Visit: Payer: Self-pay | Admitting: Cardiology

## 2013-03-03 ENCOUNTER — Other Ambulatory Visit: Payer: Self-pay | Admitting: Internal Medicine

## 2013-03-04 ENCOUNTER — Ambulatory Visit (INDEPENDENT_AMBULATORY_CARE_PROVIDER_SITE_OTHER): Payer: Medicare Other

## 2013-03-04 VITALS — BP 140/84 | HR 86 | Resp 16

## 2013-03-04 DIAGNOSIS — M79609 Pain in unspecified limb: Secondary | ICD-10-CM

## 2013-03-04 DIAGNOSIS — E1142 Type 2 diabetes mellitus with diabetic polyneuropathy: Secondary | ICD-10-CM

## 2013-03-04 DIAGNOSIS — E1149 Type 2 diabetes mellitus with other diabetic neurological complication: Secondary | ICD-10-CM

## 2013-03-04 DIAGNOSIS — E1140 Type 2 diabetes mellitus with diabetic neuropathy, unspecified: Secondary | ICD-10-CM

## 2013-03-04 MED ORDER — GABAPENTIN 300 MG PO CAPS: 300.0000 mg | ORAL_CAPSULE | Freq: Every day | ORAL | Status: AC

## 2013-03-04 NOTE — Patient Instructions (Signed)
Peripheral Neuropathy Peripheral neuropathy is a type of nerve damage. It affects nerves that carry signals between the spinal cord and other parts of the body. These are called peripheral nerves. With peripheral neuropathy, one nerve or a group of nerves may be damaged.  CAUSES  Many things can damage peripheral nerves. For some people with peripheral neuropathy, the cause is unknown. Some causes include:  Diabetes. This is the most common cause of peripheral neuropathy.  Injury to a nerve.  Pressure or stress on a nerve that lasts a long time.  Too little vitamin B. Alcoholism can lead to this.  Infections.  Autoimmune diseases, such as multiple sclerosis and systemic lupus erythematosus.  Inherited nerve diseases.  Some medicines, such as cancer drugs.  Toxic substances, such as lead and mercury.  Too little blood flowing to the legs.  Kidney disease.  Thyroid disease. SIGNS AND SYMPTOMS  Different people have different symptoms. The symptoms you have will depend on which of your nerves is damaged. Common symptoms include:  Loss of feeling (numbness) in the feet and hands.  Tingling in the feet and hands.  Pain that burns.  Very sensitive skin.  Weakness.  Not being able to move a part of the body (paralysis).  Muscle twitching.  Clumsiness or poor coordination.  Loss of balance.  Not being able to control your bladder.  Feeling dizzy.  Sexual problems. DIAGNOSIS  Peripheral neuropathy is a symptom, not a disease. Finding the cause of peripheral neuropathy can be hard. To figure that out, your health care provider will take a medical history and do a physical exam. A neurological exam will also be done. This involves checking things affected by your brain, spinal cord, and nerves (nervous system). For example, your health care provider will check your reflexes, how you move, and what you can feel.  Other types of tests may also be ordered, such as:  Blood  tests.  A test of the fluid in your spinal cord.  Imaging tests, such as CT scans or an MRI.  Electromyography (EMG). This test checks the nerves that control muscles.  Nerve conduction velocity tests. These tests check how fast messages pass through your nerves.  Nerve biopsy. A small piece of nerve is removed. It is then checked under a microscope. TREATMENT   Medicine is often used to treat peripheral neuropathy. Medicines may include:  Pain-relieving medicines. Prescription or over-the-counter medicine may be suggested.  Antiseizure medicine. This may be used for pain.  Antidepressants. These also may help ease pain from neuropathy.  Lidocaine. This is a numbing medicine. You might wear a patch or be given a shot.  Mexiletine. This medicine is typically used to help control irregular heart rhythms.  Surgery. Surgery may be needed to relieve pressure on a nerve or to destroy a nerve that is causing pain.  Physical therapy to help movement.  Assistive devices to help movement. HOME CARE INSTRUCTIONS   Only take over-the-counter or prescription medicines as directed by your health care provider. Follow the instructions carefully for any given medicines. Do not take any other medicines without first getting approval from your health care provider.  If you have diabetes, work closely with your health care provider to keep your blood sugar under control.  If you have numbness in your feet:  Check every day for signs of injury or infection. Watch for redness, warmth, and swelling.  Wear padded socks and comfortable shoes. These help protect your feet.  Do not do   things that put pressure on your damaged nerve.  Do not smoke. Smoking keeps blood from getting to damaged nerves.  Avoid or limit alcohol. Too much alcohol can cause a lack of B vitamins. These vitamins are needed for healthy nerves.  Develop a good support system. Coping with peripheral neuropathy can be  stressful. Talk to a mental health specialist or join a support group if you are struggling.  Follow up with your health care provider as directed. SEEK MEDICAL CARE IF:   You have new signs or symptoms of peripheral neuropathy.  You are struggling emotionally from dealing with peripheral neuropathy.  You have a fever. SEEK IMMEDIATE MEDICAL CARE IF:   You have an injury or infection that is not healing.  You feel very dizzy or begin vomiting.  You have chest pain.  You have trouble breathing. Document Released: 12/30/2001 Document Revised: 09/21/2010 Document Reviewed: 09/16/2012 [facility] Patient Information 2014 [place]. Diabetes and Foot Care Diabetes may cause you to have problems because of poor blood supply (circulation) to your feet and legs. This may cause the skin on your feet to become thinner, break easier, and heal more slowly. Your skin may become dry, and the skin may peel and crack. You may also have nerve damage in your legs and feet causing decreased feeling in them. You may not notice minor injuries to your feet that could lead to infections or more serious problems. Taking care of your feet is one of the most important things you can do for yourself.  HOME CARE INSTRUCTIONS  Wear shoes at all times, even in the house. Do not go barefoot. Bare feet are easily injured.  Check your feet daily for blisters, cuts, and redness. If you cannot see the bottom of your feet, use a mirror or ask someone for help.  Wash your feet with warm water (do not use hot water) and mild soap. Then pat your feet and the areas between your toes until they are completely dry. Do not soak your feet as this can dry your skin.  Apply a moisturizing lotion or petroleum jelly (that does not contain alcohol and is unscented) to the skin on your feet and to dry, brittle toenails. Do not apply lotion between your toes.  Trim your toenails straight across. Do not dig under them or around the  cuticle. File the edges of your nails with an emery board or nail file.  Do not cut corns or calluses or try to remove them with medicine.  Wear clean socks or stockings every day. Make sure they are not too tight. Do not wear knee-high stockings since they may decrease blood flow to your legs.  Wear shoes that fit properly and have enough cushioning. To break in new shoes, wear them for just a few hours a day. This prevents you from injuring your feet. Always look in your shoes before you put them on to be sure there are no objects inside.  Do not cross your legs. This may decrease the blood flow to your feet.  If you find a minor scrape, cut, or break in the skin on your feet, keep it and the skin around it clean and dry. These areas may be cleansed with mild soap and water. Do not cleanse the area with peroxide, alcohol, or iodine.  When you remove an adhesive bandage, be sure not to damage the skin around it.  If you have a wound, look at it several times a day to  make sure it is healing.  Do not use heating pads or hot water bottles. They may burn your skin. If you have lost feeling in your feet or legs, you may not know it is happening until it is too late.  Make sure your health care provider performs a complete foot exam at least annually or more often if you have foot problems. Report any cuts, sores, or bruises to your health care provider immediately. SEEK MEDICAL CARE IF:   You have an injury that is not healing.  You have cuts or breaks in the skin.  You have an ingrown nail.  You notice redness on your legs or feet.  You feel burning or tingling in your legs or feet.  You have pain or cramps in your legs and feet.  Your legs or feet are numb.  Your feet always feel cold. SEEK IMMEDIATE MEDICAL CARE IF:   There is increasing redness, swelling, or pain in or around a wound.  There is a red line that goes up your leg.  Pus is coming from a wound.  You develop a  fever or as directed by your health care provider.  You notice a bad smell coming from an ulcer or wound. Document Released: 01/07/2000 Document Revised: 09/11/2012 Document Reviewed: 06/18/2012 [facility] Patient Information 2014 [place].

## 2013-03-04 NOTE — Progress Notes (Signed)
   Subjective:    Patient ID: Lori Greer, female    DOB: 1970-12-24, 43 y.o.   MRN: [id number]  HPI Comments: "Dr. Blenda Mounts fixed an ingrown toenail for me and it has still bothered me"  Follow up AP nail procedure (09-13-12) 1st right medial        Review of Systems No new changes or findings noted    Objective:   Physical Exam  Neurovascular status appears to be intact as follows pedal pulses palpable DP postal for bilateral PT plus one over 4 bilateral Refill timed 3-4 seconds all digits skin temperature warm turgor normal there is no edema rubor pallor mild varicosities noted. Patient points to the pain of medial nail fold of the right hallux which 16 months ago underwent a AP nail procedure success at anatomy signs of regrowth no edema no erythema no increased temperature or patient describes some tenderness occasional shooting sensations however on exam and discussion she is also some similar symptoms with numbness and abnormal sensation affecting her both her feet. Keep her awake at night sometimes. There is no open wounds ulcerations no secondary infection is noted no ascending cellulitis posse some thickening and mycosis of the nails which the medial border the right hallux as previously been excised and Nolic signs of regrowth or spicule      Assessment & Plan:  Assessment history of surgery to the right great toenail medial border however no residual abnormalities are identified cannot rule out soft tissue swelling of the nail fold however this time patient symptoms. 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 Patient placed on a regimen gabapentin Dermagraft each bedtime a trial for the next month to 2 months also recommended a cleansing the area to be keeping will Neosporin or lotion along the nail fold. Reappointed in 2 months for possible followup and reassessment of diabetic neuropathy versus paresthesia for a scar tissue of the nail fold.  Harriet Masson DPM

## 2013-05-02 ENCOUNTER — Ambulatory Visit: Payer: Medicare Other

## 2013-05-14 ENCOUNTER — Other Ambulatory Visit: Payer: Self-pay | Admitting: Cardiology

## 2013-08-15 ENCOUNTER — Telehealth: Payer: Self-pay | Admitting: Cardiology

## 2013-08-15 NOTE — Telephone Encounter (Signed)
Rec'd from [place] forward 20 pages to Dr. Stanford Breed

## 2013-08-21 ENCOUNTER — Other Ambulatory Visit: Payer: Self-pay | Admitting: Cardiology

## 2014-01-01 ENCOUNTER — Encounter (HOSPITAL_COMMUNITY): Payer: Self-pay | Admitting: Cardiology

## 2014-12-13 ENCOUNTER — Emergency Department (HOSPITAL_COMMUNITY)
Admission: EM | Admit: 2014-12-13 | Discharge: 2014-12-13 | Disposition: A | Discharge status: Home / Self Care | Payer: Medicare Other | Attending: Emergency Medicine | Admitting: Emergency Medicine

## 2014-12-13 ENCOUNTER — Encounter (HOSPITAL_COMMUNITY): Payer: Self-pay | Admitting: Emergency Medicine

## 2014-12-13 DIAGNOSIS — Z9861 Coronary angioplasty status: Secondary | ICD-10-CM | POA: Insufficient documentation

## 2014-12-13 DIAGNOSIS — E113591 Type 2 diabetes mellitus with proliferative diabetic retinopathy without macular edema, right eye: Secondary | ICD-10-CM | POA: Diagnosis not present

## 2014-12-13 DIAGNOSIS — Z9049 Acquired absence of other specified parts of digestive tract: Secondary | ICD-10-CM | POA: Diagnosis not present

## 2014-12-13 DIAGNOSIS — Z8669 Personal history of other diseases of the nervous system and sense organs: Secondary | ICD-10-CM | POA: Diagnosis not present

## 2014-12-13 DIAGNOSIS — I120 Hypertensive chronic kidney disease with stage 5 chronic kidney disease or end stage renal disease: Secondary | ICD-10-CM | POA: Diagnosis not present

## 2014-12-13 DIAGNOSIS — I2510 Atherosclerotic heart disease of native coronary artery without angina pectoris: Secondary | ICD-10-CM | POA: Insufficient documentation

## 2014-12-13 DIAGNOSIS — D649 Anemia, unspecified: Secondary | ICD-10-CM | POA: Insufficient documentation

## 2014-12-13 DIAGNOSIS — Z992 Dependence on renal dialysis: Secondary | ICD-10-CM | POA: Insufficient documentation

## 2014-12-13 DIAGNOSIS — Z79899 Other long term (current) drug therapy: Secondary | ICD-10-CM | POA: Insufficient documentation

## 2014-12-13 DIAGNOSIS — Z86018 Personal history of other benign neoplasm: Secondary | ICD-10-CM | POA: Diagnosis not present

## 2014-12-13 DIAGNOSIS — Z90710 Acquired absence of both cervix and uterus: Secondary | ICD-10-CM | POA: Diagnosis not present

## 2014-12-13 DIAGNOSIS — Z7982 Long term (current) use of aspirin: Secondary | ICD-10-CM | POA: Insufficient documentation

## 2014-12-13 DIAGNOSIS — N186 End stage renal disease: Secondary | ICD-10-CM | POA: Insufficient documentation

## 2014-12-13 DIAGNOSIS — R1084 Generalized abdominal pain: Secondary | ICD-10-CM | POA: Diagnosis present

## 2014-12-13 DIAGNOSIS — Z87891 Personal history of nicotine dependence: Secondary | ICD-10-CM | POA: Diagnosis not present

## 2014-12-13 DIAGNOSIS — E669 Obesity, unspecified: Secondary | ICD-10-CM | POA: Insufficient documentation

## 2014-12-13 NOTE — Discharge Instructions (Signed)

## 2014-12-13 NOTE — ED Notes (Signed)
Patient undressed, in gown, on continuous pulse oximetry and blood pressure cuff 

## 2014-12-13 NOTE — ED Provider Notes (Signed)
CSN: [id number]     Arrival date & time 12/13/14  [id number] History   First MD Initiated Contact with Patient 12/13/14 [phone]     Chief Complaint  Patient presents with  . Flank Pain     (Consider location/radiation/quality/duration/timing/severity/associated sxs/prior Treatment) Patient is a 44 y.o. female presenting with abdominal pain.  Abdominal Pain Pain location:  Generalized Pain quality: sharp   Pain radiates to:  Does not radiate Pain severity:  Moderate Onset quality:  Gradual Duration:  2 days Timing:  Intermittent Progression:  Unchanged Chronicity:  New Context comment:  Prior renal tx for ESRD Relieved by:  Nothing Worsened by:  Nothing tried Associated symptoms: no anorexia, no cough, no diarrhea, no dysuria, no fever, no hematuria, no nausea and no vomiting     Past Medical History  Diagnosis Date  . Bell's palsy 05/12/2008  . DIABETES MELLITUS, TYPE II 03/16/2008  . HYPERTENSION 01/06/2010  . OBESITY 03/16/2008  . Proliferative diabetic retinopathy(362.02) 04/23/2009  . Fibroid, uterine   . ESRD (end stage renal disease) ([place])     ESRD on HD  . Anemia   . CAD (coronary artery disease) 01/2012    a. Cardiac cath done for abn nuc 02/23/2012 - 2 vessel CAD s/p DES to mid-LAD, residual disease in the PL OM for medical therapy.   . End-stage renal disease on peritoneal dialysis 03/12/2012    PD cath placed in Mar 2014 and had tunneled HD cath placed in Jan 2014. Having problems as of Jun '14 with PD cath draining poorly .  PD cath repositioned from LUQ to pelvis by Dr Rosendo Gros on 07/07/12 by lap procedure.     . Shortness of breath    Past Surgical History  Procedure Laterality Date  . Appendectomy  2012  . Uterine fibroid surgery    . Abdominal hysterectomy    . Abcess drainage  2002    Right axilla  . Insertion of dialysis catheter  January 2014    Right IJ   . Capd insertion Left 04/08/2012    Procedure: LAPAROSCOPIC INSERTION CONTINUOUS AMBULATORY PERITONEAL  DIALYSIS  (CAPD) CATHETER;  Surgeon: Ralene Ok, MD;  Location: [place];  Service: General;  Laterality: Left;  . Laparoscopic repositioning capd catheter N/A 07/08/2012    Procedure:  DIAGNOSTIC LAPAROSCOPY LAPAROSCOPIC REPOSITIONING CAPD CATHETER;  Surgeon: Ralene Ok, MD;  Location: [place];  Service: General;  Laterality: N/A;  . Laparoscopy N/A 07/14/2012    Procedure: LAPAROSCOPY DIAGNOSTIC WITH LYSIS OF ADHESIONS AND REPOSITIONING OF PERITONEAL DIALYSIS CATHETER;  Surgeon: Ralene Ok, MD;  Location: [place];  Service: General;  Laterality: N/A;  . Av fistula placement Left 07/16/2012    Procedure: ARTERIOVENOUS (AV) FISTULA CREATION BRACHIO-CEPHALIC;  Surgeon: Angelia Mould, MD;  Location: [place];  Service: Vascular;  Laterality: Left;  . Insertion of dialysis catheter Left 08/19/2012    Procedure: INSERTION OF DIALYSIS CATHETER Internal Jugular;  Surgeon: Conrad Metairie, MD;  Location: [place];  Service: Vascular;  Laterality: Left;  . Capd removal N/A 08/20/2012    Procedure: CONTINUOUS AMBULATORY PERITONEAL DIALYSIS  (CAPD) CATHETER REMOVAL;  Surgeon: Zenovia Jarred, MD;  Location: [place];  Service: General;  Laterality: N/A;  . Coronary angiogram  02/23/2012    Procedure: CORONARY ANGIOGRAM;  Surgeon: Peter M Martinique, MD;  Location: [facility] CATH LAB;  Service: Cardiovascular;;  . Percutaneous coronary stent intervention (pci-s)  02/23/2012    Procedure: PERCUTANEOUS CORONARY STENT INTERVENTION (PCI-S);  Surgeon: Peter M Martinique, MD;  Location: [place] CATH LAB;  Service: Cardiovascular;;   Family History  Problem Relation Age of Onset  . Heart disease Mother     MI at age 56  . Diabetes Father   . Kidney failure Father   . Diabetes Sister     1 sister with diabtes  . Diabetes Brother    Social History  Substance Use Topics  . Smoking status: Former Smoker    Quit date: 03/12/2004  . Smokeless tobacco: Never Used  . Alcohol Use: Yes     Comment: rarely   OB History    No data  available     Review of Systems  Constitutional: Negative for fever.  Respiratory: Negative for cough.   Gastrointestinal: Positive for abdominal pain. Negative for nausea, vomiting, diarrhea and anorexia.  Genitourinary: Negative for dysuria and hematuria.  All other systems reviewed and are negative.     Allergies  Review of patient's allergies indicates no known allergies.  Home Medications   Prior to Admission medications   Medication Sig Start Date End Date Taking? Authorizing Provider  aspirin EC 81 MG tablet Take 81 mg by mouth daily.    Historical Provider, MD  bisacodyl (DULCOLAX) 5 MG EC tablet Take 5 mg by mouth daily as needed for constipation.    Historical Provider, MD  calcitRIOL (ROCALTROL) 0.5 MCG capsule Take 1 mcg by mouth daily.    Historical Provider, MD  folic acid-vitamin b complex-vitamin c-selenium-zinc (DIALYVITE) 3 MG TABS Take 1 tablet by mouth daily.    Historical Provider, MD  gabapentin (NEURONTIN) 300 MG capsule Take 1 capsule (300 mg total) by mouth at bedtime. 03/04/13   Harriet Masson, DPM  metoprolol succinate (TOPROL-XL) 50 MG 24 hr tablet Take 50 mg by mouth daily. 04/10/12   Lelon Perla, MD  metoprolol succinate (TOPROL-XL) 50 MG 24 hr tablet take 1 tablet by mouth once daily with food    Lelon Perla, MD  oxyCODONE (OXY IR/ROXICODONE) 5 MG immediate release tablet Take 1 tablet (5 mg total) by mouth every 6 (six) hours as needed (pain). 08/21/12   Nishant Dhungel, MD  polyethylene glycol powder (GLYCOLAX/MIRALAX) powder Take 17 g by mouth daily.    Historical Provider, MD  pravastatin (PRAVACHOL) 40 MG tablet Take 40 mg by mouth every evening.    Historical Provider, MD  pravastatin (PRAVACHOL) 40 MG tablet take 1 tablet by mouth every evening 03/03/13   Lelon Perla, MD  sevelamer (RENVELA) 800 MG tablet Take 1,600 mg by mouth 3 (three) times daily with meals. Takes 1600mg  with each meal, and 800mg  with each snack.    Historical  Provider, MD  zolpidem (AMBIEN) 5 MG tablet take 1 tablet by mouth at bedtime if needed for sleep 03/03/13   Marletta Lor, MD   BP 111/61 mmHg  Pulse 81  Temp(Src) 98.4 F (36.9 C) (Oral)  Resp 18  SpO2 98% Physical Exam  Constitutional: She is oriented to person, place, and time. She appears well-developed and well-nourished.  HENT:  Head: Normocephalic and atraumatic.  Right Ear: External ear normal.  Left Ear: External ear normal.  Eyes: Conjunctivae and EOM are normal. Pupils are equal, round, and reactive to light.  Neck: Normal range of motion. Neck supple.  Cardiovascular: Normal rate, regular rhythm, normal heart sounds and intact distal pulses.   Pulmonary/Chest: Effort normal and breath sounds normal.  Abdominal: Soft. Bowel sounds are normal. There is no tenderness.  Musculoskeletal: Normal range of motion.  Neurological: She is alert and oriented to person, place, and time.  Skin: Skin is warm and dry.  Vitals reviewed.   ED Course  Procedures (including critical care time) Labs Review Labs Reviewed - No data to display  Imaging Review No results found. I have personally reviewed and evaluated these images and lab results as part of my medical decision-making.   EKG Interpretation None      MDM   Final diagnoses:  Generalized abdominal pain    44 y.o. female with pertinent PMH of renal tx July 2015 presents with transient migratory abd pain x 2 days.  No fever, gi symptoms.  On arrival vitals and physical exam as above.  Pt essentially asymptomatic at time of exam.  Unable to reproduce pain.  Offered labs, pt states she feels comfortable following up tomorrow after we discussed that transient and migratory pain was very unlikely to signify renal failure, which is pt's primary concern.  DC home in stable condition.    I have reviewed all laboratory and imaging studies if ordered as above  1. Generalized abdominal pain         Debby Freiberg,  MD 12/13/14 [phone]

## 2014-12-13 NOTE — ED Notes (Signed)
Pt to ER with complaint of right side/flank pain. Pt had kidney transplant July of 2015. Pt reports compliance with all medications. Pain began yesterday. Denies fever. Denies urinary difficulty. A/O x4. NAD at current time. VSS.

## 2015-09-13 ENCOUNTER — Emergency Department (HOSPITAL_COMMUNITY)
Admission: EM | Admit: 2015-09-13 | Discharge: 2015-09-13 | Disposition: A | Discharge status: Home / Self Care | Payer: Medicare Other | Attending: Emergency Medicine | Admitting: Emergency Medicine

## 2015-09-13 ENCOUNTER — Emergency Department (HOSPITAL_COMMUNITY): Payer: Medicare Other

## 2015-09-13 ENCOUNTER — Encounter (HOSPITAL_COMMUNITY): Payer: Self-pay | Admitting: Emergency Medicine

## 2015-09-13 DIAGNOSIS — Z79899 Other long term (current) drug therapy: Secondary | ICD-10-CM | POA: Insufficient documentation

## 2015-09-13 DIAGNOSIS — I2510 Atherosclerotic heart disease of native coronary artery without angina pectoris: Secondary | ICD-10-CM | POA: Diagnosis not present

## 2015-09-13 DIAGNOSIS — R1031 Right lower quadrant pain: Secondary | ICD-10-CM | POA: Insufficient documentation

## 2015-09-13 DIAGNOSIS — Z87891 Personal history of nicotine dependence: Secondary | ICD-10-CM | POA: Diagnosis not present

## 2015-09-13 DIAGNOSIS — Z7982 Long term (current) use of aspirin: Secondary | ICD-10-CM | POA: Insufficient documentation

## 2015-09-13 DIAGNOSIS — E1122 Type 2 diabetes mellitus with diabetic chronic kidney disease: Secondary | ICD-10-CM | POA: Insufficient documentation

## 2015-09-13 DIAGNOSIS — N186 End stage renal disease: Secondary | ICD-10-CM | POA: Diagnosis not present

## 2015-09-13 DIAGNOSIS — Z992 Dependence on renal dialysis: Secondary | ICD-10-CM | POA: Insufficient documentation

## 2015-09-13 DIAGNOSIS — R109 Unspecified abdominal pain: Secondary | ICD-10-CM | POA: Diagnosis not present

## 2015-09-13 DIAGNOSIS — I120 Hypertensive chronic kidney disease with stage 5 chronic kidney disease or end stage renal disease: Secondary | ICD-10-CM | POA: Diagnosis not present

## 2015-09-13 LAB — CBC WITH DIFFERENTIAL/PLATELET
Basophils Absolute: 0 10*3/uL (ref 0.0–0.1)
Basophils Relative: 0 %
Eosinophils Absolute: 0.1 10*3/uL (ref 0.0–0.7)
Eosinophils Relative: 2 %
HCT: 37.2 % (ref 36.0–46.0)
Hemoglobin: 12.2 g/dL (ref 12.0–15.0)
Lymphocytes Relative: 33 %
Lymphs Abs: 1.3 10*3/uL (ref 0.7–4.0)
MCH: 28.2 pg (ref 26.0–34.0)
MCHC: 32.8 g/dL (ref 30.0–36.0)
MCV: 85.9 fL (ref 78.0–100.0)
Monocytes Absolute: 0.6 10*3/uL (ref 0.1–1.0)
Monocytes Relative: 14 %
Neutro Abs: 2 10*3/uL (ref 1.7–7.7)
Neutrophils Relative %: 51 %
Platelets: 214 10*3/uL (ref 150–400)
RBC: 4.33 MIL/uL (ref 3.87–5.11)
RDW: 14.4 % (ref 11.5–15.5)
WBC: 4 10*3/uL (ref 4.0–10.5)

## 2015-09-13 LAB — COMPREHENSIVE METABOLIC PANEL
ALT: 8 U/L — ABNORMAL LOW (ref 14–54)
AST: 13 U/L — ABNORMAL LOW (ref 15–41)
Albumin: 4.1 g/dL (ref 3.5–5.0)
Alkaline Phosphatase: 35 U/L — ABNORMAL LOW (ref 38–126)
Anion gap: 4 — ABNORMAL LOW (ref 5–15)
BUN: 20 mg/dL (ref 6–20)
CO2: 23 mmol/L (ref 22–32)
Calcium: 10.2 mg/dL (ref 8.9–10.3)
Chloride: 108 mmol/L (ref 101–111)
Creatinine, Ser: 1.34 mg/dL — ABNORMAL HIGH (ref 0.44–1.00)
GFR calc Af Amer: 54 mL/min — ABNORMAL LOW (ref >60)
GFR calc non Af Amer: 47 mL/min — ABNORMAL LOW (ref >60)
Glucose, Bld: 110 mg/dL — ABNORMAL HIGH (ref 65–99)
Potassium: 3.7 mmol/L (ref 3.5–5.1)
Sodium: 135 mmol/L (ref 135–145)
Total Bilirubin: 0.6 mg/dL (ref 0.3–1.2)
Total Protein: 6.7 g/dL (ref 6.5–8.1)

## 2015-09-13 LAB — URINALYSIS, ROUTINE W REFLEX MICROSCOPIC
Bilirubin Urine: NEGATIVE
Glucose, UA: NEGATIVE mg/dL
Hgb urine dipstick: NEGATIVE
Ketones, ur: NEGATIVE mg/dL
Leukocytes, UA: NEGATIVE
Nitrite: NEGATIVE
Protein, ur: NEGATIVE mg/dL
Specific Gravity, Urine: 1.02 (ref 1.005–1.030)
pH: 5 (ref 5.0–8.0)

## 2015-09-13 MED ORDER — HYDROCODONE-ACETAMINOPHEN 5-325 MG PO TABS
2.0000 | ORAL_TABLET | Freq: Once | ORAL | Status: DC
  Filled 2015-09-13: qty 2

## 2015-09-13 MED ORDER — HYDROCODONE-ACETAMINOPHEN 5-325 MG PO TABS: 1.0000 | ORAL_TABLET | Freq: Four times a day (QID) | ORAL | 0 refills | Status: AC | PRN

## 2015-09-13 NOTE — ED Triage Notes (Signed)
Pt complaint of right flank pain onset a few days ago; denies injury, vaginal, or urinary symptoms. Hx of right kidney transplant.

## 2015-09-13 NOTE — Discharge Instructions (Signed)
Your CT scan showed a large ovarian cyst. Follow-up with your doctor as you may need an ultrasound of this if symptoms do not improve.

## 2015-09-13 NOTE — ED Provider Notes (Addendum)
[facility] Provider Note   CSN: [id number] Arrival date & time: 09/13/15  0750     History   Chief Complaint Chief Complaint  Patient presents with  . Flank Pain    HPI Lori Greer is a 45 y.o. female.  HPI 45 year old female with extensive past medical history including end-stage renal disease due to hypertensive and diabetic nephropathy status post renal transplant who presents with right lower quadrant pain. The patient states that over the last several days. She is having progressive worsening aching right lower quadrant pain. The pain began gradually several days ago. Denies any trauma. She shows as an aching fullness sensation in her right lower quadrant. Denies any associated urinary symptoms including no dysuria, hematuria or urinary frequency. No right flank pain. No fevers or chills. No nausea or vomiting. She has had pain similar to this in the past that resolve spontaneously but given her history of renal transplant, she subsequently presents for evaluation. She has had no recent changes in medications. Denies any vaginal bleeding and she is status post hysterectomy.  Past Medical History:  Diagnosis Date  . Anemia   . Bell's palsy 05/12/2008  . CAD (coronary artery disease) 01/2012   a. Cardiac cath done for abn nuc 02/23/2012 - 2 vessel CAD s/p DES to mid-LAD, residual disease in the PL OM for medical therapy.   [name] DIABETES MELLITUS, TYPE II 03/16/2008  . End-stage renal disease on peritoneal dialysis 03/12/2012   PD cath placed in Mar 2014 and had tunneled HD cath placed in Jan 2014. Having problems as of Jun '14 with PD cath draining poorly .  PD cath repositioned from LUQ to pelvis by Dr Rosendo Gros on 07/07/12 by lap procedure.     [name] ESRD (end stage renal disease) ([place])    ESRD on HD  . Fibroid, uterine   . HYPERTENSION 01/06/2010  . OBESITY 03/16/2008  . Proliferative diabetic retinopathy(362.02) 04/23/2009  . Shortness of breath     Patient Active Problem  List   Diagnosis Date Noted  . Peritonitis in infectious disease ([place]) 08/21/2012  . DM type 2, uncontrolled, with renal complications ([place]) 0000000  . Lactic acidosis 08/16/2012  . Abdominal pain 08/16/2012  . Hypokalemia 08/16/2012  . Nausea and vomiting 08/16/2012  . Dehydration 08/16/2012  . End stage renal disease ([place]) 08/14/2012  . Anemia 07/16/2012  . Secondary hyperparathyroidism (of renal origin) 07/16/2012  . End-stage renal disease on peritoneal dialysis ([place]) 03/12/2012  . CAD (coronary artery disease) 02/24/2012  . Hypertension 01/06/2010  . Proliferative diabetic retinopathy ([place]) 04/23/2009  . Diabetes mellitus ([place]) 03/16/2008  . Obesity 03/16/2008    Past Surgical History:  Procedure Laterality Date  . ABCESS DRAINAGE  2002   Right axilla  . ABDOMINAL HYSTERECTOMY    . APPENDECTOMY  2012  . AV FISTULA PLACEMENT Left 07/16/2012   Procedure: ARTERIOVENOUS (AV) FISTULA CREATION BRACHIO-CEPHALIC;  Surgeon: Angelia Mould, MD;  Location: [facility];  Service: Vascular;  Laterality: Left;  . CAPD INSERTION Left 04/08/2012   Procedure: LAPAROSCOPIC INSERTION CONTINUOUS AMBULATORY PERITONEAL DIALYSIS  (CAPD) CATHETER;  Surgeon: Ralene Ok, MD;  Location: [facility];  Service: General;  Laterality: Left;  . CAPD REMOVAL N/A 08/20/2012   Procedure: CONTINUOUS AMBULATORY PERITONEAL DIALYSIS  (CAPD) CATHETER REMOVAL;  Surgeon: Zenovia Jarred, MD;  Location: [place];  Service: General;  Laterality: N/A;  . CORONARY ANGIOGRAM  02/23/2012   Procedure: CORONARY ANGIOGRAM;  Surgeon: Peter M Martinique, MD;  Location: [facility]  CATH LAB;  Service: Cardiovascular;;  . INSERTION OF DIALYSIS CATHETER  January 2014   Right IJ   . INSERTION OF DIALYSIS CATHETER Left 08/19/2012   Procedure: INSERTION OF DIALYSIS CATHETER Internal Jugular;  Surgeon: Conrad [place] Ridge, MD;  Location: [place];  Service: Vascular;  Laterality: Left;  . LAPAROSCOPIC REPOSITIONING CAPD CATHETER N/A 07/08/2012   Procedure:   DIAGNOSTIC LAPAROSCOPY LAPAROSCOPIC REPOSITIONING CAPD CATHETER;  Surgeon: Ralene Ok, MD;  Location: [place];  Service: General;  Laterality: N/A;  . LAPAROSCOPY N/A 07/14/2012   Procedure: LAPAROSCOPY DIAGNOSTIC WITH LYSIS OF ADHESIONS AND REPOSITIONING OF PERITONEAL DIALYSIS CATHETER;  Surgeon: Ralene Ok, MD;  Location: [place];  Service: General;  Laterality: N/A;  . PERCUTANEOUS CORONARY STENT INTERVENTION (PCI-S)  02/23/2012   Procedure: PERCUTANEOUS CORONARY STENT INTERVENTION (PCI-S);  Surgeon: Peter M Martinique, MD;  Location: [facility] CATH LAB;  Service: Cardiovascular;;  . UTERINE FIBROID SURGERY      OB History    No data available       Home Medications    Prior to Admission medications   Medication Sig Start Date End Date Taking? Authorizing Provider  aspirin EC 81 MG tablet Take 81 mg by mouth daily.   Yes Historical Provider, MD  calcitRIOL (ROCALTROL) 0.25 MCG capsule Take 0.25 mcg by mouth daily. 08/19/15  Yes Historical Provider, MD  Cholecalciferol (VITAMIN D) 2000 units tablet Take 2,000 Units by mouth daily. 08/20/15  Yes Historical Provider, MD  magnesium oxide (MAG-OX) 400 (241.3 Mg) MG tablet Take 400 mg by mouth 2 (two) times daily. 07/28/15  Yes Historical Provider, MD  metoprolol succinate (TOPROL-XL) 50 MG 24 hr tablet take 1 tablet by mouth once daily with food Patient taking differently: take 25 mg by mouth once daily prn if BP >160   Yes Lelon Perla, MD  mycophenolate (MYFORTIC) 180 MG EC tablet Take 540 mg by mouth 2 (two) times daily. 08/09/15  Yes Historical Provider, MD  polyethylene glycol powder (GLYCOLAX/MIRALAX) powder Take 17 g by mouth daily as needed for mild constipation, moderate constipation or severe constipation.    Yes Historical Provider, MD  predniSONE (DELTASONE) 5 MG tablet Take 5 mg by mouth daily. 10/26/14  Yes Historical Provider, MD  senna-docusate (SENOKOT-S) 8.6-50 MG tablet Take 2 tablets by mouth 2 (two) times daily. 09/01/13  Yes  Historical Provider, MD  sitaGLIPtin-metformin (JANUMET) 50-1000 MG tablet Take 1 tablet by mouth 2 (two) times daily. 05/04/15 05/03/16 Yes Historical Provider, MD  tacrolimus (PROGRAF) 1 MG capsule Take 3 mg by mouth 2 (two) times daily. 08/20/15  Yes Historical Provider, MD  gabapentin (NEURONTIN) 300 MG capsule Take 1 capsule (300 mg total) by mouth at bedtime. Patient not taking: Reported on 09/13/2015 03/04/13   Harriet Masson, DPM  HYDROcodone-acetaminophen (NORCO/VICODIN) 5-325 MG tablet Take 1 tablet by mouth every 6 (six) hours as needed for severe pain. 09/13/15   Duffy Bruce, MD  oxyCODONE (OXY IR/ROXICODONE) 5 MG immediate release tablet Take 1 tablet (5 mg total) by mouth every 6 (six) hours as needed (pain). Patient not taking: Reported on 09/13/2015 08/21/12   Nishant Dhungel, MD  pravastatin (PRAVACHOL) 40 MG tablet take 1 tablet by mouth every evening Patient not taking: Reported on 09/13/2015 03/03/13   Lelon Perla, MD  zolpidem (AMBIEN) 5 MG tablet take 1 tablet by mouth at bedtime if needed for sleep Patient not taking: Reported on 09/13/2015 03/03/13   Marletta Lor, MD    Family History Family History  Problem Relation Age of Onset  . Heart disease Mother     MI at age 31  . Diabetes Father   . Kidney failure Father   . Diabetes Sister     1 sister with diabtes  . Diabetes Brother     Social History Social History  Substance Use Topics  . Smoking status: Former Smoker    Quit date: 03/12/2004  . Smokeless tobacco: Never Used  . Alcohol use Yes     Comment: rarely     Allergies   Review of patient's allergies indicates no known allergies.   Review of Systems Review of Systems  Constitutional: Negative for chills and fever.  HENT: Negative for congestion, rhinorrhea and sore throat.   Eyes: Negative for visual disturbance.  Respiratory: Negative for cough, shortness of breath and wheezing.   Cardiovascular: Negative for chest pain and leg swelling.    Gastrointestinal: Positive for abdominal pain. Negative for diarrhea, nausea and vomiting.  Genitourinary: Positive for flank pain. Negative for dysuria, vaginal bleeding and vaginal discharge.  Musculoskeletal: Negative for neck pain.  Skin: Negative for rash.  Allergic/Immunologic: Negative for immunocompromised state.  Neurological: Negative for syncope and headaches.  Hematological: Does not bruise/bleed easily.  All other systems reviewed and are negative.    Physical Exam Updated Vital Signs BP 121/60   Pulse 64   Temp 98.1 F (36.7 C) (Oral)   Resp 19   SpO2 100%   Physical Exam  Constitutional: She is oriented to person, place, and time. She appears well-developed and well-nourished. No distress.  HENT:  Head: Normocephalic and atraumatic.  Eyes: Conjunctivae are normal.  Neck: Neck supple.  Cardiovascular: Normal rate, regular rhythm and normal heart sounds.  Exam reveals no friction rub.   No murmur heard. Pulmonary/Chest: Effort normal and breath sounds normal. No respiratory distress. She has no wheezes. She has no rales.  Abdominal: She exhibits no distension. There is tenderness (Mild, right lower quadrant. Surgical site appears clean, dry and intact. No warmth or erythema.). There is no rebound and no guarding.  Musculoskeletal: She exhibits no edema.  Neurological: She is alert and oriented to person, place, and time. She exhibits normal muscle tone.  Skin: Skin is warm. Capillary refill takes less than 2 seconds.  Psychiatric: She has a normal mood and affect.  Nursing note and vitals reviewed.    ED Treatments / Results  Labs (all labs ordered are listed, but only abnormal results are displayed) Labs Reviewed  COMPREHENSIVE METABOLIC PANEL - Abnormal; Notable for the following:       Result Value   Glucose, Bld 110 (*)    Creatinine, Ser 1.34 (*)    AST 13 (*)    ALT 8 (*)    Alkaline Phosphatase 35 (*)    GFR calc non Af Amer 47 (*)    GFR calc  Af Amer 54 (*)    Anion gap 4 (*)    All other components within normal limits  URINALYSIS, ROUTINE W REFLEX MICROSCOPIC (NOT AT [facility]) - Abnormal; Notable for the following:    APPearance CLOUDY (*)    All other components within normal limits  URINE CULTURE  CBC WITH DIFFERENTIAL/PLATELET  TACROLIMUS LEVEL    EKG  EKG Interpretation None       Radiology Ct Abdomen Pelvis Wo Contrast  Result Date: 09/13/2015 CLINICAL DATA:  Right flank pain. History of right kidney transplant 2 years ago. EXAM: CT ABDOMEN AND PELVIS WITHOUT CONTRAST TECHNIQUE: Multidetector  CT imaging of the abdomen and pelvis was performed following the standard protocol without IV contrast. COMPARISON:  CT abdomen pelvis 08/16/2012 FINDINGS: Lower chest:  No acute findings. Hepatobiliary: No mass visualized on this un-enhanced exam. Pancreas: No mass or inflammatory process identified on this un-enhanced exam. Spleen: Within normal limits in size. Adrenals/Urinary Tract: The native kidneys have atrophic appearance. There is a transplanted kidney in the right pelvis without evidence of hydronephrosis or nephrolithiasis. Stomach/Bowel: No evidence of obstruction, inflammatory process, or abnormal fluid collections. Vascular/Lymphatic: No pathologically enlarged lymph nodes. No evidence of abdominal aortic aneurysm. Reproductive: Post hysterectomy. A cystic structure in the mid pelvis abutting the transplanted kidney, measuring 3.2 cm likely represents the right adnexa containing a 3.1 cm cyst. Urinary bladder is decompressed and therefore incompletely evaluated. Other: Fat stranding in the lower anterior abdominal wall mass likely postsurgical. Musculoskeletal: No suspicious bone lesions identified. 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 IMPRESSION: Right pelvic kidney transplant without evidence of hydronephrosis or nephrolithiasis. Probable 3.1 cm right adnexal cyst, displaced to the central pelvis by in  the transplanted kidney. Renal osteodystrophy of the thoracic spine. Electronically Signed   By: Fidela Salisbury M.D.   On: 09/13/2015 10:03    Procedures Procedures (including critical care time)  Medications Ordered in ED Medications - No data to display   Initial Impression / Assessment and Plan / ED Course  I have reviewed the triage vital signs and the nursing notes.  Pertinent labs & imaging results that were available during my care of the patient were reviewed by me and considered in my medical decision making (see chart for details).  Clinical Course  45 year old female with extensive past medical history including end-stage renal disease who presents with a several day history of gradual onset and progressively worsening right lower quadrant pain. See history of present illness above. No fevers or chills. On arrival, vital signs are stable and within normal limits. Examination is as above. The patient is overall very well-appearing and in no acute distress. Initial differential is broad. Primary concern is transplant complication or urinary tract infection with transplant infection. However, patient has no fevers, nausea, vomiting, or signs of systemic illness at this time. Differential also includes possible ovarian pathology. She is status post hysterectomy. No vaginal bleeding or discharge. Otherwise, pain is less consistent with possible renal stone. Abdomen is otherwise soft without peritonitis. We will obtain screening lab work and CT abdomen and pelvis for further assessment.  Labs and imaging reviewed as above. CBC is unremarkable with normal white blood cell count and hemoglobin. CMP is also at baseline with creatinine 1.34. Per review of outside hospital records is the patient's baseline. Urinalysis shows no evidence of UTI or hematuria. CT abdomen and pelvis reviewed and shows a right ovarian cyst but otherwise no acute on amount. There is no hydronephrosis or stranding  around the transplanted kidney.  Based on patient's reassuring labs and exam, I suspect the patient's pain is secondary to possible ovarian cyst versus musculoskeletal pain. The pain began gradually, is not colicky, and is not consistent with ovarian torsion. Moreover, the patient is status post hysterectomy. I see no signs of acute transplant rejection or infection. Discussed lab work and imaging with the patient. She is reassured. Will treat for pain related to cyst with short course of analgesics and advise close outpatient follow-up for possible ultrasound. Otherwise, Prograf level was sent and she will follow up with this as an outpatient. She will notify  her transplant physician of her current ED visit. Return precautions given.  Final Clinical Impressions(s) / ED Diagnoses   Final diagnoses:  Right flank pain  RLQ abdominal pain    New Prescriptions Discharge Medication List as of 09/13/2015 10:39 AM    START taking these medications   Details  HYDROcodone-acetaminophen (NORCO/VICODIN) 5-325 MG tablet Take 1 tablet by mouth every 6 (six) hours as needed for severe pain., Starting Mon 09/13/2015, Print         Duffy Bruce, MD 09/14/15 [id number]    Duffy Bruce, MD 09/14/15 [phone]

## 2015-09-13 NOTE — ED Notes (Signed)
Lab called to verify correct tube for Tacrolimus level. Lab sts dark green, dark green sent.

## 2015-09-14 LAB — URINE CULTURE

## 2015-09-15 LAB — TACROLIMUS LEVEL: Tacrolimus (FK506) - LabCorp: 25.5 ng/mL — ABNORMAL HIGH (ref 2.0–20.0)

## 2015-09-18 ENCOUNTER — Encounter (HOSPITAL_COMMUNITY): Payer: Self-pay | Admitting: Emergency Medicine

## 2015-09-18 ENCOUNTER — Emergency Department (HOSPITAL_COMMUNITY)
Admission: EM | Admit: 2015-09-18 | Discharge: 2015-09-18 | Disposition: A | Discharge status: Home / Self Care | Payer: Medicare Other | Attending: Emergency Medicine | Admitting: Emergency Medicine

## 2015-09-18 DIAGNOSIS — N186 End stage renal disease: Secondary | ICD-10-CM | POA: Insufficient documentation

## 2015-09-18 DIAGNOSIS — L259 Unspecified contact dermatitis, unspecified cause: Secondary | ICD-10-CM | POA: Diagnosis not present

## 2015-09-18 DIAGNOSIS — Z992 Dependence on renal dialysis: Secondary | ICD-10-CM | POA: Insufficient documentation

## 2015-09-18 DIAGNOSIS — Z7982 Long term (current) use of aspirin: Secondary | ICD-10-CM | POA: Diagnosis not present

## 2015-09-18 DIAGNOSIS — E1122 Type 2 diabetes mellitus with diabetic chronic kidney disease: Secondary | ICD-10-CM | POA: Diagnosis not present

## 2015-09-18 DIAGNOSIS — I120 Hypertensive chronic kidney disease with stage 5 chronic kidney disease or end stage renal disease: Secondary | ICD-10-CM | POA: Diagnosis not present

## 2015-09-18 DIAGNOSIS — Z79899 Other long term (current) drug therapy: Secondary | ICD-10-CM | POA: Insufficient documentation

## 2015-09-18 DIAGNOSIS — R21 Rash and other nonspecific skin eruption: Secondary | ICD-10-CM | POA: Diagnosis present

## 2015-09-18 DIAGNOSIS — Z87891 Personal history of nicotine dependence: Secondary | ICD-10-CM | POA: Insufficient documentation

## 2015-09-18 DIAGNOSIS — I2510 Atherosclerotic heart disease of native coronary artery without angina pectoris: Secondary | ICD-10-CM | POA: Insufficient documentation

## 2015-09-18 MED ORDER — TRIAMCINOLONE ACETONIDE 0.1 % EX CREA: 1.0000 "application " | TOPICAL_CREAM | Freq: Two times a day (BID) | CUTANEOUS | 0 refills | Status: AC

## 2015-09-18 NOTE — ED Provider Notes (Signed)
[facility] DEPT Provider Note   CSN: [id number] Arrival date & time: 09/18/15  [id number]     History   Chief Complaint Chief Complaint  Patient presents with  . Rash    HPI Lori Greer is a 45 y.o. female.  HPI   45 year old female with history of diabetes, end-stage renal disease currently on dialysis, hypertension, obesity presenting to the ED for evaluation of a rash. Patient reported itchy rash to her left upper arm ongoing for the past week. Rash has been persistent, not improve despite using over-the-counter cortisone 10 on a consistent basis. Rash has not spread and no one else at home with similar rash. She denies any associated fever, headache, or lesion, trouble breathing, chest discomfort, abdominal discomfort. She does not have a history of shingle. She denies any potential exposures such as change in soap detergent or body wash. She denies any recent medication changes or having new pets. No recent tick exposure.  Past Medical History:  Diagnosis Date  . Anemia   . Bell's palsy 05/12/2008  . CAD (coronary artery disease) 01/2012   a. Cardiac cath done for abn nuc 02/23/2012 - 2 vessel CAD s/p DES to mid-LAD, residual disease in the PL OM for medical therapy.   Lori Greer DIABETES MELLITUS, TYPE II 03/16/2008  . End-stage renal disease on peritoneal dialysis 03/12/2012   PD cath placed in Mar 2014 and had tunneled HD cath placed in Jan 2014. Having problems as of Jun '14 with PD cath draining poorly .  PD cath repositioned from LUQ to pelvis by Dr Rosendo Gros on 07/07/12 by lap procedure.     Lori Greer ESRD (end stage renal disease) ([place])    ESRD on HD  . Fibroid, uterine   . HYPERTENSION 01/06/2010  . OBESITY 03/16/2008  . Proliferative diabetic retinopathy(362.02) 04/23/2009  . Shortness of breath     Patient Active Problem List   Diagnosis Date Noted  . Peritonitis in infectious disease ([place]) 08/21/2012  . DM type 2, uncontrolled, with renal complications ([place]) 0000000  . Lactic  acidosis 08/16/2012  . Abdominal pain 08/16/2012  . Hypokalemia 08/16/2012  . Nausea and vomiting 08/16/2012  . Dehydration 08/16/2012  . End stage renal disease ([place]) 08/14/2012  . Anemia 07/16/2012  . Secondary hyperparathyroidism (of renal origin) 07/16/2012  . End-stage renal disease on peritoneal dialysis ([place]) 03/12/2012  . CAD (coronary artery disease) 02/24/2012  . Hypertension 01/06/2010  . Proliferative diabetic retinopathy ([place]) 04/23/2009  . Diabetes mellitus ([place]) 03/16/2008  . Obesity 03/16/2008    Past Surgical History:  Procedure Laterality Date  . ABCESS DRAINAGE  2002   Right axilla  . ABDOMINAL HYSTERECTOMY    . APPENDECTOMY  2012  . AV FISTULA PLACEMENT Left 07/16/2012   Procedure: ARTERIOVENOUS (AV) FISTULA CREATION BRACHIO-CEPHALIC;  Surgeon: Angelia Mould, MD;  Location: [facility];  Service: Vascular;  Laterality: Left;  . CAPD INSERTION Left 04/08/2012   Procedure: LAPAROSCOPIC INSERTION CONTINUOUS AMBULATORY PERITONEAL DIALYSIS  (CAPD) CATHETER;  Surgeon: Ralene Ok, MD;  Location: [facility];  Service: General;  Laterality: Left;  . CAPD REMOVAL N/A 08/20/2012   Procedure: CONTINUOUS AMBULATORY PERITONEAL DIALYSIS  (CAPD) CATHETER REMOVAL;  Surgeon: Zenovia Jarred, MD;  Location: [facility];  Service: General;  Laterality: N/A;  . CORONARY ANGIOGRAM  02/23/2012   Procedure: CORONARY ANGIOGRAM;  Surgeon: Peter M Martinique, MD;  Location: [facility] CATH LAB;  Service: Cardiovascular;;  . INSERTION OF DIALYSIS CATHETER  January 2014   Right IJ   .  INSERTION OF DIALYSIS CATHETER Left 08/19/2012   Procedure: INSERTION OF DIALYSIS CATHETER Internal Jugular;  Surgeon: Conrad Yazoo, MD;  Location: [facility];  Service: Vascular;  Laterality: Left;  . LAPAROSCOPIC REPOSITIONING CAPD CATHETER N/A 07/08/2012   Procedure:  DIAGNOSTIC LAPAROSCOPY LAPAROSCOPIC REPOSITIONING CAPD CATHETER;  Surgeon: Ralene Ok, MD;  Location: [facility];  Service: General;  Laterality: N/A;  . LAPAROSCOPY  N/A 07/14/2012   Procedure: LAPAROSCOPY DIAGNOSTIC WITH LYSIS OF ADHESIONS AND REPOSITIONING OF PERITONEAL DIALYSIS CATHETER;  Surgeon: Ralene Ok, MD;  Location: [facility];  Service: General;  Laterality: N/A;  . PERCUTANEOUS CORONARY STENT INTERVENTION (PCI-S)  02/23/2012   Procedure: PERCUTANEOUS CORONARY STENT INTERVENTION (PCI-S);  Surgeon: Peter M Martinique, MD;  Location[facility] CATH LAB;  Service: Cardiovascular;;  . UTERINE FIBROID SURGERY      OB History    No data available       Home Medications    Prior to Admission medications   Medication Sig Start Date End Date Taking? Authorizing Provider  aspirin EC 81 MG tablet Take 81 mg by mouth daily.    Historical Provider, MD  calcitRIOL (ROCALTROL) 0.25 MCG capsule Take 0.25 mcg by mouth daily. 08/19/15   Historical Provider, MD  Cholecalciferol (VITAMIN D) 2000 units tablet Take 2,000 Units by mouth daily. 08/20/15   Historical Provider, MD  gabapentin (NEURONTIN) 300 MG capsule Take 1 capsule (300 mg total) by mouth at bedtime. Patient not taking: Reported on 09/13/2015 03/04/13   Harriet Masson, DPM  HYDROcodone-acetaminophen (NORCO/VICODIN) 5-325 MG tablet Take 1 tablet by mouth every 6 (six) hours as needed for severe pain. 09/13/15   Duffy Bruce, MD  magnesium oxide (MAG-OX) 400 (241.3 Mg) MG tablet Take 400 mg by mouth 2 (two) times daily. 07/28/15   Historical Provider, MD  metoprolol succinate (TOPROL-XL) 50 MG 24 hr tablet take 1 tablet by mouth once daily with food Patient taking differently: take 25 mg by mouth once daily prn if BP >160    Lelon Perla, MD  mycophenolate (MYFORTIC) 180 MG EC tablet Take 540 mg by mouth 2 (two) times daily. 08/09/15   Historical Provider, MD  oxyCODONE (OXY IR/ROXICODONE) 5 MG immediate release tablet Take 1 tablet (5 mg total) by mouth every 6 (six) hours as needed (pain). Patient not taking: Reported on 09/13/2015 08/21/12   Nishant Dhungel, MD  polyethylene glycol powder (GLYCOLAX/MIRALAX)  powder Take 17 g by mouth daily as needed for mild constipation, moderate constipation or severe constipation.     Historical Provider, MD  pravastatin (PRAVACHOL) 40 MG tablet take 1 tablet by mouth every evening Patient not taking: Reported on 09/13/2015 03/03/13   Lelon Perla, MD  predniSONE (DELTASONE) 5 MG tablet Take 5 mg by mouth daily. 10/26/14   Historical Provider, MD  senna-docusate (SENOKOT-S) 8.6-50 MG tablet Take 2 tablets by mouth 2 (two) times daily. 09/01/13   Historical Provider, MD  sitaGLIPtin-metformin (JANUMET) 50-1000 MG tablet Take 1 tablet by mouth 2 (two) times daily. 05/04/15 05/03/16  Historical Provider, MD  tacrolimus (PROGRAF) 1 MG capsule Take 3 mg by mouth 2 (two) times daily. 08/20/15   Historical Provider, MD  zolpidem (AMBIEN) 5 MG tablet take 1 tablet by mouth at bedtime if needed for sleep Patient not taking: Reported on 09/13/2015 03/03/13   Marletta Lor, MD    Family History Family History  Problem Relation Age of Onset  . Heart disease Mother     MI at age 19  .  Diabetes Father   . Kidney failure Father   . Diabetes Sister     1 sister with diabtes  . Diabetes Brother     Social History Social History  Substance Use Topics  . Smoking status: Former Smoker    Quit date: 03/12/2004  . Smokeless tobacco: Never Used  . Alcohol use Yes     Comment: rarely     Allergies   Review of patient's allergies indicates no known allergies.   Review of Systems Review of Systems  Constitutional: Negative for fever.  Skin: Positive for rash.  Neurological: Negative for numbness and headaches.     Physical Exam Updated Vital Signs BP 122/70 (BP Location: Right Arm)   Pulse 82   Temp 98.4 F (36.9 C) (Oral)   Resp 16   SpO2 100%   Physical Exam  Constitutional: She appears well-developed and well-nourished. No distress.  HENT:  Head: Atraumatic.  Mouth/Throat: Oropharynx is clear and moist.  Eyes: Conjunctivae are normal.  Neck: Neck  supple.  Cardiovascular: Normal rate and regular rhythm.   Pulmonary/Chest: Effort normal and breath sounds normal.  Neurological: She is alert.  Skin: No rash (Patient has multiple raised erythematous papule noted to the left upper arm on the lateral aspects, blanchable without any true vesicular, pustular, or petechial lesion. ) noted.  Psychiatric: She has a normal mood and affect.  Nursing note and vitals reviewed.    ED Treatments / Results  Labs (all labs ordered are listed, but only abnormal results are displayed) Labs Reviewed - No data to display  EKG  EKG Interpretation None       Radiology No results found.  Procedures Procedures (including critical care time)  Medications Ordered in ED Medications - No data to display   Initial Impression / Assessment and Plan / ED Course  I have reviewed the triage vital signs and the nursing notes.  Pertinent labs & imaging results that were available during my care of the patient were reviewed by me and considered in my medical decision making (see chart for details).  Clinical Course    BP 122/70 (BP Location: Right Arm)   Pulse 82   Temp 98.4 F (36.9 C) (Oral)   Resp 16   SpO2 100%    Final Clinical Impressions(s) / ED Diagnoses   Final diagnoses:  None    New Prescriptions New Prescriptions   No medications on file   9:08 AM Patient presents with itchy rash to her left upper arm suggestive of contact dermatitis of unknown source. At this time I do not appreciate any vesicular lesion and patient does not have any significant pain and therefore I have low suspicion for shingle. No anaphylactic presentation. Patient's resting comfortably. Plan to treat patient with mid potency steroid cream and outpatient follow-up. Return precaution discussed.   Domenic Moras, PA-C 09/18/15 [id number]    Margette Fast, MD 09/18/15 [phone]

## 2015-09-18 NOTE — ED Triage Notes (Signed)
Pt c/o Rash on L upper arm x 1 week. Pt has been using cortisone 10 ointment without relief. Pt c/o itchiness at area. A&Ox4 and ambulatory.

## 2016-03-22 DIAGNOSIS — N390 Urinary tract infection, site not specified: Secondary | ICD-10-CM | POA: Diagnosis not present

## 2016-03-22 DIAGNOSIS — Z940 Kidney transplant status: Secondary | ICD-10-CM | POA: Diagnosis not present

## 2016-03-22 DIAGNOSIS — Z9483 Pancreas transplant status: Secondary | ICD-10-CM | POA: Diagnosis not present

## 2016-03-22 DIAGNOSIS — Z5181 Encounter for therapeutic drug level monitoring: Secondary | ICD-10-CM | POA: Diagnosis not present

## 2016-05-02 ENCOUNTER — Encounter (HOSPITAL_COMMUNITY): Payer: Self-pay | Admitting: Emergency Medicine

## 2016-05-02 ENCOUNTER — Emergency Department (HOSPITAL_COMMUNITY)
Admission: EM | Admit: 2016-05-02 | Discharge: 2016-05-02 | Disposition: A | Discharge status: Home / Self Care | Payer: Medicare Other | Attending: Emergency Medicine | Admitting: Emergency Medicine

## 2016-05-02 DIAGNOSIS — E1122 Type 2 diabetes mellitus with diabetic chronic kidney disease: Secondary | ICD-10-CM | POA: Diagnosis not present

## 2016-05-02 DIAGNOSIS — Z7982 Long term (current) use of aspirin: Secondary | ICD-10-CM | POA: Diagnosis not present

## 2016-05-02 DIAGNOSIS — Z6835 Body mass index (BMI) 35.0-35.9, adult: Secondary | ICD-10-CM | POA: Diagnosis not present

## 2016-05-02 DIAGNOSIS — Z940 Kidney transplant status: Secondary | ICD-10-CM | POA: Diagnosis not present

## 2016-05-02 DIAGNOSIS — N182 Chronic kidney disease, stage 2 (mild): Secondary | ICD-10-CM | POA: Diagnosis not present

## 2016-05-02 DIAGNOSIS — Z87891 Personal history of nicotine dependence: Secondary | ICD-10-CM | POA: Diagnosis not present

## 2016-05-02 DIAGNOSIS — N186 End stage renal disease: Secondary | ICD-10-CM | POA: Insufficient documentation

## 2016-05-02 DIAGNOSIS — E782 Mixed hyperlipidemia: Secondary | ICD-10-CM | POA: Diagnosis not present

## 2016-05-02 DIAGNOSIS — E0842 Diabetes mellitus due to underlying condition with diabetic polyneuropathy: Secondary | ICD-10-CM | POA: Diagnosis not present

## 2016-05-02 DIAGNOSIS — E1165 Type 2 diabetes mellitus with hyperglycemia: Secondary | ICD-10-CM | POA: Diagnosis not present

## 2016-05-02 DIAGNOSIS — I2510 Atherosclerotic heart disease of native coronary artery without angina pectoris: Secondary | ICD-10-CM | POA: Insufficient documentation

## 2016-05-02 DIAGNOSIS — Z79899 Other long term (current) drug therapy: Secondary | ICD-10-CM | POA: Diagnosis not present

## 2016-05-02 DIAGNOSIS — N2581 Secondary hyperparathyroidism of renal origin: Secondary | ICD-10-CM | POA: Diagnosis not present

## 2016-05-02 DIAGNOSIS — E118 Type 2 diabetes mellitus with unspecified complications: Secondary | ICD-10-CM | POA: Diagnosis not present

## 2016-05-02 DIAGNOSIS — I120 Hypertensive chronic kidney disease with stage 5 chronic kidney disease or end stage renal disease: Secondary | ICD-10-CM | POA: Insufficient documentation

## 2016-05-02 DIAGNOSIS — R739 Hyperglycemia, unspecified: Secondary | ICD-10-CM

## 2016-05-02 DIAGNOSIS — D631 Anemia in chronic kidney disease: Secondary | ICD-10-CM | POA: Diagnosis not present

## 2016-05-02 DIAGNOSIS — R790 Abnormal level of blood mineral: Secondary | ICD-10-CM | POA: Diagnosis not present

## 2016-05-02 DIAGNOSIS — I129 Hypertensive chronic kidney disease with stage 1 through stage 4 chronic kidney disease, or unspecified chronic kidney disease: Secondary | ICD-10-CM | POA: Diagnosis not present

## 2016-05-02 LAB — CBC
HCT: 37.9 % (ref 36.0–46.0)
Hemoglobin: 12.9 g/dL (ref 12.0–15.0)
MCH: 29.1 pg (ref 26.0–34.0)
MCHC: 34 g/dL (ref 30.0–36.0)
MCV: 85.4 fL (ref 78.0–100.0)
Platelets: 221 10*3/uL (ref 150–400)
RBC: 4.44 MIL/uL (ref 3.87–5.11)
RDW: 13 % (ref 11.5–15.5)
WBC: 5.5 10*3/uL (ref 4.0–10.5)

## 2016-05-02 LAB — CBG MONITORING, ED
Glucose-Capillary: 586 mg/dL (ref 65–99)
Glucose-Capillary: 429 mg/dL — ABNORMAL HIGH (ref 65–99)
Glucose-Capillary: 326 mg/dL — ABNORMAL HIGH (ref 65–99)

## 2016-05-02 LAB — BASIC METABOLIC PANEL
Anion gap: 9 (ref 5–15)
BUN: 27 mg/dL — ABNORMAL HIGH (ref 6–20)
CO2: 22 mmol/L (ref 22–32)
Calcium: 10 mg/dL (ref 8.9–10.3)
Chloride: 99 mmol/L — ABNORMAL LOW (ref 101–111)
Creatinine, Ser: 1.49 mg/dL — ABNORMAL HIGH (ref 0.44–1.00)
GFR calc Af Amer: 48 mL/min — ABNORMAL LOW (ref >60)
GFR calc non Af Amer: 41 mL/min — ABNORMAL LOW (ref >60)
Glucose, Bld: 571 mg/dL (ref 65–99)
Potassium: 4.4 mmol/L (ref 3.5–5.1)
Sodium: 130 mmol/L — ABNORMAL LOW (ref 135–145)

## 2016-05-02 LAB — URINALYSIS, ROUTINE W REFLEX MICROSCOPIC
Bacteria, UA: NONE SEEN
Bilirubin Urine: NEGATIVE
Glucose, UA: >=500 mg/dL — AB
Hgb urine dipstick: NEGATIVE
Ketones, ur: NEGATIVE mg/dL
Leukocytes, UA: NEGATIVE
Nitrite: NEGATIVE
Protein, ur: NEGATIVE mg/dL
RBC / HPF: NONE SEEN RBC/hpf (ref 0–5)
Specific Gravity, Urine: 1.023 (ref 1.005–1.030)
pH: 5 (ref 5.0–8.0)

## 2016-05-02 MED ORDER — SODIUM CHLORIDE 0.9 % IV BOLUS (SEPSIS)
1000.0000 mL | Freq: Once | INTRAVENOUS | Status: AC
  Administered 2016-05-02: 1000 mL via INTRAVENOUS

## 2016-05-02 MED ORDER — INSULIN ASPART 100 UNIT/ML ~~LOC~~ SOLN
7.0000 [IU] | Freq: Once | SUBCUTANEOUS | Status: AC
  Administered 2016-05-02: 7 [IU] via SUBCUTANEOUS
  Filled 2016-05-02: qty 1

## 2016-05-02 NOTE — ED Triage Notes (Signed)
Patient states that her PCP sent her here due to blood sugar CBG 702. Patient states that she hasnt had her medications today and ate large breakfast.

## 2016-05-02 NOTE — ED Notes (Signed)
Bed: [facility]PT1 Expected date:  Expected time:  Means of arrival:  Comments: 

## 2016-05-02 NOTE — ED Provider Notes (Signed)
[facility] DEPT Provider Note   CSN: [id number] Arrival date & time: 05/02/16  1501     History   Chief Complaint Chief Complaint  Patient presents with  . Hyperglycemia    HPI Lori Greer is a 46 y.o. female.  The history is provided by the patient and medical records. No language interpreter was used.  Hyperglycemia  Associated symptoms: no abdominal pain, no chest pain, no dizziness, no dysuria, no fever, no nausea, no shortness of breath, no vomiting and no weakness    Lori Greer is a 46 y.o. female  with a PMH of DM2 who presents to the Emergency Department from [facility] for hyperglycemia. Per patient, she went to PCP today as a routine follow up appointment with no complaints. CBG was 702. Patient states that she did eat breakfast about an hour or so before appointment: grits, eggs, sausage, bacon and large coffee with cream and sugar. Patient states that she typically has sugars in the 130's before meals. No recent illness. On Janumet which she takes BID. She states that she did not take dose this morning, but has not missed any doses this week until this morning.    Past Medical History:  Diagnosis Date  . Anemia   . Bell's palsy 05/12/2008  . CAD (coronary artery disease) 01/2012   a. Cardiac cath done for abn nuc 02/23/2012 - 2 vessel CAD s/p DES to mid-LAD, residual disease in the PL OM for medical therapy.   [name] DIABETES MELLITUS, TYPE II 03/16/2008  . End-stage renal disease on peritoneal dialysis 03/12/2012   PD cath placed in Mar 2014 and had tunneled HD cath placed in Jan 2014. Having problems as of Jun '14 with PD cath draining poorly .  PD cath repositioned from LUQ to pelvis by Dr Rosendo Gros on 07/07/12 by lap procedure.     [name] ESRD (end stage renal disease) ([place])    ESRD on HD  . Fibroid, uterine   . HYPERTENSION 01/06/2010  . OBESITY 03/16/2008  . Proliferative diabetic retinopathy(362.02) 04/23/2009  . Shortness of breath     Patient Active Problem List   Diagnosis Date Noted  . Peritonitis in infectious disease ([place]) 08/21/2012  . DM type 2, uncontrolled, with renal complications ([place]) 76/72/0947  . Lactic acidosis 08/16/2012  . Abdominal pain 08/16/2012  . Hypokalemia 08/16/2012  . Nausea and vomiting 08/16/2012  . Dehydration 08/16/2012  . End stage renal disease ([place]) 08/14/2012  . Anemia 07/16/2012  . Secondary hyperparathyroidism (of renal origin) 07/16/2012  . End-stage renal disease on peritoneal dialysis ([place]) 03/12/2012  . CAD (coronary artery disease) 02/24/2012  . Hypertension 01/06/2010  . Proliferative diabetic retinopathy ([place]) 04/23/2009  . Diabetes mellitus ([place]) 03/16/2008  . Obesity 03/16/2008    Past Surgical History:  Procedure Laterality Date  . ABCESS DRAINAGE  2002   Right axilla  . ABDOMINAL HYSTERECTOMY    . APPENDECTOMY  2012  . AV FISTULA PLACEMENT Left 07/16/2012   Procedure: ARTERIOVENOUS (AV) FISTULA CREATION BRACHIO-CEPHALIC;  Surgeon: Angelia Mould, MD;  Location: [facility];  Service: Vascular;  Laterality: Left;  . CAPD INSERTION Left 04/08/2012   Procedure: LAPAROSCOPIC INSERTION CONTINUOUS AMBULATORY PERITONEAL DIALYSIS  (CAPD) CATHETER;  Surgeon: Ralene Ok, MD;  Location: [place];  Service: General;  Laterality: Left;  . CAPD REMOVAL N/A 08/20/2012   Procedure: CONTINUOUS AMBULATORY PERITONEAL DIALYSIS  (CAPD) CATHETER REMOVAL;  Surgeon: Zenovia Jarred, MD;  Location: [facility];  Service: General;  Laterality: N/A;  . CORONARY  ANGIOGRAM  02/23/2012   Procedure: CORONARY ANGIOGRAM;  Surgeon: Peter M Martinique, MD;  Location: [facility] CATH LAB;  Service: Cardiovascular;;  . INSERTION OF DIALYSIS CATHETER  January 2014   Right IJ   . INSERTION OF DIALYSIS CATHETER Left 08/19/2012   Procedure: INSERTION OF DIALYSIS CATHETER Internal Jugular;  Surgeon: Conrad [place]view, MD;  Location: [place];  Service: Vascular;  Laterality: Left;  . LAPAROSCOPIC REPOSITIONING CAPD CATHETER N/A 07/08/2012   Procedure:   DIAGNOSTIC LAPAROSCOPY LAPAROSCOPIC REPOSITIONING CAPD CATHETER;  Surgeon: Ralene Ok, MD;  Location: [place];  Service: General;  Laterality: N/A;  . LAPAROSCOPY N/A 07/14/2012   Procedure: LAPAROSCOPY DIAGNOSTIC WITH LYSIS OF ADHESIONS AND REPOSITIONING OF PERITONEAL DIALYSIS CATHETER;  Surgeon: Ralene Ok, MD;  Location: [place];  Service: General;  Laterality: N/A;  . PERCUTANEOUS CORONARY STENT INTERVENTION (PCI-S)  02/23/2012   Procedure: PERCUTANEOUS CORONARY STENT INTERVENTION (PCI-S);  Surgeon: Peter M Martinique, MD;  Location: [facility] CATH LAB;  Service: Cardiovascular;;  . UTERINE FIBROID SURGERY      OB History    No data available       Home Medications    Prior to Admission medications   Medication Sig Start Date End Date Taking? Authorizing Provider  aspirin EC 81 MG tablet Take 81 mg by mouth daily.   Yes Historical Provider, MD  calcitRIOL (ROCALTROL) 0.25 MCG capsule Take 0.25 mcg by mouth as directed. Every 2 days. 08/19/15  Yes Historical Provider, MD  Cholecalciferol (VITAMIN D) 2000 units tablet Take 2,000 Units by mouth daily. 08/20/15  Yes Historical Provider, MD  magnesium oxide (MAG-OX) 400 (241.3 Mg) MG tablet Take 400 mg by mouth 2 (two) times daily. 07/28/15  Yes Historical Provider, MD  metoprolol succinate (TOPROL-XL) 50 MG 24 hr tablet take 1 tablet by mouth once daily with food Patient taking differently: Take 1/2 tablet (25 mg) by mouth daily   Yes Lelon Perla, MD  metoprolol succinate (TOPROL-XL) 50 MG 24 hr tablet Take 50 mg by mouth daily.    Yes Historical Provider, MD  mycophenolate (MYFORTIC) 180 MG EC tablet Take 540 mg by mouth 2 (two) times daily. 08/09/15  Yes Historical Provider, MD  predniSONE (DELTASONE) 5 MG tablet Take 5 mg by mouth daily. 10/26/14  Yes Historical Provider, MD  senna-docusate (SENOKOT-S) 8.6-50 MG tablet Take 2 tablets by mouth 2 (two) times daily. 09/01/13  Yes Historical Provider, MD  sitaGLIPtin-metformin (JANUMET) 50-1000 MG  tablet Take 1 tablet by mouth 2 (two) times daily. 05/04/15 05/03/16 Yes Historical Provider, MD  tacrolimus (PROGRAF) 1 MG capsule Take by mouth 4 mg (4  pills) at 9 AM and 3 mg (3 pills) at 9 PM--Z94.0 03/24/16  Yes Historical Provider, MD  gabapentin (NEURONTIN) 300 MG capsule Take 1 capsule (300 mg total) by mouth at bedtime. Patient not taking: Reported on 09/13/2015 03/04/13   Harriet Masson, DPM  HYDROcodone-acetaminophen (NORCO/VICODIN) 5-325 MG tablet Take 1 tablet by mouth every 6 (six) hours as needed for severe pain. Patient not taking: Reported on 05/02/2016 09/13/15   Duffy Bruce, MD  oxyCODONE (OXY IR/ROXICODONE) 5 MG immediate release tablet Take 1 tablet (5 mg total) by mouth every 6 (six) hours as needed (pain). Patient not taking: Reported on 09/13/2015 08/21/12   Nishant Dhungel, MD  polyethylene glycol powder (GLYCOLAX/MIRALAX) powder Take 17 g by mouth daily as needed for mild constipation, moderate constipation or severe constipation.     Historical Provider, MD  pravastatin (PRAVACHOL) 40 MG tablet take  1 tablet by mouth every evening Patient not taking: Reported on 09/13/2015 03/03/13   Lelon Perla, MD  triamcinolone cream (KENALOG) 0.1 % Apply 1 application topically 2 (two) times daily. Patient not taking: Reported on 05/02/2016 09/18/15   Domenic Moras, PA-C  zolpidem (AMBIEN) 5 MG tablet take 1 tablet by mouth at bedtime if needed for sleep Patient not taking: Reported on 09/13/2015 03/03/13   Marletta Lor, MD    Family History Family History  Problem Relation Age of Onset  . Heart disease Mother     MI at age 80  . Diabetes Father   . Kidney failure Father   . Diabetes Sister     1 sister with diabtes  . Diabetes Brother     Social History Social History  Substance Use Topics  . Smoking status: Former Smoker    Quit date: 03/12/2004  . Smokeless tobacco: Never Used  . Alcohol use Yes     Comment: rarely     Allergies   Patient has no known  allergies.   Review of Systems Review of Systems  Constitutional: Negative for chills and fever.  HENT: Negative for congestion.   Eyes: Negative for visual disturbance.  Respiratory: Negative for cough and shortness of breath.   Cardiovascular: Negative for chest pain.  Gastrointestinal: Negative for abdominal pain, blood in stool, constipation, diarrhea, nausea and vomiting.  Genitourinary: Negative for dysuria.  Musculoskeletal: Negative for back pain.  Skin: Negative for rash.  Neurological: Negative for dizziness and weakness.     Physical Exam Updated Vital Signs BP (!) 173/86 (BP Location: Right Arm)   Pulse 80   Temp 97.9 F (36.6 C) (Oral)   Resp 18   Ht 5\' 2"  (1.575 m)   Wt 87.1 kg   SpO2 94%   BMI 35.12 kg/m   Physical Exam  Constitutional: She is oriented to person, place, and time. She appears well-developed and well-nourished. No distress.  HENT:  Head: Normocephalic and atraumatic.  Cardiovascular: Normal rate, regular rhythm and normal heart sounds.   No murmur heard. Pulmonary/Chest: Effort normal and breath sounds normal. No respiratory distress. She has no wheezes. She has no rales.  Abdominal: Soft. Bowel sounds are normal. She exhibits no distension. There is no tenderness.  Musculoskeletal: She exhibits no edema.  Neurological: She is alert and oriented to person, place, and time.  Skin: Skin is warm and dry.  Nursing note and vitals reviewed.    ED Treatments / Results  Labs (all labs ordered are listed, but only abnormal results are displayed) Labs Reviewed  BASIC METABOLIC PANEL - Abnormal; Notable for the following:       Result Value   Sodium 130 (*)    Chloride 99 (*)    Glucose, Bld 571 (*)    BUN 27 (*)    Creatinine, Ser 1.49 (*)    GFR calc non Af Amer 41 (*)    GFR calc Af Amer 48 (*)    All other components within normal limits  URINALYSIS, ROUTINE W REFLEX MICROSCOPIC - Abnormal; Notable for the following:    Color, Urine  COLORLESS (*)    Glucose, UA >=500 (*)    Squamous Epithelial / LPF 0-5 (*)    All other components within normal limits  CBG MONITORING, ED - Abnormal; Notable for the following:    Glucose-Capillary 586 (*)    All other components within normal limits  CBG MONITORING, ED - Abnormal; Notable for the  following:    Glucose-Capillary 429 (*)    All other components within normal limits  CBG MONITORING, ED - Abnormal; Notable for the following:    Glucose-Capillary 326 (*)    All other components within normal limits  CBC    EKG  EKG Interpretation None       Radiology No results found.  Procedures Procedures (including critical care time)  Medications Ordered in ED Medications  sodium chloride 0.9 % bolus 1,000 mL (0 mLs Intravenous Stopped 05/02/16 1852)  insulin aspart (novoLOG) injection 7 Units (7 Units Subcutaneous Given 05/02/16 1848)  sodium chloride 0.9 % bolus 1,000 mL (0 mLs Intravenous Stopped 05/02/16 2022)     Initial Impression / Assessment and Plan / ED Course  I have reviewed the triage vital signs and the nursing notes.  Pertinent labs & imaging results that were available during my care of the patient were reviewed by me and considered in my medical decision making (see chart for details).    Caytlin Better is a 46 y.o. female who presents to ED from PCP for hyperglycemia in the 700s at the clinic. Patient was therefore routine follow-up appointment and with no complaints. She had just eaten a large breakfast and drink even large cup of coffee with cream and sugar. She also did not take her diabetic medication this morning. Blood sugars typically run in the mid 100s. Labs reviewed with slight bump in BUN and creatinine. Glucose of 571 with normal CO2 and anion gap. No ketones in the urine. DKA unlikely. UA with no signs of infection and patient with no symptoms to suggest infectious etiology. Not on steroids recently. Fluid bolus given and blood sugar trending  downward. 7 units insulin given CPT now 326 and patient requesting discharge to home. PCP follow-up strongly encouraged. Discussed modifying diet andtaking meds as directed. Return precautions discussed and all questions answered.   Final Clinical Impressions(s) / ED Diagnoses   Final diagnoses:  Hyperglycemia    New Prescriptions Discharge Medication List as of 05/02/2016  8:31 PM       [facility] Chieko Neises, PA-C 05/02/16 2153    Dorie Rank, MD 05/03/16 2049

## 2016-05-02 NOTE — ED Notes (Signed)
Provider at bedside

## 2016-05-02 NOTE — Discharge Instructions (Signed)
Please follow your diabetic diet. Check blood sugar regularly. Take your medication as directed. Follow up with your primary care provider for discussion of today's visit. Return to ER for new or worsening symptoms, any additional concerns.

## 2016-05-24 DIAGNOSIS — Z5181 Encounter for therapeutic drug level monitoring: Secondary | ICD-10-CM | POA: Diagnosis not present

## 2016-05-24 DIAGNOSIS — I1311 Hypertensive heart and chronic kidney disease without heart failure, with stage 5 chronic kidney disease, or end stage renal disease: Secondary | ICD-10-CM | POA: Diagnosis not present

## 2016-05-24 DIAGNOSIS — Z7982 Long term (current) use of aspirin: Secondary | ICD-10-CM | POA: Diagnosis not present

## 2016-05-24 DIAGNOSIS — Z940 Kidney transplant status: Secondary | ICD-10-CM | POA: Diagnosis not present

## 2016-05-24 DIAGNOSIS — Z4822 Encounter for aftercare following kidney transplant: Secondary | ICD-10-CM | POA: Diagnosis not present

## 2016-05-24 DIAGNOSIS — E785 Hyperlipidemia, unspecified: Secondary | ICD-10-CM | POA: Diagnosis not present

## 2016-05-24 DIAGNOSIS — Z79899 Other long term (current) drug therapy: Secondary | ICD-10-CM | POA: Diagnosis not present

## 2016-05-24 DIAGNOSIS — I2510 Atherosclerotic heart disease of native coronary artery without angina pectoris: Secondary | ICD-10-CM | POA: Diagnosis not present

## 2016-05-24 DIAGNOSIS — Z794 Long term (current) use of insulin: Secondary | ICD-10-CM | POA: Diagnosis not present

## 2016-05-24 DIAGNOSIS — E1122 Type 2 diabetes mellitus with diabetic chronic kidney disease: Secondary | ICD-10-CM | POA: Diagnosis not present

## 2016-05-24 DIAGNOSIS — D899 Disorder involving the immune mechanism, unspecified: Secondary | ICD-10-CM | POA: Diagnosis not present

## 2016-05-24 DIAGNOSIS — N186 End stage renal disease: Secondary | ICD-10-CM | POA: Diagnosis not present

## 2016-05-24 DIAGNOSIS — I10 Essential (primary) hypertension: Secondary | ICD-10-CM | POA: Diagnosis not present

## 2016-05-24 DIAGNOSIS — E1142 Type 2 diabetes mellitus with diabetic polyneuropathy: Secondary | ICD-10-CM | POA: Diagnosis not present

## 2016-05-24 DIAGNOSIS — E1165 Type 2 diabetes mellitus with hyperglycemia: Secondary | ICD-10-CM | POA: Diagnosis not present

## 2016-05-25 DIAGNOSIS — Z8669 Personal history of other diseases of the nervous system and sense organs: Secondary | ICD-10-CM | POA: Diagnosis not present

## 2016-05-25 DIAGNOSIS — N2581 Secondary hyperparathyroidism of renal origin: Secondary | ICD-10-CM | POA: Diagnosis not present

## 2016-05-25 DIAGNOSIS — R200 Anesthesia of skin: Secondary | ICD-10-CM | POA: Diagnosis not present

## 2016-05-25 DIAGNOSIS — Z298 Encounter for other specified prophylactic measures: Secondary | ICD-10-CM | POA: Diagnosis not present

## 2016-05-25 DIAGNOSIS — R202 Paresthesia of skin: Secondary | ICD-10-CM | POA: Diagnosis not present

## 2016-05-25 DIAGNOSIS — Z940 Kidney transplant status: Secondary | ICD-10-CM | POA: Diagnosis not present

## 2016-05-25 DIAGNOSIS — E669 Obesity, unspecified: Secondary | ICD-10-CM | POA: Diagnosis not present

## 2016-05-25 DIAGNOSIS — Z48298 Encounter for aftercare following other organ transplant: Secondary | ICD-10-CM | POA: Diagnosis not present

## 2016-05-25 DIAGNOSIS — R791 Abnormal coagulation profile: Secondary | ICD-10-CM | POA: Diagnosis not present

## 2016-05-31 DIAGNOSIS — N269 Renal sclerosis, unspecified: Secondary | ICD-10-CM | POA: Diagnosis not present

## 2016-05-31 DIAGNOSIS — Z940 Kidney transplant status: Secondary | ICD-10-CM | POA: Diagnosis not present

## 2016-05-31 DIAGNOSIS — N179 Acute kidney failure, unspecified: Secondary | ICD-10-CM | POA: Diagnosis not present

## 2016-05-31 DIAGNOSIS — E119 Type 2 diabetes mellitus without complications: Secondary | ICD-10-CM | POA: Diagnosis not present

## 2016-05-31 DIAGNOSIS — T8619 Other complication of kidney transplant: Secondary | ICD-10-CM | POA: Diagnosis not present

## 2016-05-31 DIAGNOSIS — I10 Essential (primary) hypertension: Secondary | ICD-10-CM | POA: Diagnosis not present

## 2016-06-01 DIAGNOSIS — T8619 Other complication of kidney transplant: Secondary | ICD-10-CM | POA: Diagnosis not present

## 2016-06-01 DIAGNOSIS — E119 Type 2 diabetes mellitus without complications: Secondary | ICD-10-CM | POA: Diagnosis not present

## 2016-06-01 DIAGNOSIS — Z940 Kidney transplant status: Secondary | ICD-10-CM | POA: Diagnosis not present

## 2016-06-01 DIAGNOSIS — I10 Essential (primary) hypertension: Secondary | ICD-10-CM | POA: Diagnosis not present

## 2016-06-08 DIAGNOSIS — Z940 Kidney transplant status: Secondary | ICD-10-CM | POA: Diagnosis not present

## 2016-07-14 DIAGNOSIS — Z5181 Encounter for therapeutic drug level monitoring: Secondary | ICD-10-CM | POA: Diagnosis not present

## 2016-07-14 DIAGNOSIS — Z940 Kidney transplant status: Secondary | ICD-10-CM | POA: Diagnosis not present

## 2016-08-11 DIAGNOSIS — E118 Type 2 diabetes mellitus with unspecified complications: Secondary | ICD-10-CM | POA: Diagnosis not present

## 2016-08-11 DIAGNOSIS — Z8669 Personal history of other diseases of the nervous system and sense organs: Secondary | ICD-10-CM | POA: Diagnosis not present

## 2016-08-11 DIAGNOSIS — Z940 Kidney transplant status: Secondary | ICD-10-CM | POA: Diagnosis not present

## 2016-08-11 DIAGNOSIS — I2510 Atherosclerotic heart disease of native coronary artery without angina pectoris: Secondary | ICD-10-CM | POA: Diagnosis not present

## 2016-08-11 DIAGNOSIS — I129 Hypertensive chronic kidney disease with stage 1 through stage 4 chronic kidney disease, or unspecified chronic kidney disease: Secondary | ICD-10-CM | POA: Diagnosis not present

## 2016-08-11 DIAGNOSIS — D631 Anemia in chronic kidney disease: Secondary | ICD-10-CM | POA: Diagnosis not present

## 2016-08-11 DIAGNOSIS — N2581 Secondary hyperparathyroidism of renal origin: Secondary | ICD-10-CM | POA: Diagnosis not present

## 2016-08-11 DIAGNOSIS — E0842 Diabetes mellitus due to underlying condition with diabetic polyneuropathy: Secondary | ICD-10-CM | POA: Diagnosis not present

## 2016-08-11 DIAGNOSIS — E782 Mixed hyperlipidemia: Secondary | ICD-10-CM | POA: Diagnosis not present

## 2016-08-11 DIAGNOSIS — R790 Abnormal level of blood mineral: Secondary | ICD-10-CM | POA: Diagnosis not present

## 2016-08-11 DIAGNOSIS — N182 Chronic kidney disease, stage 2 (mild): Secondary | ICD-10-CM | POA: Diagnosis not present

## 2017-05-20 ENCOUNTER — Encounter (HOSPITAL_COMMUNITY): Payer: Self-pay | Admitting: Emergency Medicine

## 2017-05-20 ENCOUNTER — Emergency Department (HOSPITAL_COMMUNITY): Payer: Self-pay

## 2017-05-20 ENCOUNTER — Emergency Department (HOSPITAL_COMMUNITY)
Admission: EM | Admit: 2017-05-20 | Discharge: 2017-05-20 | Disposition: A | Discharge status: Home / Self Care | Payer: Self-pay | Attending: Emergency Medicine | Admitting: Emergency Medicine

## 2017-05-20 DIAGNOSIS — Z7984 Long term (current) use of oral hypoglycemic drugs: Secondary | ICD-10-CM | POA: Insufficient documentation

## 2017-05-20 DIAGNOSIS — E1122 Type 2 diabetes mellitus with diabetic chronic kidney disease: Secondary | ICD-10-CM | POA: Insufficient documentation

## 2017-05-20 DIAGNOSIS — Z87891 Personal history of nicotine dependence: Secondary | ICD-10-CM | POA: Insufficient documentation

## 2017-05-20 DIAGNOSIS — Z7982 Long term (current) use of aspirin: Secondary | ICD-10-CM | POA: Insufficient documentation

## 2017-05-20 DIAGNOSIS — E1140 Type 2 diabetes mellitus with diabetic neuropathy, unspecified: Secondary | ICD-10-CM | POA: Insufficient documentation

## 2017-05-20 DIAGNOSIS — I120 Hypertensive chronic kidney disease with stage 5 chronic kidney disease or end stage renal disease: Secondary | ICD-10-CM | POA: Insufficient documentation

## 2017-05-20 DIAGNOSIS — Z79899 Other long term (current) drug therapy: Secondary | ICD-10-CM | POA: Insufficient documentation

## 2017-05-20 DIAGNOSIS — I2510 Atherosclerotic heart disease of native coronary artery without angina pectoris: Secondary | ICD-10-CM | POA: Insufficient documentation

## 2017-05-20 DIAGNOSIS — R200 Anesthesia of skin: Secondary | ICD-10-CM | POA: Insufficient documentation

## 2017-05-20 DIAGNOSIS — N186 End stage renal disease: Secondary | ICD-10-CM | POA: Insufficient documentation

## 2017-05-20 DIAGNOSIS — Z992 Dependence on renal dialysis: Secondary | ICD-10-CM | POA: Insufficient documentation

## 2017-05-20 DIAGNOSIS — R202 Paresthesia of skin: Secondary | ICD-10-CM | POA: Insufficient documentation

## 2017-05-20 LAB — CBC
HCT: 35.4 % — ABNORMAL LOW (ref 36.0–46.0)
Hemoglobin: 11.1 g/dL — ABNORMAL LOW (ref 12.0–15.0)
MCH: 27.3 pg (ref 26.0–34.0)
MCHC: 31.4 g/dL (ref 30.0–36.0)
MCV: 87.2 fL (ref 78.0–100.0)
Platelets: 187 10*3/uL (ref 150–400)
RBC: 4.06 MIL/uL (ref 3.87–5.11)
RDW: 14.2 % (ref 11.5–15.5)
WBC: 4.2 10*3/uL (ref 4.0–10.5)

## 2017-05-20 LAB — COMPREHENSIVE METABOLIC PANEL
ALT: 13 U/L — ABNORMAL LOW (ref 14–54)
AST: 17 U/L (ref 15–41)
Albumin: 3.5 g/dL (ref 3.5–5.0)
Alkaline Phosphatase: 70 U/L (ref 38–126)
Anion gap: 10 (ref 5–15)
BUN: 30 mg/dL — ABNORMAL HIGH (ref 6–20)
CO2: 23 mmol/L (ref 22–32)
Calcium: 9.2 mg/dL (ref 8.9–10.3)
Chloride: 103 mmol/L (ref 101–111)
Creatinine, Ser: 1.8 mg/dL — ABNORMAL HIGH (ref 0.44–1.00)
GFR calc Af Amer: 38 mL/min — ABNORMAL LOW (ref >60)
GFR calc non Af Amer: 32 mL/min — ABNORMAL LOW (ref >60)
Glucose, Bld: 264 mg/dL — ABNORMAL HIGH (ref 65–99)
Potassium: 4.6 mmol/L (ref 3.5–5.1)
Sodium: 136 mmol/L (ref 135–145)
Total Bilirubin: 0.5 mg/dL (ref 0.3–1.2)
Total Protein: 6.4 g/dL — ABNORMAL LOW (ref 6.5–8.1)

## 2017-05-20 LAB — I-STAT CHEM 8, ED
BUN: 30 mg/dL — ABNORMAL HIGH (ref 6–20)
Calcium, Ion: 1.33 mmol/L (ref 1.15–1.40)
Chloride: 106 mmol/L (ref 101–111)
Creatinine, Ser: 1.7 mg/dL — ABNORMAL HIGH (ref 0.44–1.00)
Glucose, Bld: 257 mg/dL — ABNORMAL HIGH (ref 65–99)
HCT: 37 % (ref 36.0–46.0)
Hemoglobin: 12.6 g/dL (ref 12.0–15.0)
Potassium: 4.5 mmol/L (ref 3.5–5.1)
Sodium: 137 mmol/L (ref 135–145)
TCO2: 23 mmol/L (ref 22–32)

## 2017-05-20 LAB — DIFFERENTIAL
Basophils Absolute: 0 10*3/uL (ref 0.0–0.1)
Basophils Relative: 0 %
Eosinophils Absolute: 0.1 10*3/uL (ref 0.0–0.7)
Eosinophils Relative: 2 %
Lymphocytes Relative: 28 %
Lymphs Abs: 1.2 10*3/uL (ref 0.7–4.0)
Monocytes Absolute: 0.3 10*3/uL (ref 0.1–1.0)
Monocytes Relative: 6 %
Neutro Abs: 2.7 10*3/uL (ref 1.7–7.7)
Neutrophils Relative %: 64 %

## 2017-05-20 LAB — PROTIME-INR
INR: 0.94
Prothrombin Time: 12.5 seconds (ref 11.4–15.2)

## 2017-05-20 LAB — I-STAT BETA HCG BLOOD, ED (MC, WL, AP ONLY): I-stat hCG, quantitative: 10.5 m[IU]/mL — ABNORMAL HIGH (ref <5)

## 2017-05-20 LAB — APTT: aPTT: 26 seconds (ref 24–36)

## 2017-05-20 LAB — I-STAT TROPONIN, ED: Troponin i, poc: 0 ng/mL (ref 0.00–0.08)

## 2017-05-20 MED ORDER — ASPIRIN 81 MG PO CHEW: 324.0000 mg | CHEWABLE_TABLET | Freq: Once | ORAL | Status: DC

## 2017-05-20 MED ORDER — ATORVASTATIN CALCIUM 40 MG PO TABS: 40.0000 mg | ORAL_TABLET | Freq: Every day | ORAL | 0 refills | Status: AC

## 2017-05-20 MED ORDER — SODIUM CHLORIDE 0.9 % IV SOLN: Freq: Once | INTRAVENOUS | Status: DC

## 2017-05-20 NOTE — ED Triage Notes (Signed)
PT states new onset 15 minutes ago of right sided facial and right arm numbness and tingling. No weakness. No speech deficits. No visual disturbances. Denies headache.

## 2017-05-20 NOTE — Consult Note (Signed)
Requesting Physician: Dr. Ellender Hose    Chief Complaint: R face, arm and leg numbness  History obtained from: Patient and Chart     HPI:                                                                                                                                       Lori Greer is an 47 y.o. female past medical history of hypertension, diabetes mellitus, obesity, and stage renal disease presented with sudden onset right face, arm and leg numbness. She was stroke alerted in the ER.  The patient noticed around 10 AM this morning while in the car she has sudden onset numbness of the right side of her body. She felt that it was funny. She denies any weakness. No difficulty with speech, double vision or walking. She was stroke alerted at triage. Her blood pressure was 161 systolics. CT head was negative for acute infarct   Date last known well: 4.28.19 Time last known well: Around 10 am  tPA Given: no, mild symptoms  NIHSS: 1 Baseline MRS 0    Past Medical History:  Diagnosis Date  . Anemia   . Bell's palsy 05/12/2008  . CAD (coronary artery disease) 01/2012   a. Cardiac cath done for abn nuc 02/23/2012 - 2 vessel CAD s/p DES to mid-LAD, residual disease in the PL OM for medical therapy.   Lori Greer DIABETES MELLITUS, TYPE II 03/16/2008  . End-stage renal disease on peritoneal dialysis 03/12/2012   PD cath placed in Mar 2014 and had tunneled HD cath placed in Jan 2014. Having problems as of Jun '14 with PD cath draining poorly .  PD cath repositioned from LUQ to pelvis by Dr Rosendo Gros on 07/07/12 by lap procedure.     Lori Greer ESRD (end stage renal disease) ([place])    ESRD on HD  . Fibroid, uterine   . HYPERTENSION 01/06/2010  . OBESITY 03/16/2008  . Proliferative diabetic retinopathy(362.02) 04/23/2009  . Shortness of breath     Past Surgical History:  Procedure Laterality Date  . ABCESS DRAINAGE  2002   Right axilla  . ABDOMINAL HYSTERECTOMY    . APPENDECTOMY  2012  . AV FISTULA PLACEMENT Left  07/16/2012   Procedure: ARTERIOVENOUS (AV) FISTULA CREATION BRACHIO-CEPHALIC;  Surgeon: Angelia Mould, MD;  Location: [place];  Service: Vascular;  Laterality: Left;  . CAPD INSERTION Left 04/08/2012   Procedure: LAPAROSCOPIC INSERTION CONTINUOUS AMBULATORY PERITONEAL DIALYSIS  (CAPD) CATHETER;  Surgeon: Ralene Ok, MD;  Location: [place];  Service: General;  Laterality: Left;  . CAPD REMOVAL N/A 08/20/2012   Procedure: CONTINUOUS AMBULATORY PERITONEAL DIALYSIS  (CAPD) CATHETER REMOVAL;  Surgeon: Zenovia Jarred, MD;  Location: [place];  Service: General;  Laterality: N/A;  . CORONARY ANGIOGRAM  02/23/2012   Procedure: CORONARY ANGIOGRAM;  Surgeon: Peter M Martinique, MD;  Location: [facility] CATH LAB;  Service: Cardiovascular;;  . INSERTION OF DIALYSIS  CATHETER  January 2014   Right IJ   . INSERTION OF DIALYSIS CATHETER Left 08/19/2012   Procedure: INSERTION OF DIALYSIS CATHETER Internal Jugular;  Surgeon: Conrad Gurabo, MD;  Location: [place];  Service: Vascular;  Laterality: Left;  . LAPAROSCOPIC REPOSITIONING CAPD CATHETER N/A 07/08/2012   Procedure:  DIAGNOSTIC LAPAROSCOPY LAPAROSCOPIC REPOSITIONING CAPD CATHETER;  Surgeon: Ralene Ok, MD;  Location: [place];  Service: General;  Laterality: N/A;  . LAPAROSCOPY N/A 07/14/2012   Procedure: LAPAROSCOPY DIAGNOSTIC WITH LYSIS OF ADHESIONS AND REPOSITIONING OF PERITONEAL DIALYSIS CATHETER;  Surgeon: Ralene Ok, MD;  Location: [place];  Service: General;  Laterality: N/A;  . PERCUTANEOUS CORONARY STENT INTERVENTION (PCI-S)  02/23/2012   Procedure: PERCUTANEOUS CORONARY STENT INTERVENTION (PCI-S);  Surgeon: Peter M Martinique, MD;  Location: [facility] CATH LAB;  Service: Cardiovascular;;  . UTERINE FIBROID SURGERY      Family History  Problem Relation Age of Onset  . Heart disease Mother        MI at age 78  . Diabetes Father   . Kidney failure Father   . Diabetes Sister        1 sister with diabtes  . Diabetes Brother    Social History:  reports that she  quit smoking about 13 years ago. She has never used smokeless tobacco. She reports that she drinks alcohol. She reports that she does not use drugs.  Allergies: No Known Allergies  Medications:                                                                                                                        I reviewed home medications   ROS:                                                                                                                                     14 systems reviewed and negative except above    Examination:  General: Appears well-developed and well-nourished.  Psych: Affect appropriate to situation Eyes: No scleral injection HENT: No OP obstrucion Head: Normocephalic.  Cardiovascular: Normal rate and regular rhythm.  Respiratory: Effort normal and breath sounds normal to anterior ascultation GI: Soft.  No distension. There is no tenderness.  Skin: WDI   Neurological Examination Mental Status: Alert, oriented, thought content appropriate.  Speech fluent without evidence of aphasia. Able to follow 3 step commands without difficulty. Cranial Nerves: II: Visual fields grossly normal,  III,IV, VI: ptosis not present, extra-ocular motions intact bilaterally, pupils equal, round, reactive to light and accommodation V,VII: smile symmetric, facial light touch sensation normal bilaterally VIII: hearing normal bilaterally IX,X: uvula rises symmetrically XI: bilateral shoulder shrug XII: midline tongue extension Motor: Right : Upper extremity   5/5    Left:     Upper extremity   5/5  Lower extremity   5/5     Lower extremity   5/5 Tone and bulk:normal tone throughout; no atrophy noted Sensory: Mild reduced sensation over right face, arm and leg Deep Tendon Reflexes: 2+ and symmetric throughout Plantars: Right: downgoing   Left: downgoing Cerebellar: normal  finger-to-nose, normal rapid alternating movements and normal heel-to-shin test Gait: normal gait and station     Lab Results: Basic Metabolic Panel: Recent Labs  Lab 05/20/17 1053 05/20/17 1111  NA 136 137  K 4.6 4.5  CL 103 106  CO2 23  --   GLUCOSE 264* 257*  BUN 30* 30*  CREATININE 1.80* 1.70*  CALCIUM 9.2  --     CBC: Recent Labs  Lab 05/20/17 1053 05/20/17 1111  WBC 4.2  --   NEUTROABS 2.7  --   HGB 11.1* 12.6  HCT 35.4* 37.0  MCV 87.2  --   PLT 187  --     Coagulation Studies: Recent Labs    05/20/17 1053  LABPROT 12.5  INR 0.94    Imaging: Mr Brain Wo Contrast  Result Date: 05/20/2017 CLINICAL DATA:  Right-sided facial and arm numbness with tingling. No weakness. EXAM: MRI HEAD WITHOUT CONTRAST TECHNIQUE: Multiplanar, multiecho pulse sequences of the brain and surrounding structures were obtained without intravenous contrast. COMPARISON:  Head CT from earlier today FINDINGS: Brain: No acute infarction, hemorrhage, hydrocephalus, extra-axial collection or mass lesion. Mild cerebral white matter disease with small patchy FLAIR hyperintensities bilaterally. Although demyelinating disease is considered given demographics and a degree of periventricular predilection, this is most likely chronic small vessel ischemia given patient's vascular risk factors. No infratentorial or juxta cortical signal abnormality. Vascular: Major flow voids are preserved Skull and upper cervical spine: No evidence of marrow lesion Sinuses/Orbits: Bilateral cataract resection.  Clear sinuses. IMPRESSION: 1. No acute finding including infarct. 2. Mild cerebral white matter disease, vascular risk factors favoring chronic small vessel ischemia. Electronically Signed   By: Monte Fantasia M.D.   On: 05/20/2017 14:58   Ct Head Code Stroke Wo Contrast`  Result Date: 05/20/2017 CLINICAL DATA:  Code stroke. RIGHT-sided face and arm numbness and weakness. Diabetic patient with end-stage renal  disease. EXAM: CT HEAD WITHOUT CONTRAST TECHNIQUE: Contiguous axial images were obtained from the base of the skull through the vertex without intravenous contrast. COMPARISON:  None. FINDINGS: Brain: No evidence for acute infarction, hemorrhage, mass lesion, hydrocephalus, or extra-axial fluid. No significant atrophy. Mild hypoattenuation of white matter, likely chronic microvascular ischemic change. Partial empty sella. Vascular: Calcification of the cavernous internal carotid arteries consistent with cerebrovascular atherosclerotic disease. No signs of intracranial large vessel occlusion.  Skull: Calvarium intact. Sinuses/Orbits: No significant sinus disease. Dense RIGHT lenticular opacity. LEFT cataract extraction. Other: None. ASPECTS [facility] Stroke Program Early CT Score) - Ganglionic level infarction (caudate, lentiform nuclei, internal capsule, insula, M1-M3 cortex): 7 - Supraganglionic infarction (M4-M6 cortex): 3 Total score (0-10 with 10 being normal): 10 IMPRESSION: 1. Negative exam 2. ASPECTS is 10. 3. These results were communicated to Dr. Lorraine Lax at 11:25 [place] 4/28/2019by text page via the [facility] messaging system. Electronically Signed   By: Staci Righter M.D.   On: 05/20/2017 11:38     ASSESSMENT AND PLAN  47 y.o. Female with multiple vascular risk factors presents sudden onset right-sided face arm and leg numbness that lasted for about  1 hour. MRI brain was obtained which was negative for acute stroke.  Impression:  Possible TIA   Plan Patient has a shunt her baseline. She has a low ABCD 2 score and therefore low probability of a recurrent event in the next 90 days. Etiology for TIA is likely small vessel disease due to her multiple vascular risk factors. Due to her impaired renal function we did not obtain a CT angiogram.   F/U with neurology as outpatient Echo and Carotid US as outpatient  ASA 81 mg daily Atorvastatin 40mg  daily   Sushanth Aroor [facility] Pager  Number [phone] Stroke alert- R side numbness

## 2017-05-20 NOTE — ED Provider Notes (Signed)
[facility] EMERGENCY DEPARTMENT Provider Note   CSN: [id number] Arrival date & time: 05/20/17  1049   An emergency department physician performed an initial assessment on this suspected stroke patient at 1059.  History   Chief Complaint Chief Complaint  Patient presents with  . Code Stroke    HPI Lori Greer is a 47 y.o. female.  HPI   47 year old female with past medical history as below here with transient numbness and tingling of her right side.  Patient was reportedly driving today when she experienced acute onset of a numbness and tingling sensation.  She states it was throughout her entire right side including her face, arms, and legs.  She has a history of neuropathy with similar tingling, but has never had it on one side before.  She strongly denies any associated weakness.  She is able to drive herself to the hospital.  Symptoms have now resolved over the preceding 20 to 30 minutes.  She denies any associated headache.  No vision changes.  No gait abnormalities.  She has no history of stroke that she is aware of.  She does have history of diabetes.  She also has a history of Bell's palsy but states this is resolved.  She did not feel like she had any difficulty swallowing or speaking during the episode.  Past Medical History:  Diagnosis Date  . Anemia   . Bell's palsy 05/12/2008  . CAD (coronary artery disease) 01/2012   a. Cardiac cath done for abn nuc 02/23/2012 - 2 vessel CAD s/p DES to mid-LAD, residual disease in the PL OM for medical therapy.   [name] DIABETES MELLITUS, TYPE II 03/16/2008  . End-stage renal disease on peritoneal dialysis 03/12/2012   PD cath placed in Mar 2014 and had tunneled HD cath placed in Jan 2014. Having problems as of Jun '14 with PD cath draining poorly .  PD cath repositioned from LUQ to pelvis by Dr Rosendo Gros on 07/07/12 by lap procedure.     [name] ESRD (end stage renal disease) ([place])    ESRD on HD  . Fibroid, uterine   . HYPERTENSION  01/06/2010  . OBESITY 03/16/2008  . Proliferative diabetic retinopathy(362.02) 04/23/2009  . Shortness of breath     Patient Active Problem List   Diagnosis Date Noted  . Peritonitis in infectious disease ([place]) 08/21/2012  . DM type 2, uncontrolled, with renal complications ([place]) 89/38/1017  . Lactic acidosis 08/16/2012  . Abdominal pain 08/16/2012  . Hypokalemia 08/16/2012  . Nausea and vomiting 08/16/2012  . Dehydration 08/16/2012  . End stage renal disease ([place]) 08/14/2012  . Anemia 07/16/2012  . Secondary hyperparathyroidism (of renal origin) 07/16/2012  . End-stage renal disease on peritoneal dialysis ([place]) 03/12/2012  . CAD (coronary artery disease) 02/24/2012  . Hypertension 01/06/2010  . Proliferative diabetic retinopathy ([place]) 04/23/2009  . Diabetes mellitus ([place]) 03/16/2008  . Obesity 03/16/2008    Past Surgical History:  Procedure Laterality Date  . ABCESS DRAINAGE  2002   Right axilla  . ABDOMINAL HYSTERECTOMY    . APPENDECTOMY  2012  . AV FISTULA PLACEMENT Left 07/16/2012   Procedure: ARTERIOVENOUS (AV) FISTULA CREATION BRACHIO-CEPHALIC;  Surgeon: Angelia Mould, MD;  Location: [facility];  Service: Vascular;  Laterality: Left;  . CAPD INSERTION Left 04/08/2012   Procedure: LAPAROSCOPIC INSERTION CONTINUOUS AMBULATORY PERITONEAL DIALYSIS  (CAPD) CATHETER;  Surgeon: Ralene Ok, MD;  Location: [facility];  Service: General;  Laterality: Left;  . CAPD REMOVAL N/A 08/20/2012  Procedure: CONTINUOUS AMBULATORY PERITONEAL DIALYSIS  (CAPD) CATHETER REMOVAL;  Surgeon: Zenovia Jarred, MD;  Location: [facility];  Service: General;  Laterality: N/A;  . CORONARY ANGIOGRAM  02/23/2012   Procedure: CORONARY ANGIOGRAM;  Surgeon: Peter M Martinique, MD;  Location: [facility] CATH LAB;  Service: Cardiovascular;;  . INSERTION OF DIALYSIS CATHETER  January 2014   Right IJ   . INSERTION OF DIALYSIS CATHETER Left 08/19/2012   Procedure: INSERTION OF DIALYSIS CATHETER Internal Jugular;  Surgeon: Conrad Horseheads North, MD;  Location: [facility];  Service: Vascular;  Laterality: Left;  . LAPAROSCOPIC REPOSITIONING CAPD CATHETER N/A 07/08/2012   Procedure:  DIAGNOSTIC LAPAROSCOPY LAPAROSCOPIC REPOSITIONING CAPD CATHETER;  Surgeon: Ralene Ok, MD;  Location: [facility];  Service: General;  Laterality: N/A;  . LAPAROSCOPY N/A 07/14/2012   Procedure: LAPAROSCOPY DIAGNOSTIC WITH LYSIS OF ADHESIONS AND REPOSITIONING OF PERITONEAL DIALYSIS CATHETER;  Surgeon: Ralene Ok, MD;  Location: [facility];  Service: General;  Laterality: N/A;  . PERCUTANEOUS CORONARY STENT INTERVENTION (PCI-S)  02/23/2012   Procedure: PERCUTANEOUS CORONARY STENT INTERVENTION (PCI-S);  Surgeon: Peter M Martinique, MD;  Location: [facility] CATH LAB;  Service: Cardiovascular;;  . UTERINE FIBROID SURGERY       OB History   None      Home Medications    Prior to Admission medications   Medication Sig Start Date End Date Taking? Authorizing Provider  aspirin EC 81 MG tablet Take 81 mg by mouth daily.    [provider]  atorvastatin (LIPITOR) 40 MG tablet Take 1 tablet (40 mg total) by mouth daily. 05/20/17 06/19/17  Duffy Bruce, MD  calcitRIOL (ROCALTROL) 0.25 MCG capsule Take 0.25 mcg by mouth as directed. Every 2 days. 08/19/15   [provider]  Cholecalciferol (VITAMIN D) 2000 units tablet Take 2,000 Units by mouth daily. 08/20/15   [provider]  gabapentin (NEURONTIN) 300 MG capsule Take 1 capsule (300 mg total) by mouth at bedtime. 03/04/13   Harriet Masson, DPM  HYDROcodone-acetaminophen (NORCO/VICODIN) 5-325 MG tablet Take 1 tablet by mouth every 6 (six) hours as needed for severe pain. 09/13/15   Duffy Bruce, MD  magnesium oxide (MAG-OX) 400 (241.3 Mg) MG tablet Take 400 mg by mouth 2 (two) times daily. 07/28/15   [provider]  metoprolol succinate (TOPROL-XL) 50 MG 24 hr tablet take 1 tablet by mouth once daily with food Patient taking differently: Take 12.5 mg by mouth once a day with food     Crenshaw, Denice Bors, MD  metoprolol succinate (TOPROL-XL) 50 MG 24 hr tablet Take 50 mg by mouth daily.     [provider]  mycophenolate (MYFORTIC) 180 MG EC tablet Take 540 mg by mouth 2 (two) times daily. 08/09/15   [provider]  oxyCODONE (OXY IR/ROXICODONE) 5 MG immediate release tablet Take 1 tablet (5 mg total) by mouth every 6 (six) hours as needed (pain). 08/21/12   Dhungel, Nishant, MD  polyethylene glycol powder (GLYCOLAX/MIRALAX) powder Take 17 g by mouth daily as needed for mild constipation, moderate constipation or severe constipation.     [provider]  predniSONE (DELTASONE) 5 MG tablet Take 5 mg by mouth daily. 10/26/14   [provider]  senna-docusate (SENOKOT-S) 8.6-50 MG tablet Take 2 tablets by mouth 2 (two) times daily. 09/01/13   [provider]  sitaGLIPtin-metformin (JANUMET) 50-1000 MG tablet Take 1 tablet by mouth 2 (two) times daily. 05/04/15 05/20/17  [provider]  tacrolimus (PROGRAF) 1 MG capsule Take by  mouth 4 mg (4  pills) at 9 AM and 3 mg (3 pills) at 9 PM--Z94.0 03/24/16   [provider]  triamcinolone cream (KENALOG) 0.1 % Apply 1 application topically 2 (two) times daily. 09/18/15   Domenic Moras, PA-C  zolpidem Lorrin Mais) 5 MG tablet take 1 tablet by mouth at bedtime if needed for sleep 03/03/13   Marletta Lor, MD    Family History Family History  Problem Relation Age of Onset  . Heart disease Mother        MI at age 78  . Diabetes Father   . Kidney failure Father   . Diabetes Sister        1 sister with diabtes  . Diabetes Brother     Social History Social History   Tobacco Use  . Smoking status: Former Smoker    Last attempt to quit: 03/12/2004    Years since quitting: 13.1  . Smokeless tobacco: Never Used  Substance Use Topics  . Alcohol use: Yes    Comment: rarely  . Drug use: No     Allergies   Patient has no known allergies.   Review of Systems Review of Systems    Constitutional: Negative for chills, fatigue and fever.  HENT: Negative for congestion and rhinorrhea.   Eyes: Negative for visual disturbance.  Respiratory: Negative for cough, shortness of breath and wheezing.   Cardiovascular: Negative for chest pain and leg swelling.  Gastrointestinal: Negative for abdominal pain, diarrhea, nausea and vomiting.  Genitourinary: Negative for dysuria and flank pain.  Musculoskeletal: Negative for neck pain and neck stiffness.  Skin: Negative for rash and wound.  Allergic/Immunologic: Negative for immunocompromised state.  Neurological: Positive for numbness. Negative for syncope, weakness and headaches.  All other systems reviewed and are negative.    Physical Exam Updated Vital Signs BP 127/69 (BP Location: Right Arm)   Pulse 63   Temp 97.7 F (36.5 C) (Oral)   Resp 16   SpO2 100%   Physical Exam  Constitutional: She is oriented to person, place, and time. She appears well-developed and well-nourished. No distress.  HENT:  Head: Normocephalic and atraumatic.  Eyes: Conjunctivae are normal.  Neck: Neck supple.  Cardiovascular: Normal rate, regular rhythm and normal heart sounds. Exam reveals no friction rub.  No murmur heard. Pulmonary/Chest: Effort normal and breath sounds normal. No respiratory distress. She has no wheezes. She has no rales.  Abdominal: She exhibits no distension.  Musculoskeletal: She exhibits no edema.  Neurological: She is alert and oriented to person, place, and time. She exhibits normal muscle tone.  Skin: Skin is warm. Capillary refill takes less than 2 seconds.  Psychiatric: She has a normal mood and affect.  Nursing note and vitals reviewed.   Neurological Exam:  Mental Status: Alert and oriented to person, place, and time. Attention and concentration normal. Speech clear. Recent memory is intact. Cranial Nerves: Visual fields grossly intact. EOMI and PERRLA. No nystagmus noted. Facial sensation intact at  forehead, maxillary cheek, and chin/mandible bilaterally. No facial asymmetry or weakness. Hearing grossly normal. Uvula is midline, and palate elevates symmetrically. Normal SCM and trapezius strength. Tongue midline without fasciculations. Motor: Muscle strength 5/5 in proximal and distal UE and LE bilaterally. No pronator drift. Muscle tone normal. Reflexes: 2+ and symmetrical in all four extremities.  Sensation: Intact to light touch in upper and lower extremities distally bilaterally.  Gait: Normal without ataxia. Coordination: Normal FTN bilaterally.    ED Treatments / Results  Labs (  all labs ordered are listed, but only abnormal results are displayed) Labs Reviewed  CBC - Abnormal; Notable for the following components:      Result Value   Hemoglobin 11.1 (*)    HCT 35.4 (*)    All other components within normal limits  COMPREHENSIVE METABOLIC PANEL - Abnormal; Notable for the following components:   Glucose, Bld 264 (*)    BUN 30 (*)    Creatinine, Ser 1.80 (*)    Total Protein 6.4 (*)    ALT 13 (*)    GFR calc non Af Amer 32 (*)    GFR calc Af Amer 38 (*)    All other components within normal limits  I-STAT CHEM 8, ED - Abnormal; Notable for the following components:   BUN 30 (*)    Creatinine, Ser 1.70 (*)    Glucose, Bld 257 (*)    All other components within normal limits  I-STAT BETA HCG BLOOD, ED ([facility], [facility], [facility] ONLY) - Abnormal; Notable for the following components:   I-stat hCG, quantitative 10.5 (*)    All other components within normal limits  PROTIME-INR  APTT  DIFFERENTIAL  I-STAT TROPONIN, ED  CBG MONITORING, ED    EKG EKG Interpretation  Date/Time:  Sunday May 20 2017 14:34:28 EDT Ventricular Rate:  92 PR Interval:    QRS Duration: 88 QT Interval:  325 QTC Calculation: 405 R Axis:   80 Text Interpretation:  Atrial fibrillation Nonspecific repol abnormality, lateral leads No significant change since last tracing Significant baseline artifact  Confirmed by Duffy Bruce [id number]) on 05/20/2017 7:32:49 PM   Radiology Mr Brain Wo Contrast  Result Date: 05/20/2017 CLINICAL DATA:  Right-sided facial and arm numbness with tingling. No weakness. EXAM: MRI HEAD WITHOUT CONTRAST TECHNIQUE: Multiplanar, multiecho pulse sequences of the brain and surrounding structures were obtained without intravenous contrast. COMPARISON:  Head CT from earlier today FINDINGS: Brain: No acute infarction, hemorrhage, hydrocephalus, extra-axial collection or mass lesion. Mild cerebral white matter disease with small patchy FLAIR hyperintensities bilaterally. Although demyelinating disease is considered given demographics and a degree of periventricular predilection, this is most likely chronic small vessel ischemia given patient's vascular risk factors. No infratentorial or juxta cortical signal abnormality. Vascular: Major flow voids are preserved Skull and upper cervical spine: No evidence of marrow lesion Sinuses/Orbits: Bilateral cataract resection.  Clear sinuses. IMPRESSION: 1. No acute finding including infarct. 2. Mild cerebral white matter disease, vascular risk factors favoring chronic small vessel ischemia. Electronically Signed   By: Monte Fantasia M.D.   On: 05/20/2017 14:58   Ct Head Code Stroke Wo Contrast`  Result Date: 05/20/2017 CLINICAL DATA:  Code stroke. RIGHT-sided face and arm numbness and weakness. Diabetic patient with end-stage renal disease. EXAM: CT HEAD WITHOUT CONTRAST TECHNIQUE: Contiguous axial images were obtained from the base of the skull through the vertex without intravenous contrast. COMPARISON:  None. FINDINGS: Brain: No evidence for acute infarction, hemorrhage, mass lesion, hydrocephalus, or extra-axial fluid. No significant atrophy. Mild hypoattenuation of white matter, likely chronic microvascular ischemic change. Partial empty sella. Vascular: Calcification of the cavernous internal carotid arteries consistent with  cerebrovascular atherosclerotic disease. No signs of intracranial large vessel occlusion. Skull: Calvarium intact. Sinuses/Orbits: No significant sinus disease. Dense RIGHT lenticular opacity. LEFT cataract extraction. Other: None. ASPECTS [facility] Stroke Program Early CT Score) - Ganglionic level infarction (caudate, lentiform nuclei, internal capsule, insula, M1-M3 cortex): 7 - Supraganglionic infarction (M4-M6 cortex): 3 Total score (0-10 with 10 being normal): 10 IMPRESSION: 1.  Negative exam 2. ASPECTS is 10. 3. These results were communicated to Dr. Lorraine Lax at 11:25 [place] 4/28/2019by text page via the [facility] messaging system. Electronically Signed   By: Staci Righter M.D.   On: 05/20/2017 11:38    Procedures Procedures (including critical care time)  Medications Ordered in ED Medications  0.9 %  sodium chloride infusion (has no administration in time range)  aspirin chewable tablet 324 mg (has no administration in time range)     Initial Impression / Assessment and Plan / ED Course  I have reviewed the triage vital signs and the nursing notes.  Pertinent labs & imaging results that were available during my care of the patient were reviewed by me and considered in my medical decision making (see chart for details).     47 year old female here with transient numbness of her right side. Sx now completely resolved. Pt was initially activated as a CODE STROKE, but cancelled per Dr. Lorraine Lax. Lab work unremarkable - lytes, CBC, EKG all unremarkable. CT Head negative. Per Dr. Lorraine Lax, MR obtained and is negative. DDx includes transient paresthesias, though TIA on DDx but less likely. Low-risk ABCD score. Per Neurology and shared decision making with pt, will switch to atorvastatin, continue ASA, and plan to work-up as outpt with urgent, but non-emergent, TIA evaluation. Return precautions given. Will d/c home. Pt understands risks/benefits, as well as indications for return.  Final Clinical Impressions(s)  / ED Diagnoses   Final diagnoses:  Numbness and tingling    ED Discharge Orders        Ordered    atorvastatin (LIPITOR) 40 MG tablet  Daily     05/20/17 1557       Duffy Bruce, MD 05/20/17 1935

## 2017-05-20 NOTE — Discharge Instructions (Addendum)
As we discussed, we are switching your PRAVASTATIN to ATORVASTATIN  Keep taking your ASPIRIN as prescribed  Call your doctor. You need a CAROTID ULTRASOUND and ECHOCARDIOGRAM in the next 1-2 weeks.

## 2017-05-20 NOTE — ED Notes (Signed)
ED Provider at bedside. 

## 2017-05-20 NOTE — ED Notes (Signed)
Patient instructed to call 911 if any stroke symptoms return. Educated on stroke symptoms and patient acknowledged understanding.

## 2018-05-04 IMAGING — CT CT ABD-PELV W/O CM
2 of 3 series · 16 of 46 positions shown, 18 images · non-contrast
Comparison: CT abdomen pelvis 08/16/2012

CLINICAL DATA: Right flank pain. History of right kidney transplant
2 years ago.

EXAM:
CT ABDOMEN AND PELVIS WITHOUT CONTRAST
TECHNIQUE: Multidetector CT imaging of the abdomen and pelvis was performed
following the standard protocol without IV contrast.

[Series 4: lung · axial · 0.74mm/px · z∈[-142,-26]mm · 13 of 68 slices shown, 15 images]
[im 5/68  soft-tissue]
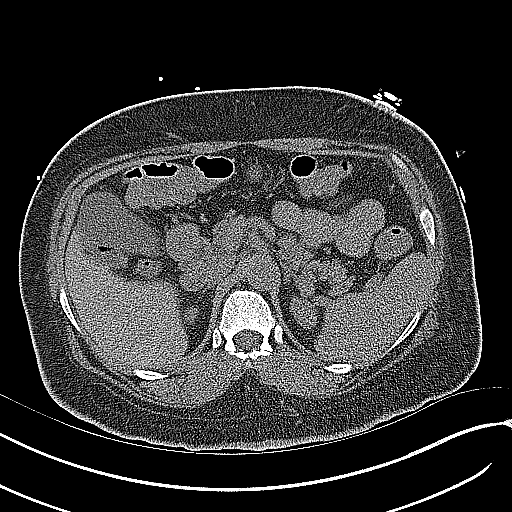
[im 5/68  bone]
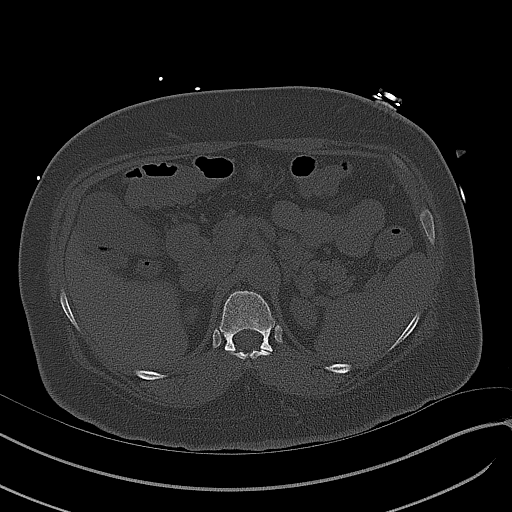
[im 9/68  soft-tissue]
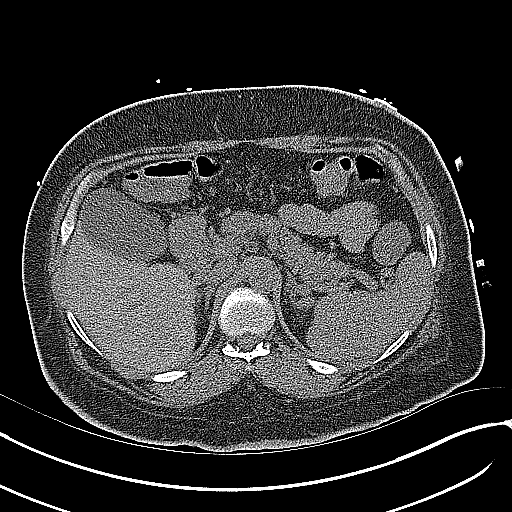
[im 13/68  soft-tissue]
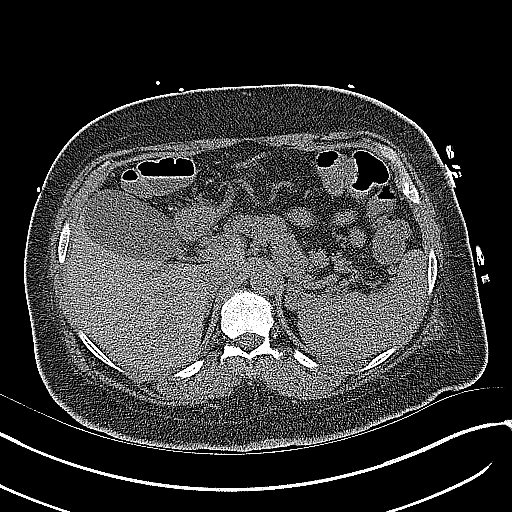
[im 20/68  soft-tissue]
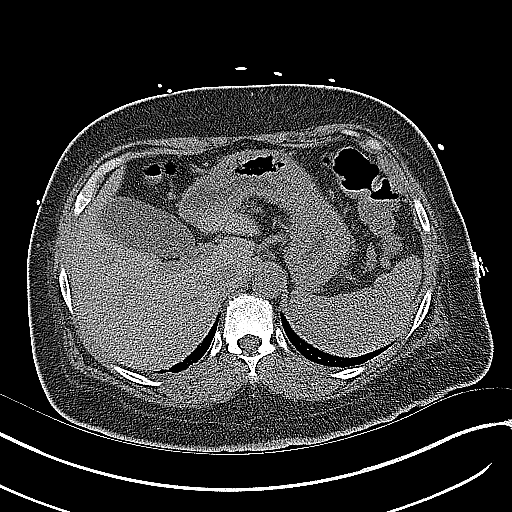
[im 24/68  soft-tissue]
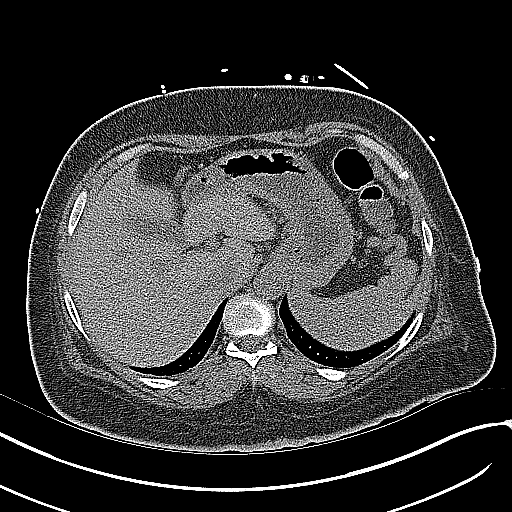
[im 29/68  soft-tissue]
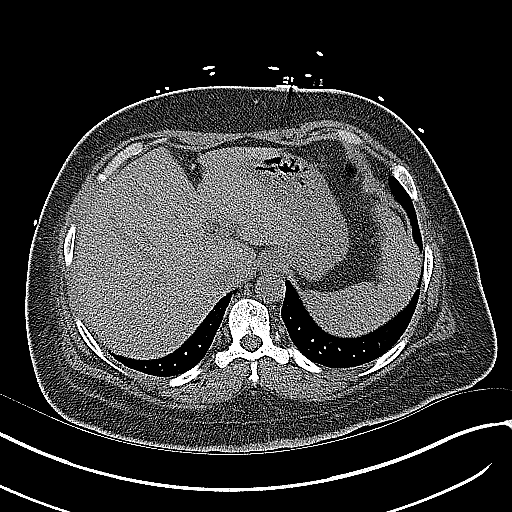
[im 35/68  soft-tissue]
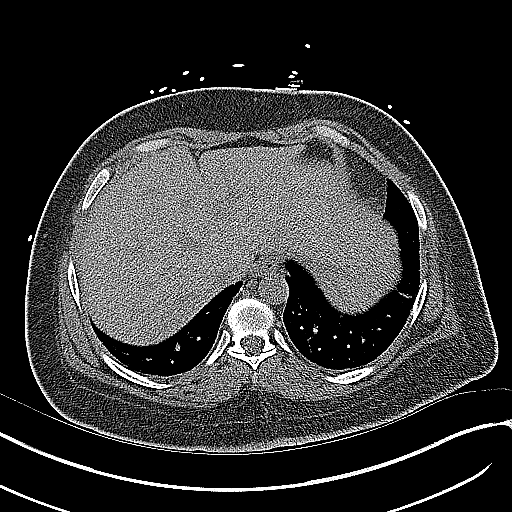
[im 39/68  soft-tissue]
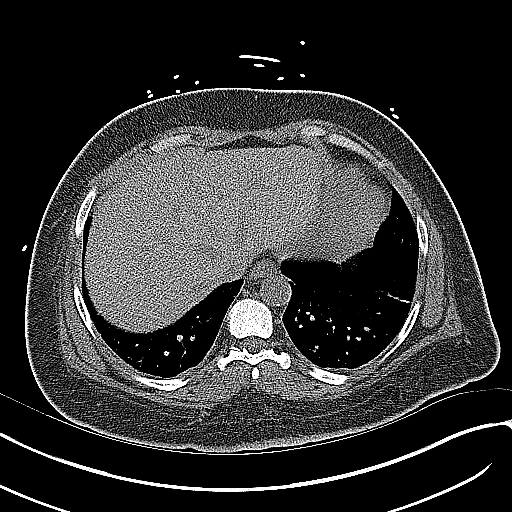
[im 44/68  soft-tissue]
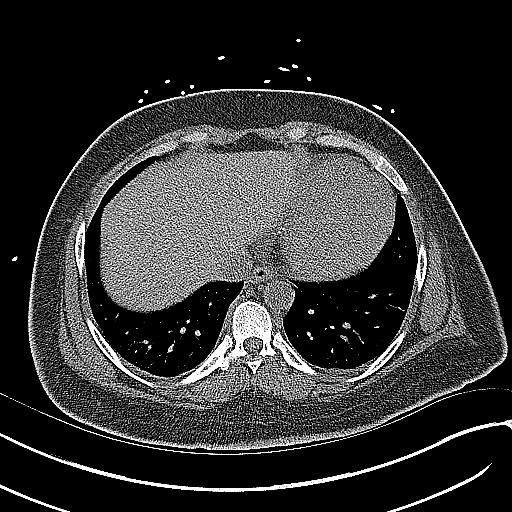
[im 44/68  bone]
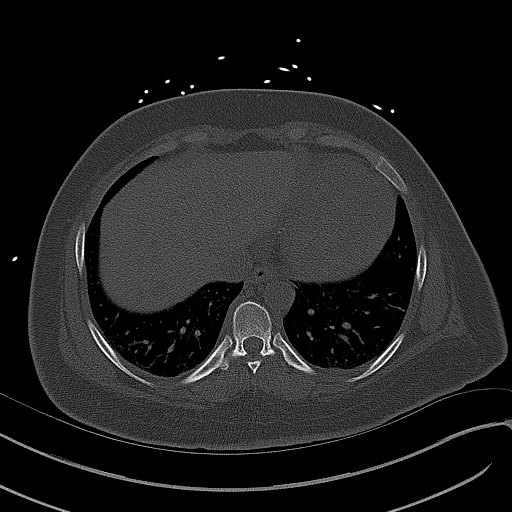
[im 48/68  soft-tissue]
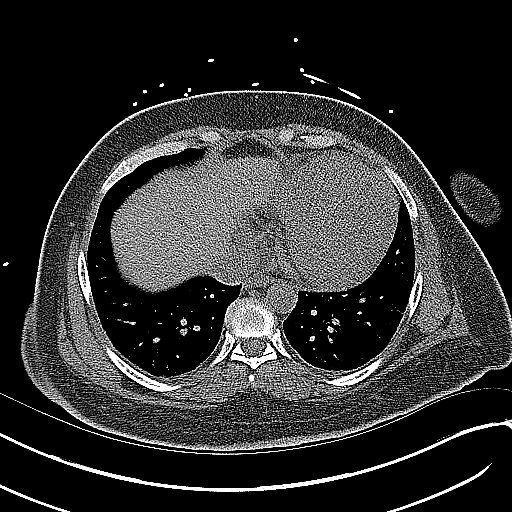
[im 55/68  soft-tissue]
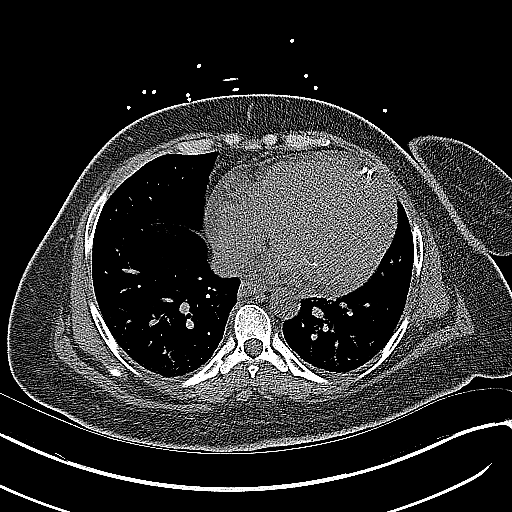
[im 59/68  soft-tissue]
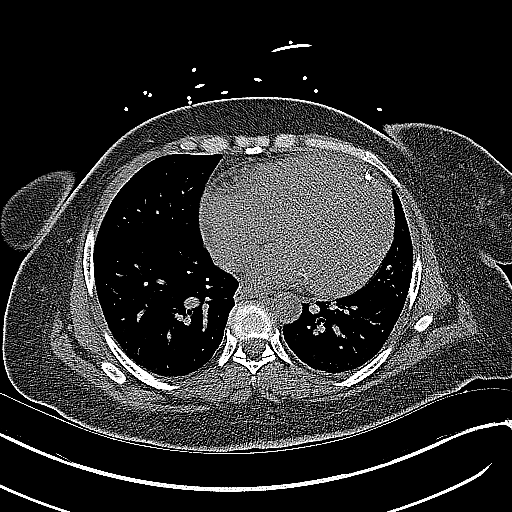
[im 63/68  soft-tissue]
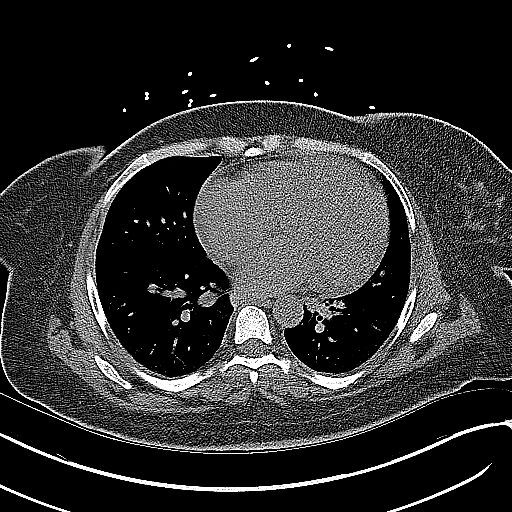

[Series 5: coronal · coronal · 0.74mm/px · 3 of 139 slices shown]
[im 47/139  soft-tissue]
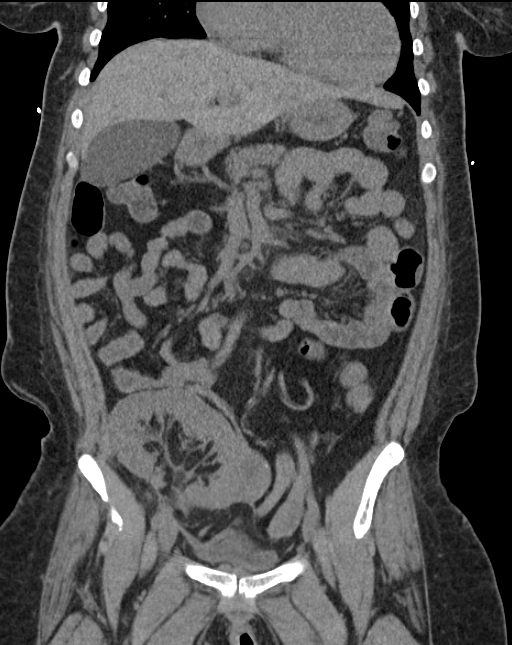
[im 62/139  soft-tissue]
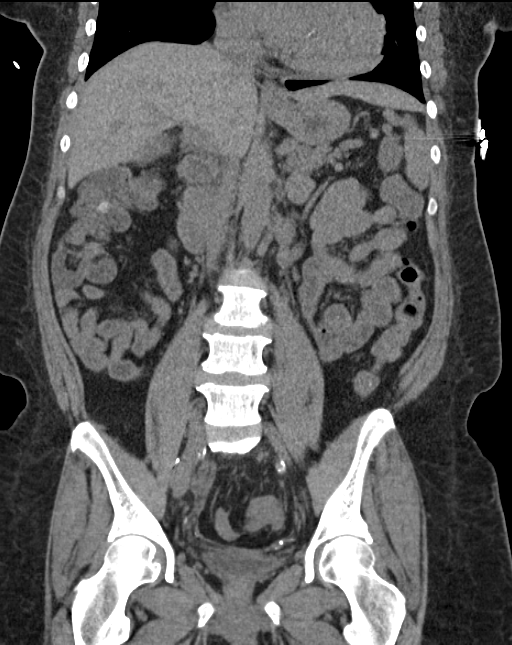
[im 77/139  soft-tissue]
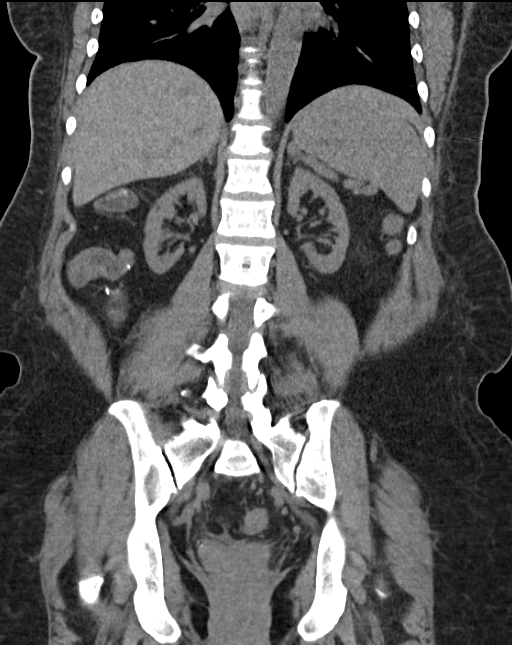

[16 of 46 positions shown; findings below may reference images not displayed]

FINDINGS: Lower chest:  No acute findings.

Hepatobiliary: No mass visualized on this un-enhanced exam.

Pancreas: No mass or inflammatory process identified on this
un-enhanced exam.

Spleen: Within normal limits in size.

Adrenals/Urinary Tract: The native kidneys have atrophic appearance.
There is a transplanted kidney in the right pelvis without evidence
of hydronephrosis or nephrolithiasis.

Stomach/Bowel: No evidence of obstruction, inflammatory process, or
abnormal fluid collections.

Vascular/Lymphatic: No pathologically enlarged lymph nodes. No
evidence of abdominal aortic aneurysm.

Reproductive: Post hysterectomy. A cystic structure in the mid
pelvis abutting the transplanted kidney, measuring 3.2 cm likely
represents the right adnexa containing a 3.1 cm cyst. Urinary
bladder is decompressed and therefore incompletely evaluated.

Other: Fat stranding in the lower anterior abdominal wall mass
likely postsurgical.

Musculoskeletal: No suspicious bone lesions identified. Stigmata of
renal osteodystrophy of the thoracic spine noted.
IMPRESSION: Right pelvic kidney transplant without evidence of hydronephrosis or
nephrolithiasis.

Probable 3.1 cm right adnexal cyst, displaced to the central pelvis
by in the transplanted kidney.

Renal osteodystrophy of the thoracic spine.
